# Patient Record
Sex: Female | Born: 1945 | Race: White | Hispanic: No | Smoking: Never smoker
Health system: Southern US, Community
[De-identification: ages and names within clinical notes are randomized; demographics above are authoritative.]

## PROBLEM LIST (undated history)

## (undated) DIAGNOSIS — Z9889 Other specified postprocedural states: Secondary | ICD-10-CM

## (undated) DIAGNOSIS — C801 Malignant (primary) neoplasm, unspecified: Secondary | ICD-10-CM

## (undated) DIAGNOSIS — I10 Essential (primary) hypertension: Secondary | ICD-10-CM

## (undated) HISTORY — PX: CERVICAL SPINE SURGERY: SHX589

## (undated) HISTORY — PX: HAMMER TOE SURGERY: SHX385

## (undated) HISTORY — PX: KNEE SURGERY: SHX244

## (undated) HISTORY — PX: HIP SURGERY: SHX245

---

## 2015-10-14 DIAGNOSIS — M179 Osteoarthritis of knee, unspecified: Secondary | ICD-10-CM | POA: Insufficient documentation

## 2015-10-14 DIAGNOSIS — M1710 Unilateral primary osteoarthritis, unspecified knee: Secondary | ICD-10-CM | POA: Insufficient documentation

## 2017-11-09 DIAGNOSIS — M2011 Hallux valgus (acquired), right foot: Secondary | ICD-10-CM | POA: Insufficient documentation

## 2018-02-05 DIAGNOSIS — M79671 Pain in right foot: Secondary | ICD-10-CM | POA: Insufficient documentation

## 2018-12-10 ENCOUNTER — Ambulatory Visit (INDEPENDENT_AMBULATORY_CARE_PROVIDER_SITE_OTHER): Payer: Medicare Other

## 2018-12-10 ENCOUNTER — Other Ambulatory Visit: Payer: Self-pay | Admitting: Podiatry

## 2018-12-10 ENCOUNTER — Other Ambulatory Visit: Payer: Self-pay

## 2018-12-10 ENCOUNTER — Encounter: Payer: Self-pay | Admitting: Podiatry

## 2018-12-10 ENCOUNTER — Ambulatory Visit (INDEPENDENT_AMBULATORY_CARE_PROVIDER_SITE_OTHER): Payer: Medicare Other | Admitting: Podiatry

## 2018-12-10 VITALS — BP 165/83 | HR 76 | Resp 16

## 2018-12-10 DIAGNOSIS — M205X1 Other deformities of toe(s) (acquired), right foot: Secondary | ICD-10-CM

## 2018-12-10 DIAGNOSIS — M79672 Pain in left foot: Secondary | ICD-10-CM | POA: Diagnosis not present

## 2018-12-10 DIAGNOSIS — M2041 Other hammer toe(s) (acquired), right foot: Secondary | ICD-10-CM

## 2018-12-10 DIAGNOSIS — M779 Enthesopathy, unspecified: Secondary | ICD-10-CM

## 2018-12-10 DIAGNOSIS — M79671 Pain in right foot: Secondary | ICD-10-CM

## 2018-12-10 DIAGNOSIS — M205X2 Other deformities of toe(s) (acquired), left foot: Secondary | ICD-10-CM

## 2018-12-10 DIAGNOSIS — S92309A Fracture of unspecified metatarsal bone(s), unspecified foot, initial encounter for closed fracture: Secondary | ICD-10-CM | POA: Insufficient documentation

## 2018-12-10 NOTE — Patient Instructions (Signed)
Bunion  A bunion is a bump on the base of the big toe that forms when the bones of the big toe joint move out of position. Bunions may be small at first, but they often get larger over time. They can make walking painful. What are the causes? A bunion may be caused by:  Wearing narrow or pointed shoes that force the big toe to press against the other toes.  Abnormal foot development that causes the foot to roll inward (pronate).  Changes in the foot that are caused by certain diseases, such as rheumatoid arthritis or polio.  A foot injury. What increases the risk? The following factors may make you more likely to develop this condition:  Wearing shoes that squeeze the toes together.  Having certain diseases, such as: ? Rheumatoid arthritis. ? Polio. ? Cerebral palsy.  Having family members who have bunions.  Being born with a foot deformity, such as flat feet or low arches.  Doing activities that put a lot of pressure on the feet, such as ballet dancing. What are the signs or symptoms? The main symptom of a bunion is a noticeable bump on the big toe. Other symptoms may include:  Pain.  Swelling around the big toe.  Redness and inflammation.  Thick or hardened skin on the big toe or between the toes.  Stiffness or loss of motion in the big toe.  Trouble with walking. How is this diagnosed? A bunion may be diagnosed based on your symptoms, medical history, and activities. You may have tests, such as:  X-rays. These allow your health care provider to check the position of the bones in your foot and look for damage to your joint. They also help your health care provider determine the severity of your bunion and the best way to treat it.  Joint aspiration. In this test, a sample of fluid is removed from the toe joint. This test may be done if you are in a lot of pain. It helps rule out diseases that cause painful swelling of the joints, such as arthritis. How is this  treated? Treatment depends on the severity of your symptoms. The goal of treatment is to relieve symptoms and prevent the bunion from getting worse. Your health care provider may recommend:  Wearing shoes that have a wide toe box.  Using bunion pads to cushion the affected area.  Taping your toes together to keep them in a normal position.  Placing a device inside your shoe (orthotics) to help reduce pressure on your toe joint.  Taking medicine to ease pain, inflammation, and swelling.  Applying heat or ice to the affected area.  Doing stretching exercises.  Surgery to remove scar tissue and move the toes back into their normal position. This treatment is rare. Follow these instructions at home: Managing pain, stiffness, and swelling   If directed, put ice on the painful area: ? Put ice in a plastic bag. ? Place a towel between your skin and the bag. ? Leave the ice on for 20 minutes, 2-3 times a day. Activity   If directed, apply heat to the affected area before you exercise. Use the heat source that your health care provider recommends, such as a moist heat pack or a heating pad. ? Place a towel between your skin and the heat source. ? Leave the heat on for 20-30 minutes. ? Remove the heat if your skin turns bright red. This is especially important if you are unable to feel pain,   heat, or cold. You may have a greater risk of getting burned.  Do exercises as told by your health care provider. General instructions  Support your toe joint with proper footwear, shoe padding, or taping as told by your health care provider.  Take over-the-counter and prescription medicines only as told by your health care provider.  Keep all follow-up visits as told by your health care provider. This is important. Contact a health care provider if your symptoms:  Get worse.  Do not improve in 2 weeks. Get help right away if you have:  Severe pain and trouble with walking. Summary  A  bunion is a bump on the base of the big toe that forms when the bones of the big toe joint move out of position.  Bunions can make walking painful.  Treatment depends on the severity of your symptoms.  Support your toe joint with proper footwear, shoe padding, or taping as told by your health care provider. This information is not intended to replace advice given to you by your health care provider. Make sure you discuss any questions you have with your health care provider. Document Released: 02/21/2005 Document Revised: 08/28/2017 Document Reviewed: 07/04/2017 Elsevier Patient Education  2020 [employer]  

## 2018-12-10 NOTE — Progress Notes (Signed)
   Subjective:    Patient ID: Tonya Jenkins, female    DOB: 1945-11-16, 73 y.o.   MRN: [id number]  HPI    Review of Systems  All other systems reviewed and are negative.      Objective:   Physical Exam        Assessment & Plan:

## 2018-12-16 NOTE — Progress Notes (Signed)
Subjective:   Patient ID: Tonya Jenkins, female   DOB: 73 y.o.   MRN: [id number]   HPI Patient presents states she had bunion surgery done last year and her foot still is bothering her on the right foot and she has moderate bunion deformity left and she is concerned about what could be done for this right one states it does deviate against her second toe.  Patient does not smoke likes to be active   Review of Systems  All other systems reviewed and are negative.       Objective:  Physical Exam Vitals signs and nursing note reviewed.  Constitutional:      Appearance: She is well-developed.  Pulmonary:     Effort: Pulmonary effort is normal.  Musculoskeletal: Normal range of motion.  Skin:    General: Skin is warm.  Neurological:     Mental Status: She is alert.     Neurovascular status found to be intact muscle strength found to be adequate range of motion within normal limits with patient noted to have reduced range of motion first MPJ right with inflammation fluid around the big toe joint and moderate valgus deformity left with deviation of the hallux against second toe bilateral.  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

## 2018-12-28 ENCOUNTER — Other Ambulatory Visit: Payer: Self-pay

## 2018-12-28 DIAGNOSIS — Z20822 Contact with and (suspected) exposure to covid-19: Secondary | ICD-10-CM

## 2018-12-29 LAB — NOVEL CORONAVIRUS, NAA: SARS-CoV-2, NAA: NOT DETECTED

## 2019-01-07 ENCOUNTER — Other Ambulatory Visit: Payer: Self-pay

## 2019-01-07 ENCOUNTER — Encounter: Payer: Self-pay | Admitting: Podiatry

## 2019-01-07 ENCOUNTER — Ambulatory Visit (INDEPENDENT_AMBULATORY_CARE_PROVIDER_SITE_OTHER): Payer: Medicare Other | Admitting: Podiatry

## 2019-01-07 DIAGNOSIS — M21619 Bunion of unspecified foot: Secondary | ICD-10-CM | POA: Diagnosis not present

## 2019-01-07 DIAGNOSIS — M205X1 Other deformities of toe(s) (acquired), right foot: Secondary | ICD-10-CM

## 2019-01-07 DIAGNOSIS — M7752 Other enthesopathy of left foot: Secondary | ICD-10-CM

## 2019-01-07 DIAGNOSIS — M779 Enthesopathy, unspecified: Secondary | ICD-10-CM

## 2019-01-07 NOTE — Progress Notes (Signed)
Subjective:   Patient ID: Tonya Jenkins, female   DOB: 73 y.o.   MRN: [id number]   HPI Patient presents stating that the right great toe still really bothering her and the injection we did give her temporary relief but it is reoccurred and it feels like she just cannot bend the toe.  She did have surgery done in [place] 1 year ago   ROS      Objective:  Physical Exam  Neurovascular status intact with patient found to have discomfort more on the medial side of the joint of the right first MPJ with reduced range of motion and history of surgery     Assessment:  Inflammatory capsulitis with probable hallux limitus deformity right with undercorrected bunion done in [place] 1 year ago     Plan:  H&P reviewed condition and today on trying the medial injection 3 mg Kenalog 5 mg Xylocaine discussed the possibility that we may end up having to do a fusion of the first MPJ and did place into a graphite insole along with over-the-counter insole to try to provide for better support.  Patient will be seen back for [place] to recheck again in the next several weeks or earlier if any issues were to occur

## 2019-02-05 ENCOUNTER — Telehealth: Payer: Self-pay | Admitting: *Deleted

## 2019-02-05 NOTE — Telephone Encounter (Signed)
I informed pt that if she asked Dr. Paulla Dolly at her office visit, he would have it available for her to pick up at checkout and take to the [facility]. Pt states she has printed up and completed the paperwork and will bring to appt.

## 2019-02-05 NOTE — Telephone Encounter (Signed)
Pt states she has an appt tomorrow with Dr. Paulla Dolly and would like a handicap placard.

## 2019-02-06 ENCOUNTER — Ambulatory Visit (INDEPENDENT_AMBULATORY_CARE_PROVIDER_SITE_OTHER): Payer: Medicare Other | Admitting: Podiatry

## 2019-02-06 ENCOUNTER — Other Ambulatory Visit: Payer: Self-pay

## 2019-02-06 ENCOUNTER — Encounter: Payer: Self-pay | Admitting: Podiatry

## 2019-02-06 DIAGNOSIS — G629 Polyneuropathy, unspecified: Secondary | ICD-10-CM

## 2019-02-06 DIAGNOSIS — M779 Enthesopathy, unspecified: Secondary | ICD-10-CM | POA: Diagnosis not present

## 2019-02-06 DIAGNOSIS — M205X1 Other deformities of toe(s) (acquired), right foot: Secondary | ICD-10-CM

## 2019-02-06 MED ORDER — GABAPENTIN 100 MG PO CAPS: 100.0000 mg | ORAL_CAPSULE | Freq: Every day | ORAL | 3 refills | Status: DC

## 2019-02-06 NOTE — Progress Notes (Signed)
Subjective:   Patient ID: Tonya Jenkins, female   DOB: 73 y.o.   MRN: [id number]   HPI Patient states my foot is some better but I do get at nighttime burning in it and it can wake me up and it seems like that is when I am having my most problems   ROS      Objective:  Physical Exam  Neurovascular status intact with patient's right first MPJ not showing current crepitus with patient noted to have moderate inflammation around the first MPJ and what appears to be more of a vague nerve type pain which may be neuropathy     Assessment:  Difficult to say pain whether it is related to inflammation or possible neuropathic change     Plan:  H&P reviewed condition and at this point I did go ahead and I am going to start her on low-dose gabapentin just at night to see if it helps her with sleep and to stop some of the burning pain she is experiencing do not recommend current surgery but that may be necessary at one point in future and I reviewed that with her

## 2019-03-07 ENCOUNTER — Other Ambulatory Visit: Payer: Self-pay

## 2019-03-07 ENCOUNTER — Emergency Department (HOSPITAL_COMMUNITY)
Admission: EM | Admit: 2019-03-07 | Discharge: 2019-03-07 | Disposition: A | Discharge status: Home / Self Care | Payer: No Typology Code available for payment source | Attending: Emergency Medicine | Admitting: Emergency Medicine

## 2019-03-07 ENCOUNTER — Emergency Department (HOSPITAL_COMMUNITY): Payer: No Typology Code available for payment source

## 2019-03-07 ENCOUNTER — Encounter (HOSPITAL_COMMUNITY): Payer: Self-pay | Admitting: Emergency Medicine

## 2019-03-07 DIAGNOSIS — Z888 Allergy status to other drugs, medicaments and biological substances status: Secondary | ICD-10-CM | POA: Diagnosis not present

## 2019-03-07 DIAGNOSIS — R0789 Other chest pain: Secondary | ICD-10-CM | POA: Diagnosis not present

## 2019-03-07 DIAGNOSIS — M542 Cervicalgia: Secondary | ICD-10-CM | POA: Insufficient documentation

## 2019-03-07 DIAGNOSIS — Z880 Allergy status to penicillin: Secondary | ICD-10-CM | POA: Insufficient documentation

## 2019-03-07 DIAGNOSIS — I10 Essential (primary) hypertension: Secondary | ICD-10-CM | POA: Diagnosis not present

## 2019-03-07 DIAGNOSIS — R519 Headache, unspecified: Secondary | ICD-10-CM | POA: Diagnosis not present

## 2019-03-07 DIAGNOSIS — M25552 Pain in left hip: Secondary | ICD-10-CM | POA: Insufficient documentation

## 2019-03-07 HISTORY — DX: Other specified postprocedural states: Z98.890

## 2019-03-07 HISTORY — DX: Essential (primary) hypertension: I10
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
## 2019-03-07 MED ORDER — BACLOFEN 10 MG PO TABS: 10.0000 mg | ORAL_TABLET | Freq: Three times a day (TID) | ORAL | 0 refills | Status: DC

## 2019-03-07 MED ORDER — ACETAMINOPHEN 325 MG PO TABS
650.0000 mg | ORAL_TABLET | Freq: Once | ORAL | Status: AC
  Administered 2019-03-07: 650 mg via ORAL
  Filled 2019-03-07: qty 2

## 2019-03-07 MED ORDER — DIAZEPAM 2 MG PO TABS
2.0000 mg | ORAL_TABLET | Freq: Once | ORAL | Status: AC
  Administered 2019-03-07: 2 mg via ORAL
  Filled 2019-03-07: qty 1
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
## 2019-03-07 NOTE — Discharge Instructions (Signed)
Please refer to the attached instructions 

## 2019-06-14 ENCOUNTER — Ambulatory Visit: Payer: Self-pay | Admitting: *Deleted

## 2019-06-14 NOTE — Telephone Encounter (Signed)
Pt called to inquire information regarding getting 2nd COVID vaccine while she is having throat discomfort, chest tightness, and fatigue; the pt says it could be related to the pollen; she does not have a PCP; the pt was advised to #1 seek medical evaluation at [facility] or ED; and #2 in order to have vaccine she must be able to do the following: ALL conditions listed below are met: - at least 10 days since symptoms onset - AND 3 consecutive days fever free without antipyretics (acetaminophen [Tylenol] or ibuprofen [Advil]) - AND improvement in respiratory symptoms   she verbalized understanding. -  Reason for Disposition . Question about upcoming scheduled test, no triage required and triager able to answer question  Answer Assessment - Initial Assessment Questions 1. REASON FOR CALL or QUESTION: "What is your reason for calling today?" or "How can I best help you?" or "What question do you have that I can help answer?"     2nd COVID vaccine and testing  Protocols used: INFORMATION ONLY CALL-A-AH

## 2019-12-28 ENCOUNTER — Emergency Department (HOSPITAL_COMMUNITY)
Admission: EM | Admit: 2019-12-28 | Discharge: 2019-12-28 | Disposition: A | Discharge status: Home / Self Care | Payer: Medicare Other | Attending: Emergency Medicine | Admitting: Emergency Medicine

## 2019-12-28 ENCOUNTER — Encounter (HOSPITAL_COMMUNITY): Payer: Self-pay

## 2019-12-28 ENCOUNTER — Emergency Department (HOSPITAL_COMMUNITY): Payer: Medicare Other

## 2019-12-28 ENCOUNTER — Other Ambulatory Visit: Payer: Self-pay

## 2019-12-28 DIAGNOSIS — M4726 Other spondylosis with radiculopathy, lumbar region: Secondary | ICD-10-CM

## 2019-12-28 DIAGNOSIS — R200 Anesthesia of skin: Secondary | ICD-10-CM | POA: Insufficient documentation

## 2019-12-28 DIAGNOSIS — R531 Weakness: Secondary | ICD-10-CM | POA: Diagnosis not present

## 2019-12-28 DIAGNOSIS — M5441 Lumbago with sciatica, right side: Secondary | ICD-10-CM | POA: Diagnosis not present

## 2019-12-28 DIAGNOSIS — I10 Essential (primary) hypertension: Secondary | ICD-10-CM | POA: Insufficient documentation

## 2019-12-28 DIAGNOSIS — M545 Low back pain, unspecified: Secondary | ICD-10-CM | POA: Diagnosis present

## 2019-12-28 IMAGING — CR DG HIP (WITH OR WITHOUT PELVIS) 2-3V*R*
3 series · 3 of 3 positions shown · non-contrast
Comparison: None.

CLINICAL DATA: Fall, hip pain

EXAM:
DG HIP (WITH OR WITHOUT PELVIS) 2-3V RIGHT

[t pelvis ap]
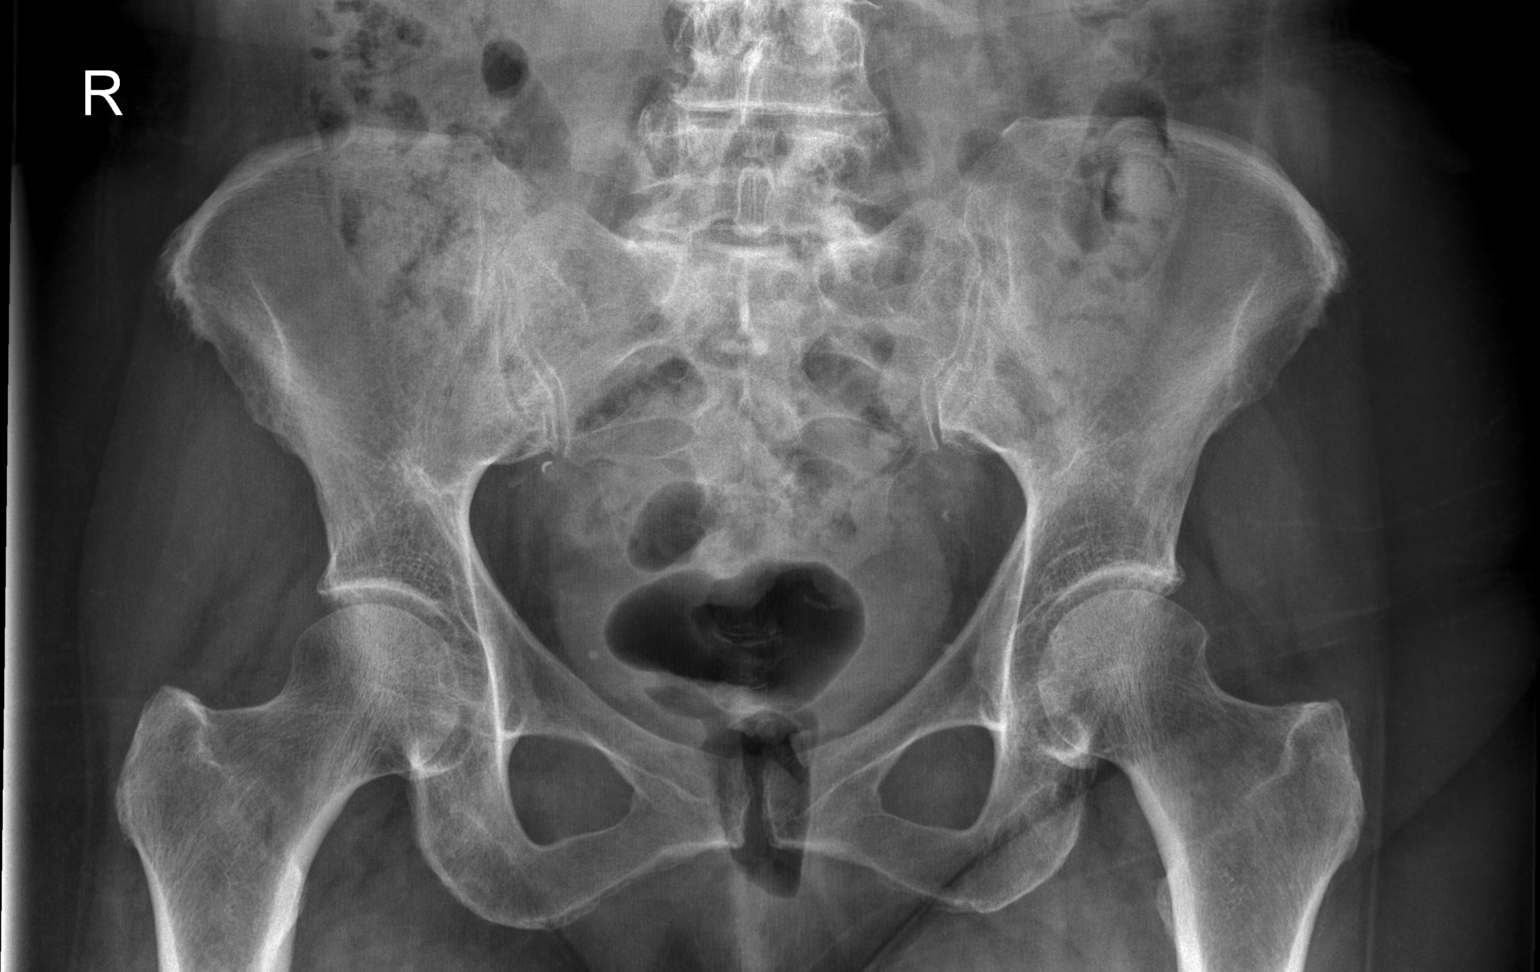

[t hip ap right]
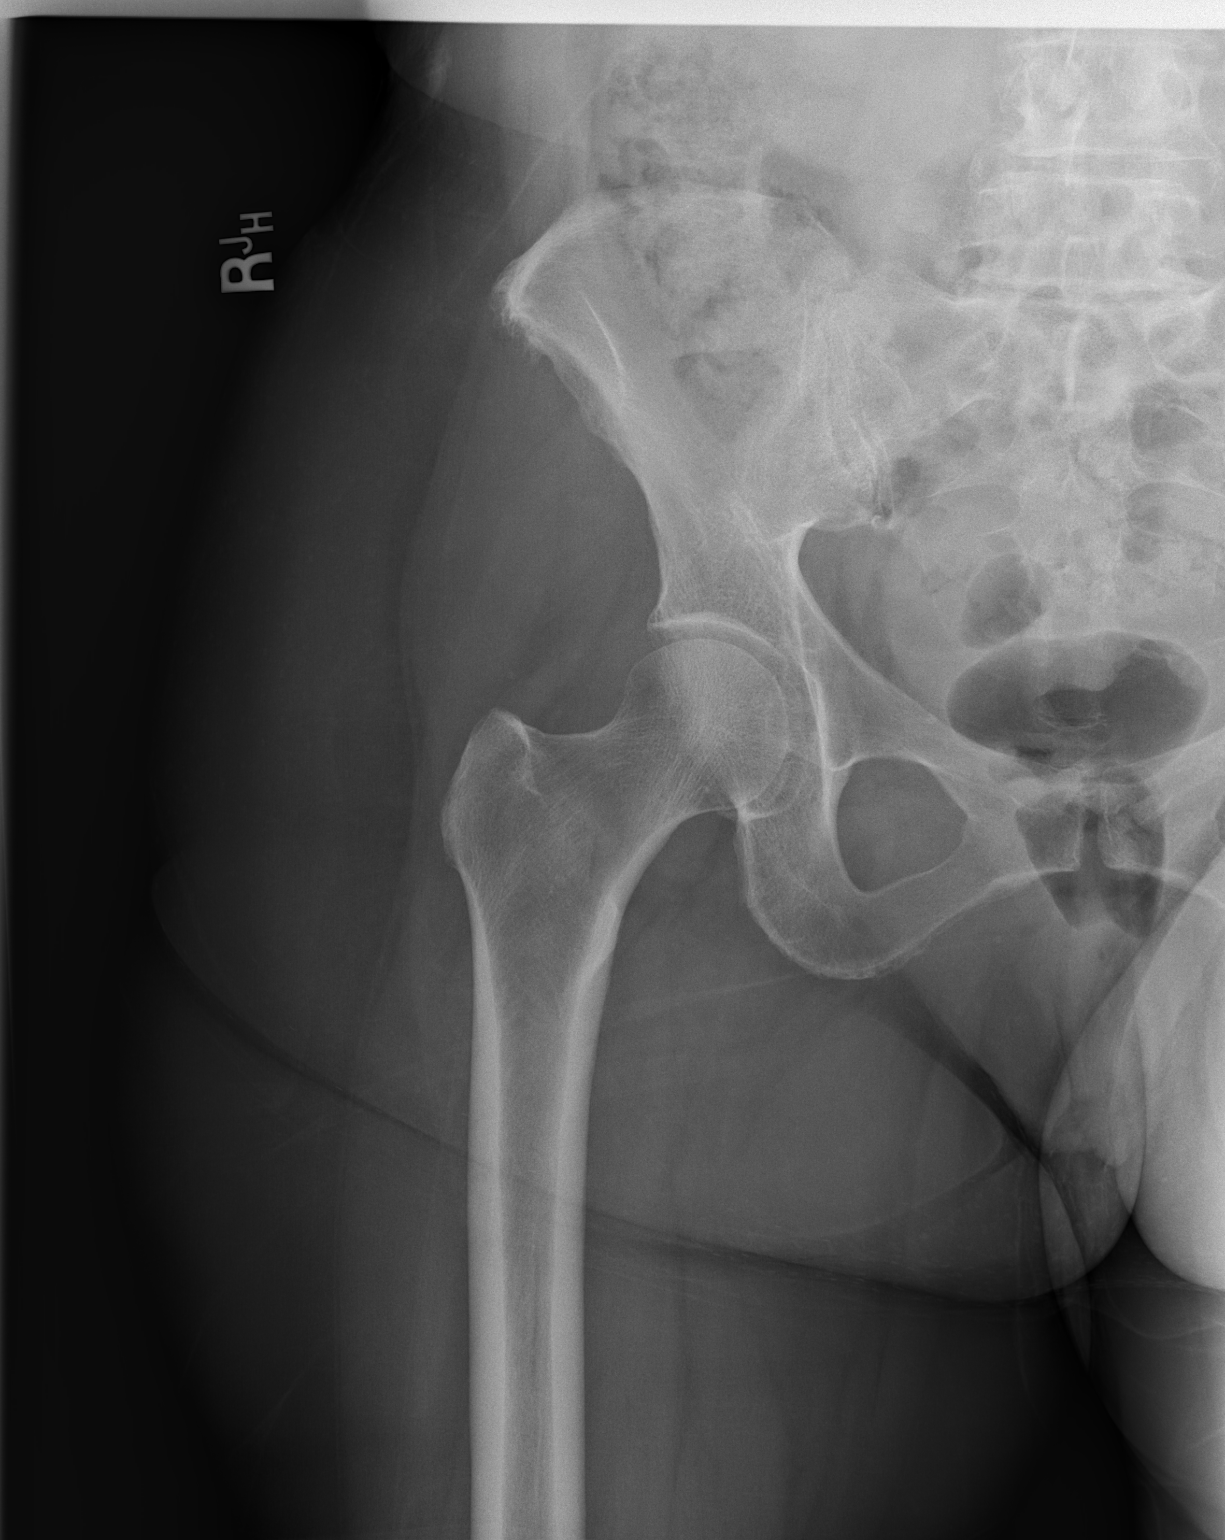

[t hip frog leg right]
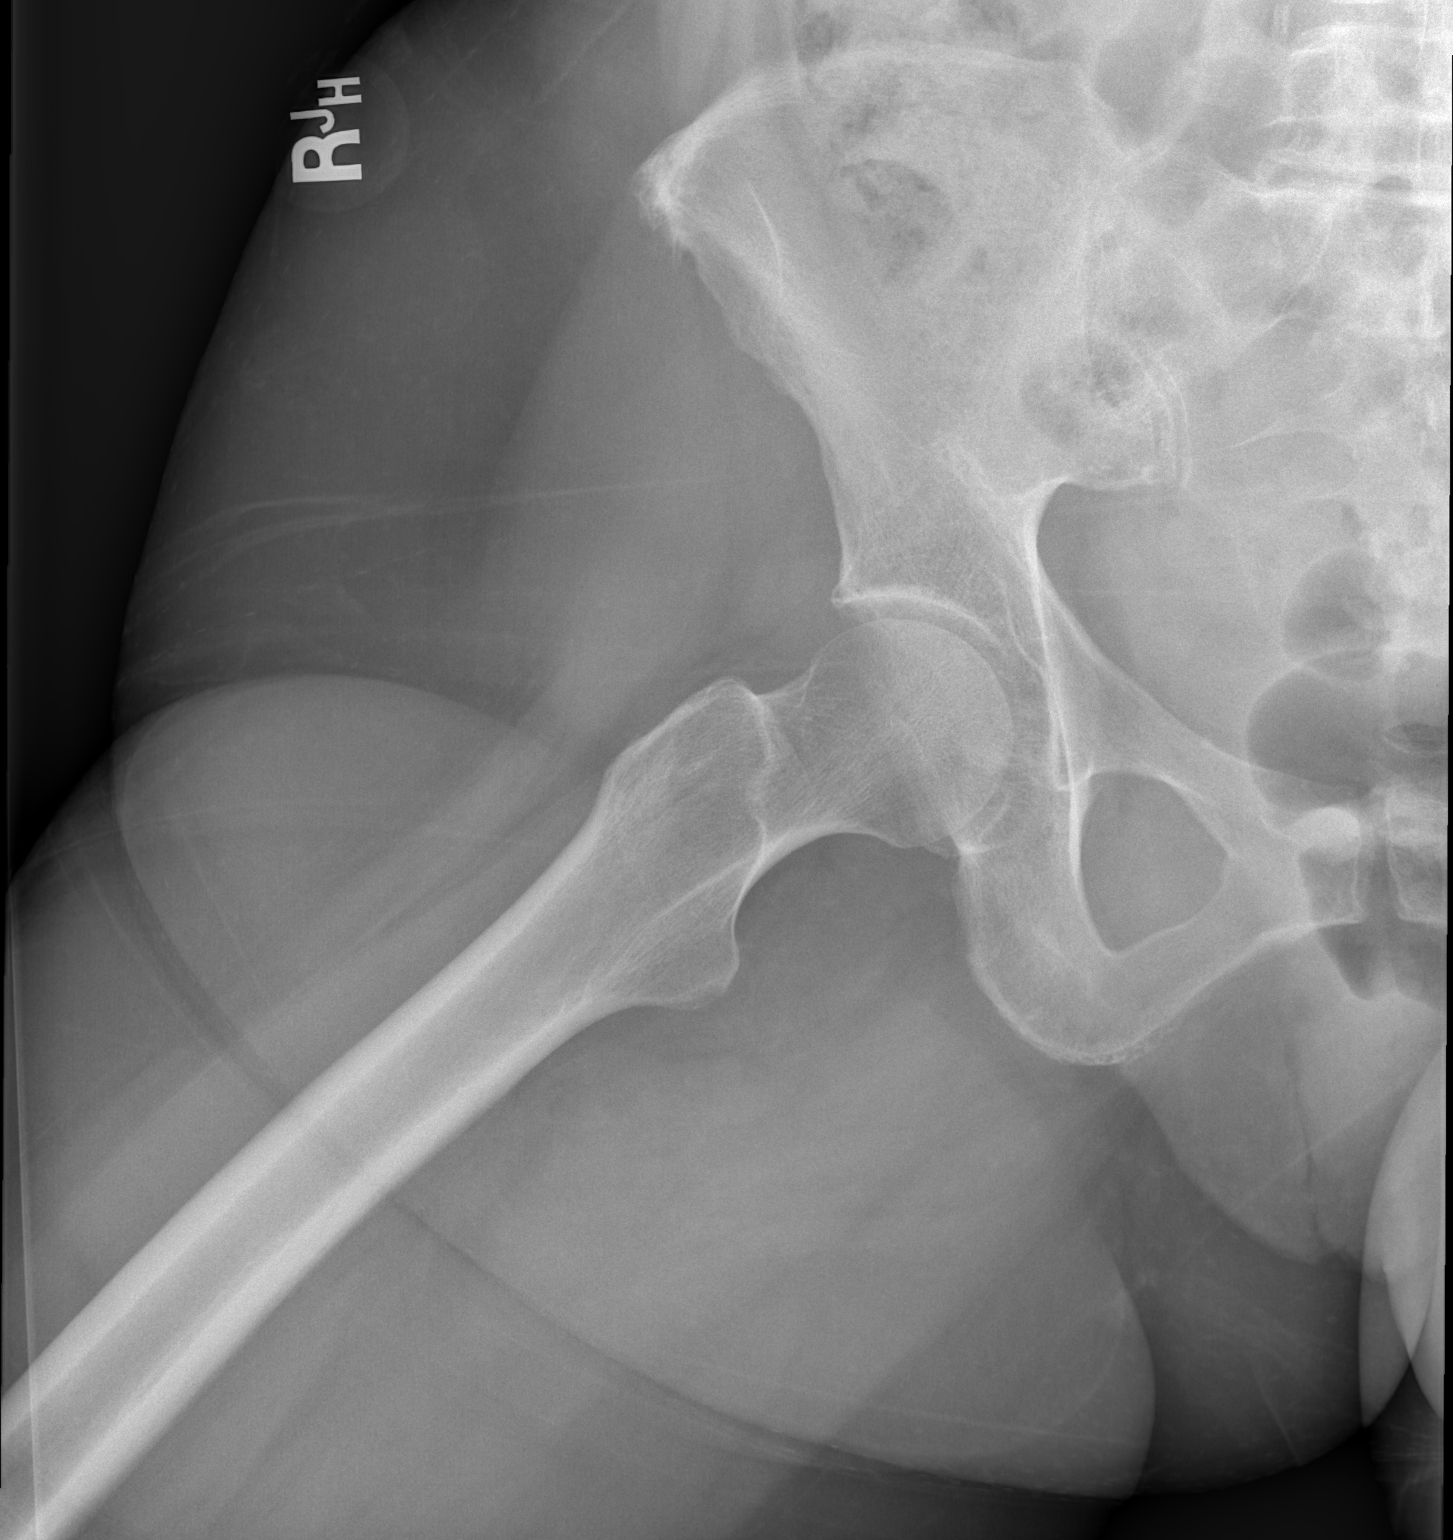

[3 of 3 positions shown; findings below may reference images not displayed]

FINDINGS: There is no evidence of hip fracture or dislocation. Moderate
bilateral hip osteoarthritis is seen with superior joint space loss
and marginal osteophyte formation. The sacroiliac joints appear to
be intact. Degenerative changes in the lower lumbar spine.
IMPRESSION: No acute fracture seen.

## 2019-12-28 IMAGING — CR DG LUMBAR SPINE COMPLETE 4+V
5 series · 5 of 5 positions shown · non-contrast
Comparison: None.

CLINICAL DATA: Back pain after fall

EXAM:
LUMBAR SPINE - COMPLETE 4+ VIEW

[t lumbar spine lat (1 of 2)]
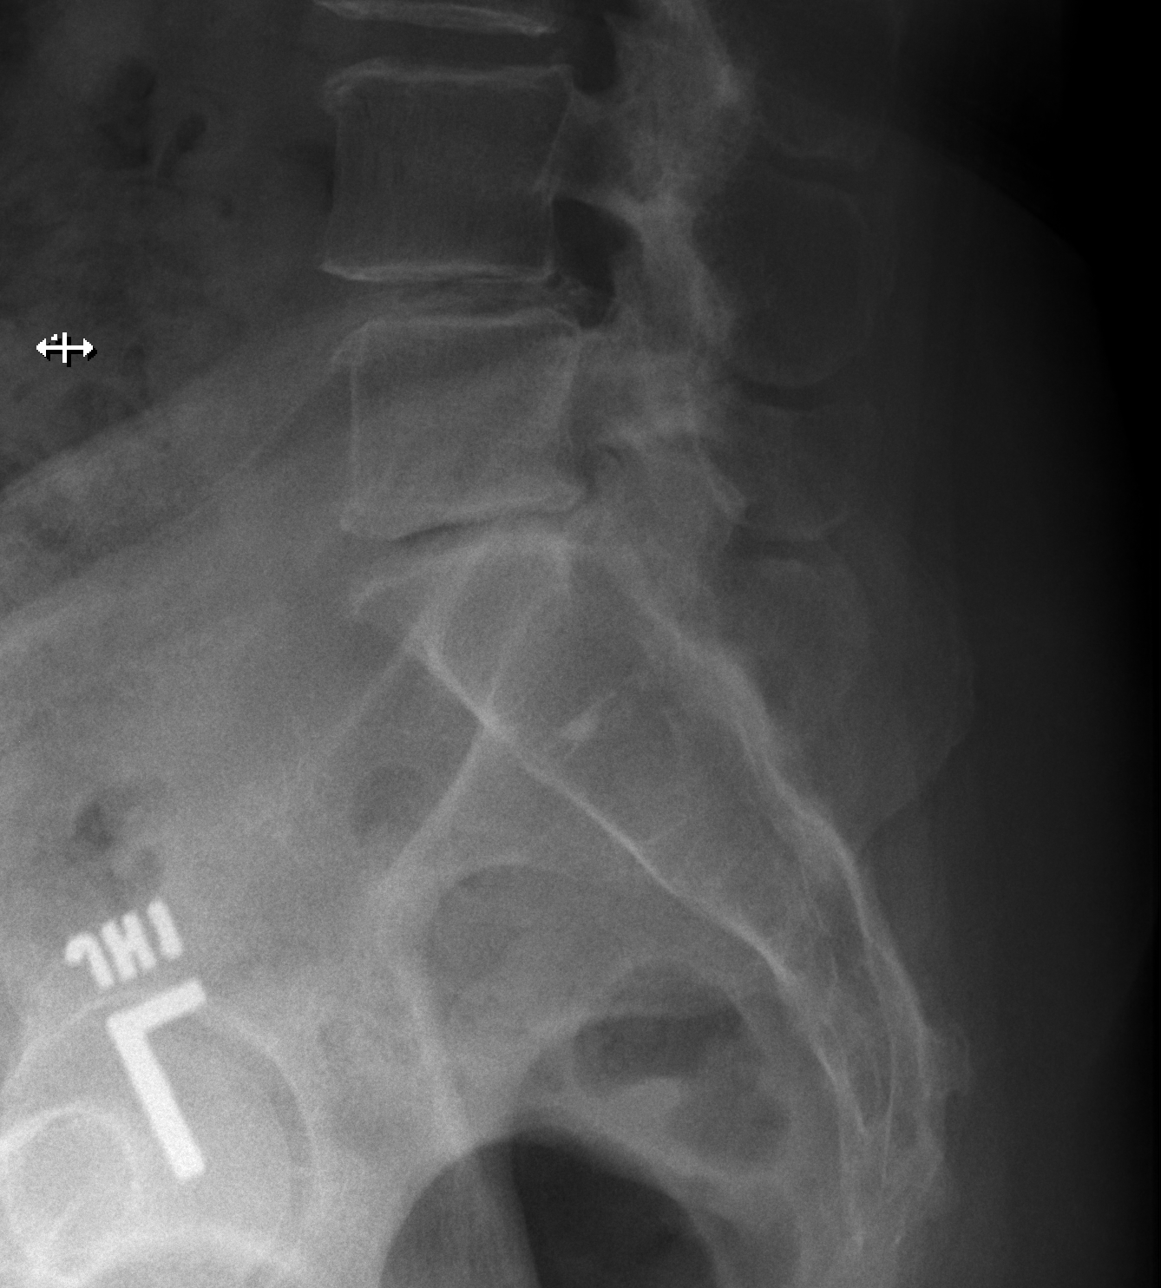

[t lumbar spine lat (2 of 2)]
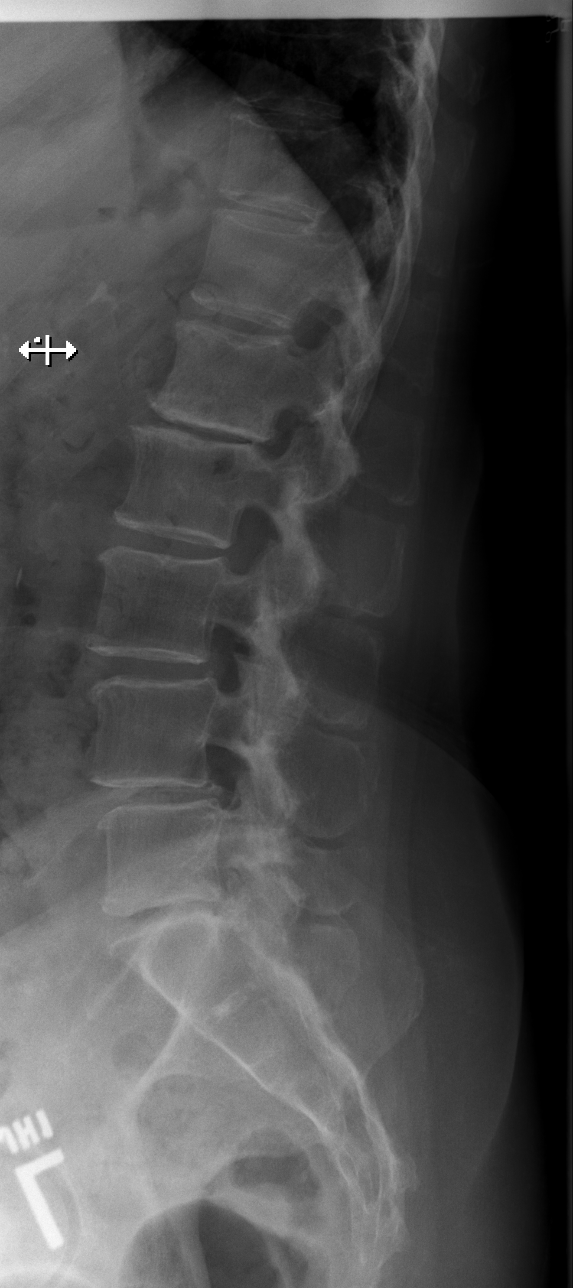

[t lumbar spine ap]
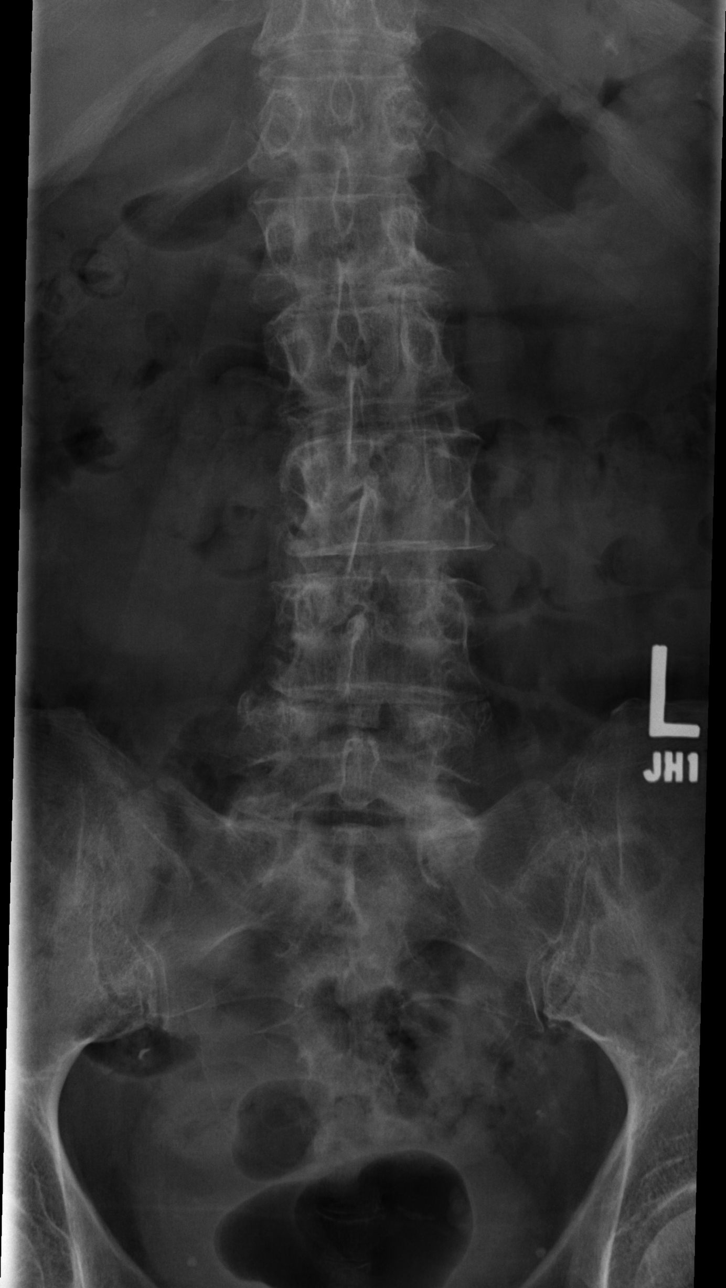

[t lumbar spine obl (1 of 2)]
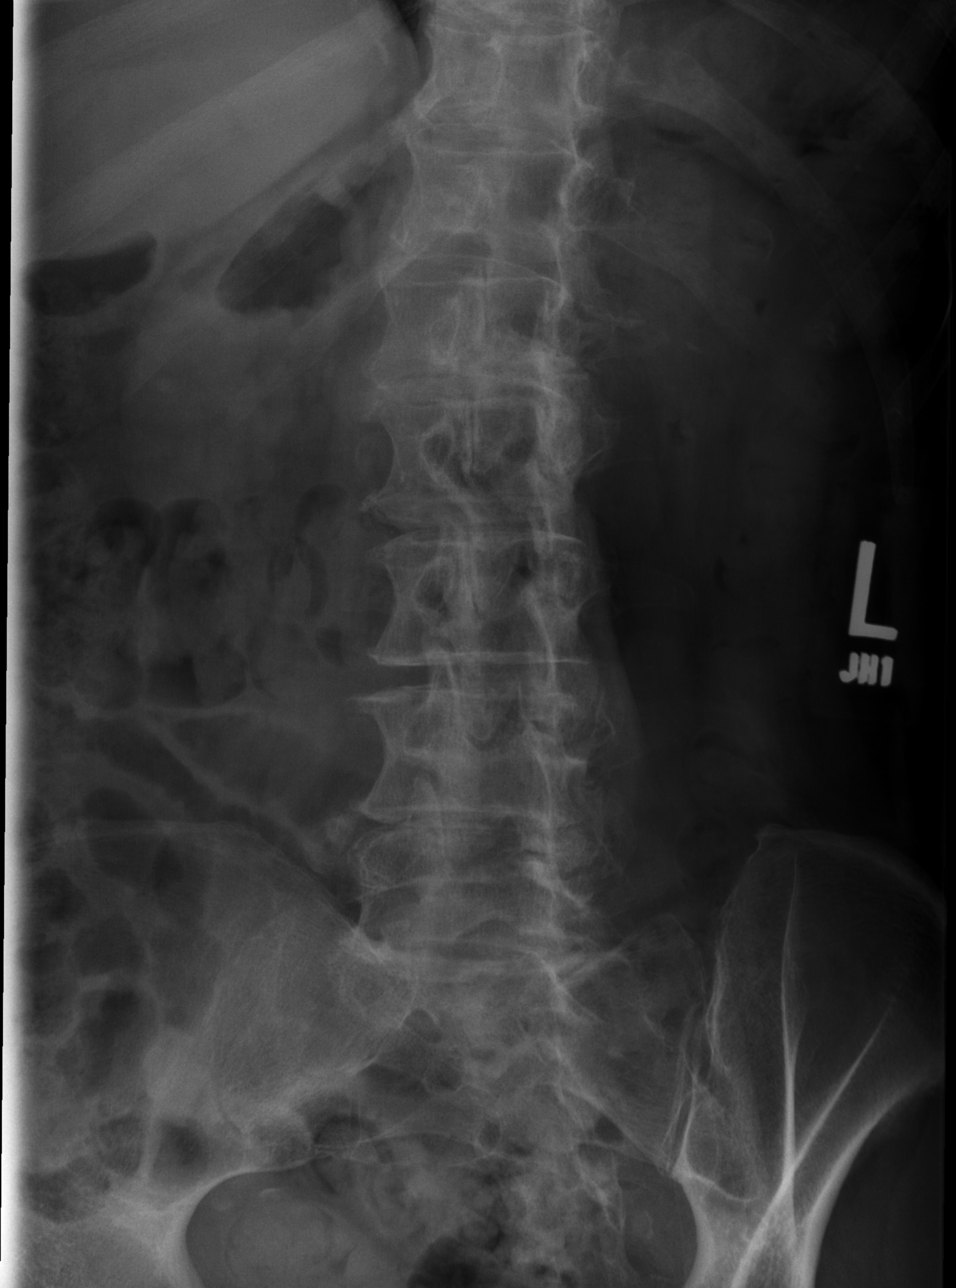

[t lumbar spine obl (2 of 2)]
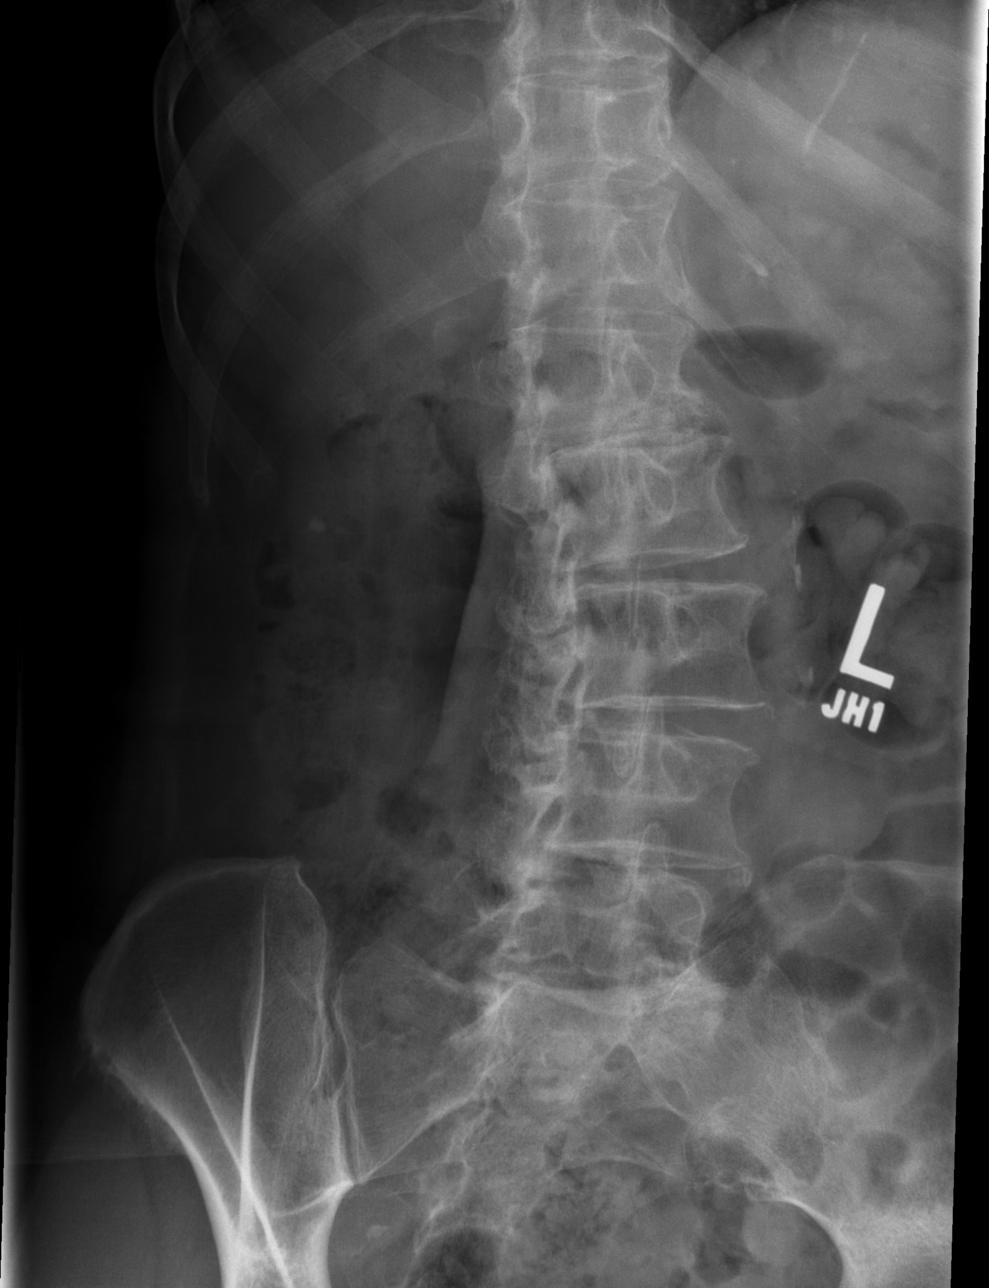

[5 of 5 positions shown; findings below may reference images not displayed]

FINDINGS: There is no evidence of lumbar spine fracture. Alignment is normal.
Disc height loss with facet arthropathy and vacuum phenomenon are
most notable at L5-S1.
IMPRESSION: No acute fracture or malalignment.

## 2019-12-28 IMAGING — MR MR LUMBAR SPINE W/O CM
4 of 5 series · 32 of 48 positions shown · non-contrast
Comparison: Lumbar radiography from earlier today

CLINICAL DATA: Low back pain for over 6 weeks

EXAM:
MRI LUMBAR SPINE WITHOUT CONTRAST
TECHNIQUE: Multiplanar, multisequence MR imaging of the lumbar spine was
performed. No intravenous contrast was administered.

[Series 5: T2 · sagittal · 4.0mm · 0.69mm/px · 8 of 17 slices shown (1 of 2)]
[im 1/17]
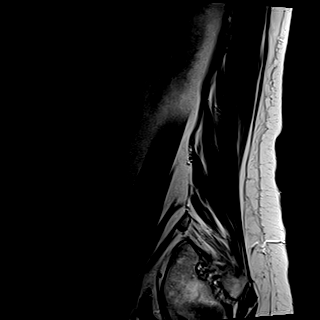
[im 3/17]
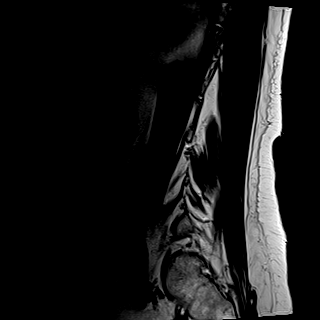
[im 5/17]
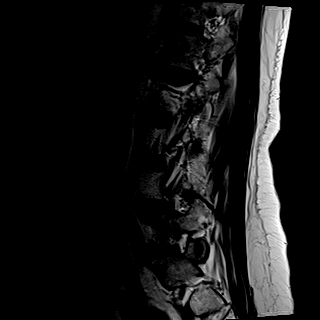
[im 7/17]
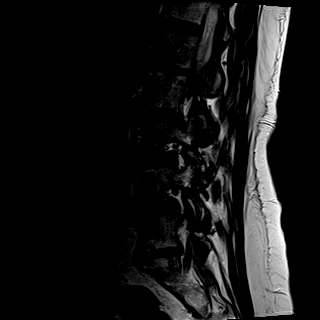
[im 10/17]
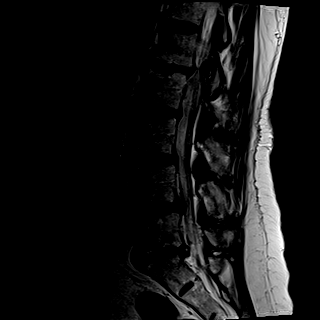
[im 12/17]
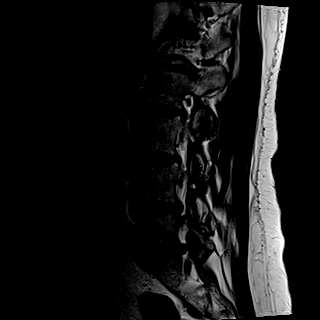
[im 14/17]
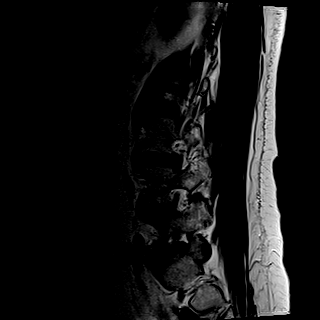
[im 17/17]
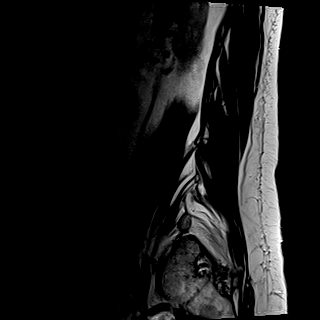

[Series 7: T2 · axial · 4.0mm · 0.62mm/px · z∈[-113,+41]mm · 9 of 31 slices shown (2 of 2)]
[im 1/31]
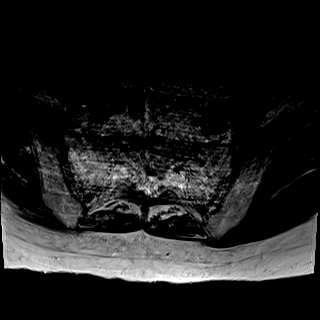
[im 6/31]
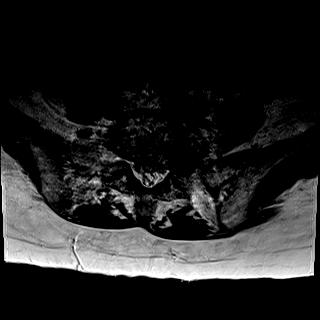
[im 11/31]
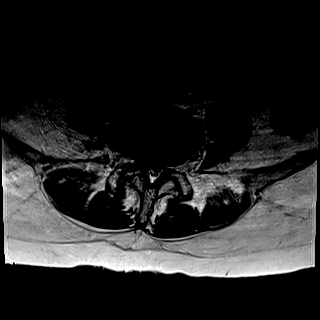
[im 13/31]
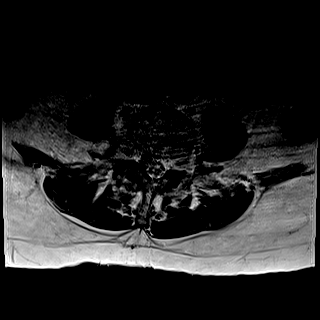
[im 16/31]
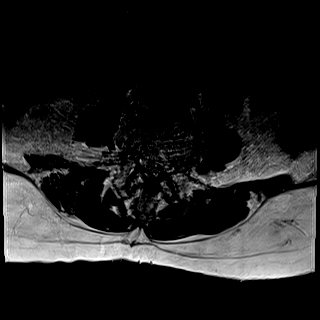
[im 18/31]
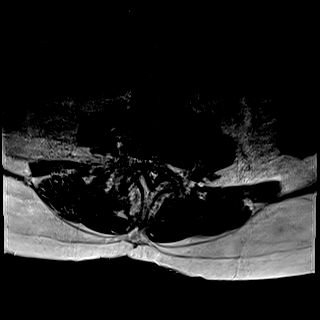
[im 21/31]
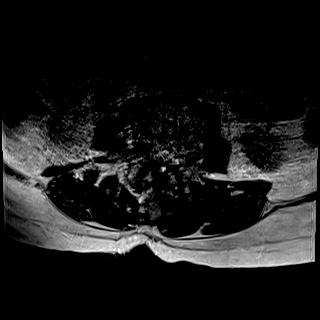
[im 26/31]
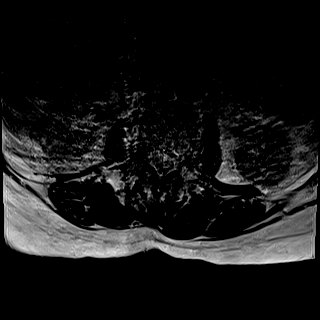
[im 31/31]
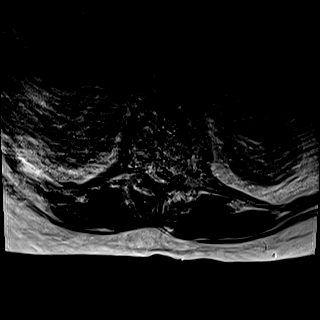

[Series 8: T1 · sagittal · 4.0mm · 0.69mm/px · 7 of 17 slices shown (1 of 2)]
[im 1/17]
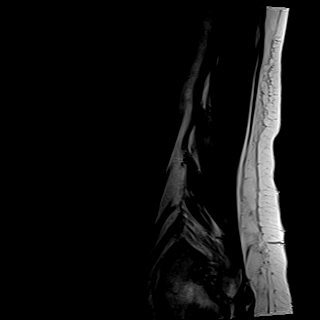
[im 3/17]
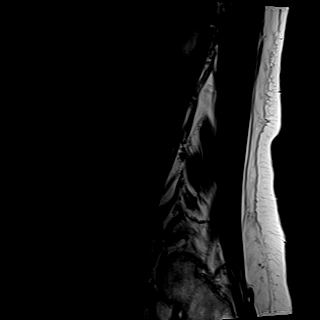
[im 6/17]
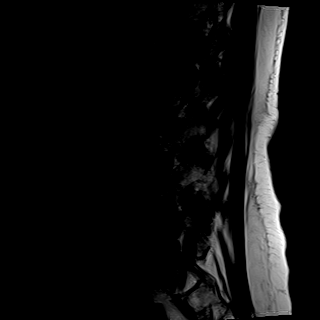
[im 9/17]
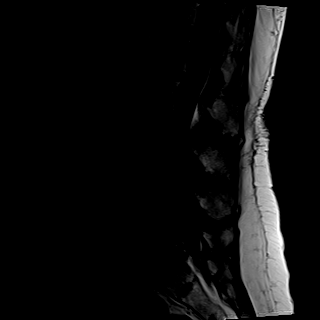
[im 11/17]
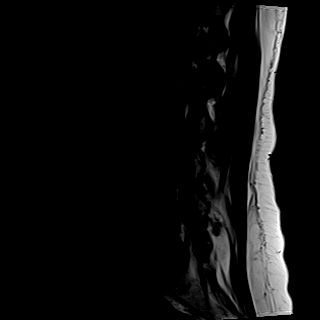
[im 14/17]
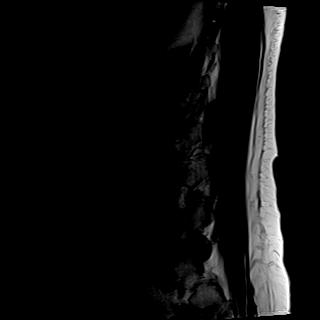
[im 17/17]
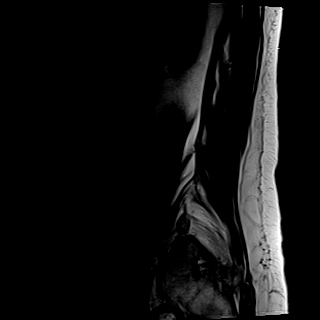

[Series 9: T1 · axial · 4.0mm · 0.39mm/px · z∈[-113,+16]mm · 8 of 31 slices shown (2 of 2)]
[im 1/31]
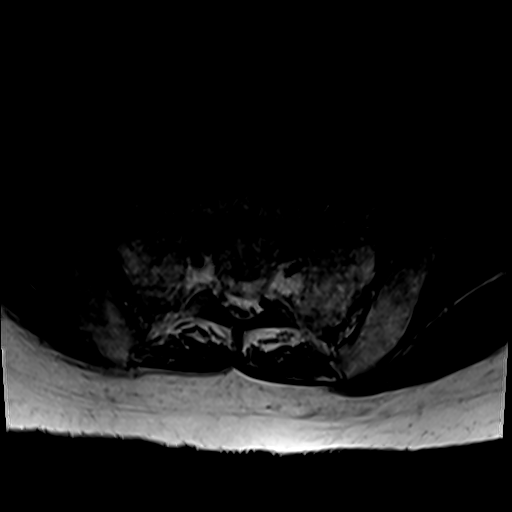
[im 6/31]
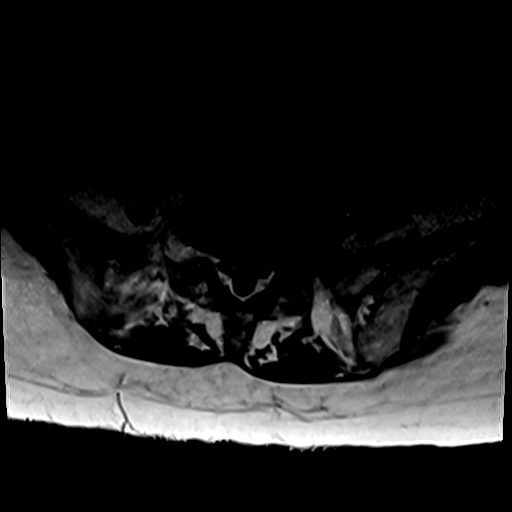
[im 11/31]
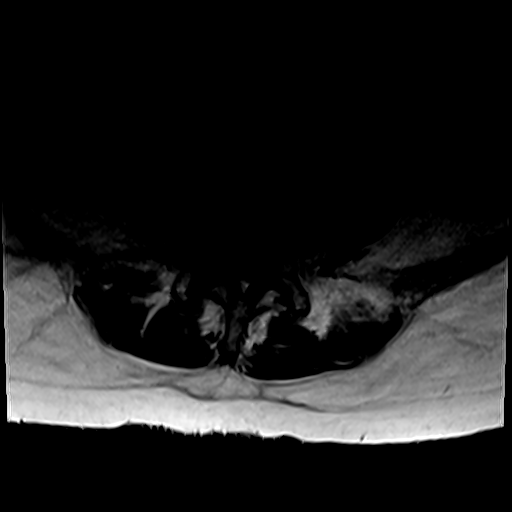
[im 13/31]
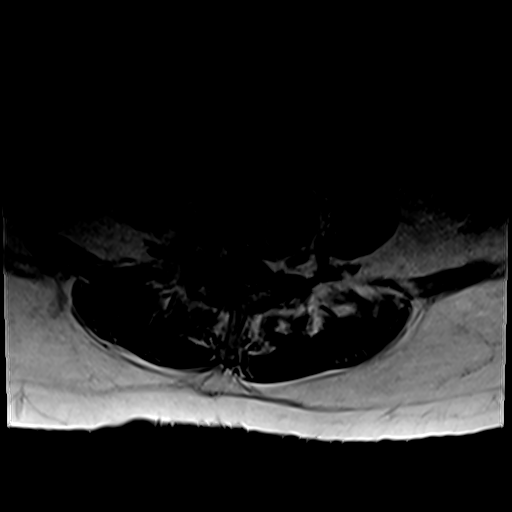
[im 16/31]
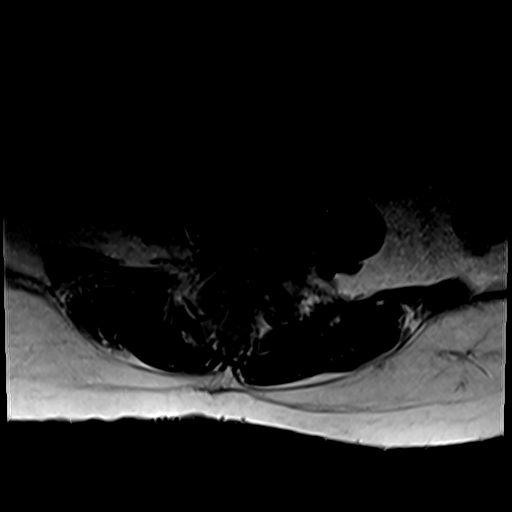
[im 18/31]
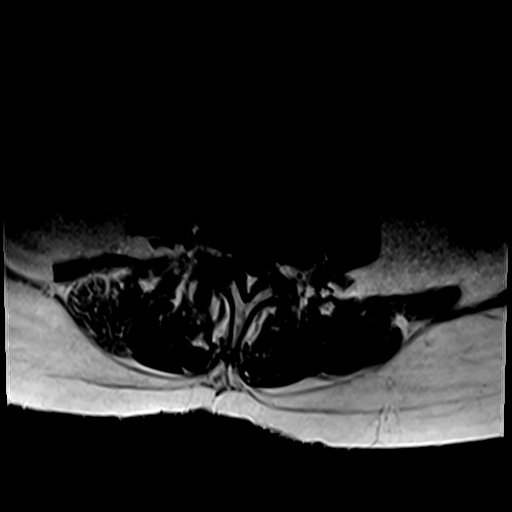
[im 21/31]
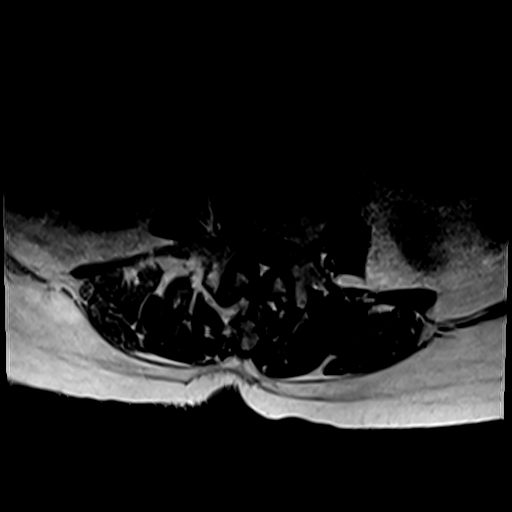
[im 26/31]
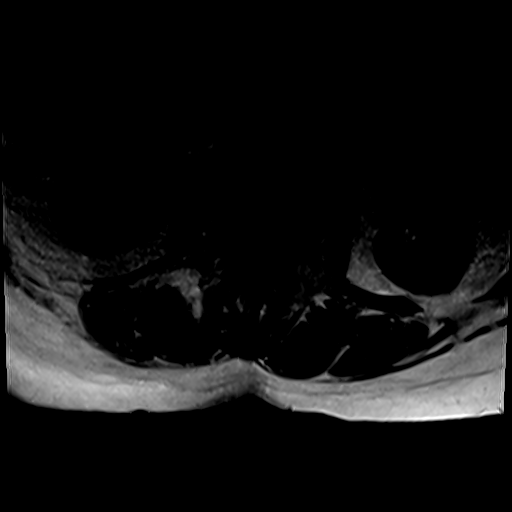

[32 of 48 positions shown; findings below may reference images not displayed]

FINDINGS: Segmentation:  5 lumbar type vertebrae

Alignment: Degenerative grade 1 anterolisthesis at L3-4 and L4-5.
Mild levocurvature.

Vertebrae:  No fracture, evidence of discitis, or bone lesion.

Conus medullaris and cauda equina: Conus extends to the L2 level.
Conus and cauda equina appear normal.

Paraspinal and other soft tissues: Negative

Disc levels:

T12- L1: Unremarkable.

L1-L2: Disc narrowing and right eccentric bulging/endplate ridging.
Degenerative facet spurring. Noncompressive bilateral foraminal
narrowing.

L2-L3: Disc narrowing and bulging with degenerative facet spurring
asymmetric to the right. No evidence of impingement

L3-L4: Facet osteoarthritis with moderate spurring and mild
anterolisthesis. The disc is bulging and there is moderate spinal
stenosis. Right more than left foraminal narrowing with right
foraminal protrusion up lifting the L3 nerve root.

L4-L5: Disc narrowing and bulging with right paracentral protrusion.
Degenerative facet spurring with mild anterolisthesis. Moderate to
advanced spinal stenosis. Patent foramina

L5-S1:Disc narrowing and bulging with posterior annular
ossification. Minor facet spurring. The canal and foramina are
patent with mild to moderate left foraminal narrowing mainly due to
disc height loss and bulging disc.
IMPRESSION: 1. Significant motion artifact on axial acquisition.
2. History of fall.  No acute fracture.
3. Multilevel disc and facet degeneration with mild scoliosis and
L3-4, L4-5 anterolisthesis.
4. Spinal stenosis that is moderate at L3-4 and moderate to advanced
at L4-5. At L4-5 there is a superimposed right paracentral
protrusion with asymmetric right-sided nerve root impingement.
5. Patent foramina with mild to moderate narrowings noted above.

## 2019-12-28 MED ORDER — TRAMADOL-ACETAMINOPHEN 37.5-325 MG PO TABS: 1.0000 | ORAL_TABLET | Freq: Four times a day (QID) | ORAL | 0 refills | Status: DC | PRN

## 2019-12-28 MED ORDER — TRAMADOL-ACETAMINOPHEN 37.5-325 MG PO TABS
1.0000 | ORAL_TABLET | Freq: Once | ORAL | Status: AC
  Administered 2019-12-28: 1 via ORAL
  Filled 2019-12-28: qty 1

## 2019-12-28 MED ORDER — DEXAMETHASONE SODIUM PHOSPHATE 10 MG/ML IJ SOLN
6.0000 mg | Freq: Once | INTRAMUSCULAR | Status: AC
  Administered 2019-12-28: 6 mg via INTRAMUSCULAR
  Filled 2019-12-28: qty 1

## 2019-12-28 MED ORDER — FENTANYL CITRATE (PF) 100 MCG/2ML IJ SOLN
50.0000 ug | Freq: Once | INTRAMUSCULAR | Status: AC
  Administered 2019-12-28: 50 ug via INTRAVENOUS
  Filled 2019-12-28: qty 2

## 2019-12-28 MED ORDER — IBUPROFEN 200 MG PO TABS
400.0000 mg | ORAL_TABLET | Freq: Once | ORAL | Status: AC
  Administered 2019-12-28: 400 mg via ORAL
  Filled 2019-12-28: qty 2

## 2019-12-28 NOTE — ED Provider Notes (Signed)
[place] DEPT Provider Note   CSN: [id number] Arrival date & time: 12/28/19  0100     History Chief Complaint  Patient presents with  . Fall    pt c/o falling 1 hr pta. pt c/o right leg pain and weakness x 6 wks. pt states she has a MRI today. pt c/o right lower back pain     Tonya Jenkins is a 74 y.o. female.  Patient is a 74 year old female who presents with back pain.  She has had a history of right-sided back pain radiating to her right buttocks and down her right leg for about 6 weeks.  It started after she lifted a tree that she was planting.  She said it is gotten worse over the last few days and today she was walking to the bathroom and felt like her right leg gave out.  She has a bruise on her right buttocks from where she fell.  She has tenderness to this area and worsening low back pain.  She says that today she started having some numbness in the right leg.  This was prior to the fall.  She does describe some weakness in the right leg which she says has been going on for the last 6 weeks.  She denies any loss of bowel or bladder control.  She has been seen by a spine specialist at [facility].  She is scheduled to have an MRI this morning at 7 AM but she does not feel like she can try to get the MRI because of the worsening pain.  She has no fevers.  No other injuries from the fall.  She has been getting chiropractic treatments and also using CBD oil and a homeopathic analgesic called T remedy.  She is reluctant to take medications but she says that her pain is worsened to the point where she needs something for pain.        Past Medical History:  Diagnosis Date  . History of hip surgery   . Hx of neck surgery   . Hypertension     Patient Active Problem List   Diagnosis Date Noted  . Closed fracture of metatarsal bone 12/10/2018  . Right foot pain 02/05/2018  . Hallux valgus of right foot 11/09/2017  . Osteoarthritis of knee  10/14/2015    Past Surgical History:  Procedure Laterality Date  . CERVICAL SPINE SURGERY    . HAMMER TOE SURGERY    . HIP SURGERY    . KNEE SURGERY       OB History   No obstetric history on file.     History reviewed. No pertinent family history.  Social History   Tobacco Use  . Smoking status: Never Smoker  . Smokeless tobacco: Never Used  Substance Use Topics  . Alcohol use: Not Currently  . Drug use: Not Currently    Home Medications Prior to Admission medications   Medication Sig Start Date End Date Taking? Authorizing Provider  baclofen (LIORESAL) 10 MG tablet Take 1 tablet (10 mg total) by mouth 3 (three) times daily. 03/07/19   Etta Quill, NP  gabapentin (NEURONTIN) 100 MG capsule Take 1 capsule (100 mg total) by mouth at bedtime. Patient not taking: Reported on 12/28/2019 02/06/19   Wallene Huh, DPM    Allergies    Lexapro [escitalopram oxalate] and Penicillins  Review of Systems   Review of Systems  Constitutional: Negative for chills, diaphoresis, fatigue and fever.  HENT: Negative for congestion,  rhinorrhea and sneezing.   Eyes: Negative.   Respiratory: Negative for cough, chest tightness and shortness of breath.   Cardiovascular: Negative for chest pain and leg swelling.  Gastrointestinal: Negative for abdominal pain, blood in stool, diarrhea, nausea and vomiting.  Genitourinary: Negative for difficulty urinating, flank pain, frequency and hematuria.  Musculoskeletal: Positive for back pain. Negative for arthralgias.  Skin: Negative for rash.  Neurological: Positive for numbness. Negative for dizziness, speech difficulty, weakness and headaches.    Physical Exam Updated Vital Signs BP (!) 195/70   Pulse 65   Temp 98.1 F (36.7 C)   Resp 20   Ht 5' (1.524 m)   Wt 65.8 kg   SpO2 91%   BMI 28.32 kg/m   Physical Exam Constitutional:      Appearance: She is well-developed.  HENT:     Head: Normocephalic and atraumatic.  Eyes:      Pupils: Pupils are equal, round, and reactive to light.  Cardiovascular:     Rate and Rhythm: Normal rate and regular rhythm.     Heart sounds: Normal heart sounds.  Pulmonary:     Effort: Pulmonary effort is normal. No respiratory distress.     Breath sounds: Normal breath sounds. No wheezing or rales.  Chest:     Chest wall: No tenderness.  Abdominal:     General: Bowel sounds are normal.     Palpations: Abdomen is soft.     Tenderness: There is no abdominal tenderness. There is no guarding or rebound.  Musculoskeletal:        General: Normal range of motion.     Cervical back: Normal range of motion and neck supple.     Comments: There is some tenderness to the lower lumbar spine and to the right paraspinal area.  There are some pain over the right sciatic nerve.  She has negative straight leg raise.  She has some decreased sensation to the lateral aspect of the right foot.  Pedal pulses are intact.  She has normal motor function in the lower extremities.  Patellar reflexes intact.  Lymphadenopathy:     Cervical: No cervical adenopathy.  Skin:    General: Skin is warm and dry.     Findings: No rash.  Neurological:     Mental Status: She is alert and oriented to person, place, and time.     Sensory: Sensory deficit present.     Motor: Motor function is intact.     ED Results / Procedures / Treatments   Labs (all labs ordered are listed, but only abnormal results are displayed) Labs Reviewed - No data to display  EKG None  Radiology DG Lumbar Spine Complete  Result Date: 12/28/2019 CLINICAL DATA:  Back pain after fall EXAM: LUMBAR SPINE - COMPLETE 4+ VIEW COMPARISON:  None. FINDINGS: There is no evidence of lumbar spine fracture. Alignment is normal. Disc height loss with facet arthropathy and vacuum phenomenon are most notable at L5-S1. IMPRESSION: No acute fracture or malalignment. Electronically Signed   By: Prudencio Pair M.D.   On: 12/28/2019 03:12   DG Hip Unilat W or  Wo Pelvis 2-3 Views Right  Result Date: 12/28/2019 CLINICAL DATA:  Fall, hip pain EXAM: DG HIP (WITH OR WITHOUT PELVIS) 2-3V RIGHT COMPARISON:  None. FINDINGS: There is no evidence of hip fracture or dislocation. 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 The sacroiliac joints appear to be intact. Degenerative changes in the lower  lumbar spine. IMPRESSION: No acute fracture seen. Electronically Signed   By: Prudencio Pair M.D.   On: 12/28/2019 03:11    Procedures Procedures (including critical care time)  Medications Ordered in ED Medications  dexamethasone (DECADRON) injection 6 mg (6 mg Intramuscular Given 12/28/19 0218)  fentaNYL (SUBLIMAZE) injection 50 mcg (50 mcg Intravenous Given 12/28/19 0226)    ED Course  I have reviewed the triage vital signs and the nursing notes.  Pertinent labs & imaging results that were available during my care of the patient were reviewed by me and considered in my medical decision making (see chart for details).    MDM Rules/Calculators/A&P                          Patient presents with radicular right-sided low back pain.  She has had this for about 6 weeks but recently has gotten worse.  She now has some numbness in her leg and has had some weakness which is also been going on for about 6 weeks.  The numbness is new.  She does not have other suggestions of cauda equina.  No fevers.  Given her fall tonight, she did have x-rays of her lumbar spine and hip which show no acute abnormality.  These were reviewed by me.  She was given a dose of Decadron and fentanyl in the ED.  She is feeling better however she was scheduled to have an MRI this morning.  She does not feel that she is can be able to ambulate or drive to the appointment.  She request that we go ahead and do the MRI while she is here in the ED.  It is only a couple more hours until the MRI technician arrives and I feel that this  request is reasonable.  MRI was ordered and is still pending.  Pt turned over to oncoming EDP pending MRI. Final Clinical Impression(s) / ED Diagnoses Final diagnoses:  Acute right-sided low back pain with right-sided sciatica    Rx / DC Orders ED Discharge Orders    None       Malvin Johns, MD 12/28/19 [phone]

## 2019-12-28 NOTE — ED Notes (Addendum)
Pt states she has had issues with tingling in her right leg and has a MRI scheduled for this mooring. Pt states she fell and is c/o left lower back pain. Pt denies LOC

## 2019-12-28 NOTE — ED Notes (Signed)
Pt in bed resting  

## 2019-12-28 NOTE — ED Provider Notes (Signed)
7:15 AM-checkout from Dr. Tamera Punt to evaluate patient after MRI imaging, oriented to seek source of back and right-sided radicular pain.  DG Lumbar Spine Complete  Result Date: 12/28/2019 CLINICAL DATA:  Back pain after fall EXAM: LUMBAR SPINE - COMPLETE 4+ VIEW COMPARISON:  None. FINDINGS: There is no evidence of lumbar spine fracture. Alignment is normal. Disc height loss with facet arthropathy and vacuum phenomenon are most notable at L5-S1. IMPRESSION: No acute fracture or malalignment. Electronically Signed   By: Prudencio Pair M.D.   On: 12/28/2019 03:12   MR LUMBAR SPINE WO CONTRAST  Result Date: 12/28/2019 CLINICAL DATA:  Low back pain for over 6 weeks EXAM: MRI LUMBAR SPINE WITHOUT CONTRAST TECHNIQUE: Multiplanar, multisequence MR imaging of the lumbar spine was performed. No intravenous contrast was administered. COMPARISON:  Lumbar radiography from earlier today FINDINGS: Segmentation:  5 lumbar type vertebrae Alignment: Degenerative grade 1 anterolisthesis at L3-4 and L4-5. Mild levocurvature. Vertebrae:  No fracture, evidence of discitis, or bone lesion. Conus medullaris and cauda equina: Conus extends to the L2 level. Conus and cauda equina appear normal. Paraspinal and other soft tissues: Negative Disc levels: T12- L1: Unremarkable. L1-L2: Disc narrowing and right eccentric bulging/endplate ridging. Degenerative facet spurring. Noncompressive bilateral foraminal narrowing. L2-L3: Disc narrowing and bulging with degenerative facet spurring asymmetric to the right. No evidence of impingement L3-L4: Facet osteoarthritis with moderate spurring and mild anterolisthesis. The disc is bulging and there is moderate spinal stenosis. Right more than left foraminal narrowing with right foraminal protrusion up lifting the L3 nerve root. L4-L5: Disc narrowing and bulging with right paracentral protrusion. Degenerative facet spurring with mild anterolisthesis. Moderate to advanced spinal stenosis. Patent  foramina L5-S1:Disc narrowing and bulging with posterior annular ossification. Minor facet spurring. The canal and foramina are patent with mild to moderate left foraminal narrowing mainly due to disc height loss and bulging disc. IMPRESSION: 1. Significant motion artifact on axial acquisition. 2. History of fall.  No acute fracture. 3. Multilevel disc and facet degeneration with mild scoliosis and L3-4, L4-5 anterolisthesis. 4. Spinal stenosis that is moderate at L3-4 and moderate to advanced at L4-5. At L4-5 there is a superimposed right paracentral protrusion with asymmetric right-sided nerve root impingement. 5. Patent foramina with mild to moderate narrowings noted above. Electronically Signed   By: Monte Fantasia M.D.   On: 12/28/2019 07:55   DG Hip Unilat W or Wo Pelvis 2-3 Views Right  Result Date: 12/28/2019 CLINICAL DATA:  Fall, hip pain EXAM: DG HIP (WITH OR WITHOUT PELVIS) 2-3V RIGHT COMPARISON:  None. FINDINGS: There is no evidence of hip fracture or dislocation. 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 The sacroiliac joints appear to be intact. Degenerative changes in the lower lumbar spine. IMPRESSION: No acute fracture seen. Electronically Signed   By: Prudencio Pair M.D.   On: 12/28/2019 03:11    8:10 AM-MRI imaging indicates source of back pain, as DJD, with culprit lesion for right-sided leg symptoms, L4-5 nerve root impingement.  I observed patient walking about 45 feet from bathroom to her room.  At times she appeared to have foot drop but was able to walk without stumbling.  She appeared to be in pain, which was making it difficult for her to dorsiflex while walking.  She does not appear to have a persistent foot drop.  Patient indicates that she has ongoing pain and numbness of the right leg, with the pain being localized to the right  buttock region.  She has a follow-up appointment with her orthopedic pain  specialist, in 2 days.  She agrees to take Ultracet for pain, but does not want to take prednisone.  I have advised her to use ibuprofen for anti-inflammatory purposes.  She will also be advised to use heat.  She is going to take a cab home.  I gave her dose of Ultracet in the ED.  Degenerative joint lumbar   Daleen Bo, MD 12/29/19 [phone]

## 2019-12-28 NOTE — ED Notes (Signed)
Report given to [employer]

## 2019-12-28 NOTE — Discharge Instructions (Signed)
See your orthopedic pain specialist on Monday as scheduled.  Use heat on the sore area of your hip and lower back, to help the discomfort.  Use ibuprofen 400 mg 3 times a day with meals for pain and anti-inflammatory effect.  We are prescribing a pain reliever to use, but it can cause sedation so be careful.  Do not drive or take other sedating medicine when taking the Ultracet.  Return here, if needed, for problems.

## 2019-12-28 NOTE — ED Notes (Signed)
Pt walked to restroom and back

## 2019-12-28 NOTE — ED Notes (Signed)
Pt in bed resting. Pt states pain went from a 9 to a 5

## 2019-12-28 NOTE — ED Notes (Signed)
Pt in bed resting, no distress noted

## 2020-01-15 ENCOUNTER — Telehealth: Payer: Self-pay | Admitting: Family Medicine

## 2020-01-15 NOTE — Telephone Encounter (Signed)
Pt called stating she has an appt coming up and she was supposed to bring X-Rays that Dr.Wagner took however mychart won't allow her to send them and she states they weren't taken at an [facility]. I asked the pt if she would like our fax number so she can ask Dr.Wagner to fax the X-rays over and she stated she was not willing to ask him to do that and she also stated that she wouldn't want to retake the x-rays but she does not want to cancel her appt either. Pt would like a CB to discuss her options  [phone]

## 2020-01-15 NOTE — Telephone Encounter (Signed)
I called the patient. She has been treated at [facility] recently, and is seeing Dr. Junius Roads tomorrow for a 2nd opinion. I advised her to call that office and request those records and all xrays on cd to bring to her appointment tomorrow. She said she will do this right away.

## 2020-01-16 ENCOUNTER — Ambulatory Visit (INDEPENDENT_AMBULATORY_CARE_PROVIDER_SITE_OTHER): Payer: Medicare Other | Admitting: Family Medicine

## 2020-01-16 ENCOUNTER — Ambulatory Visit: Payer: Self-pay

## 2020-01-16 ENCOUNTER — Encounter: Payer: Self-pay | Admitting: Family Medicine

## 2020-01-16 ENCOUNTER — Other Ambulatory Visit: Payer: Self-pay

## 2020-01-16 DIAGNOSIS — M25551 Pain in right hip: Secondary | ICD-10-CM

## 2020-01-16 DIAGNOSIS — M25561 Pain in right knee: Secondary | ICD-10-CM | POA: Diagnosis not present

## 2020-01-16 DIAGNOSIS — M48061 Spinal stenosis, lumbar region without neurogenic claudication: Secondary | ICD-10-CM

## 2020-01-16 DIAGNOSIS — S93401A Sprain of unspecified ligament of right ankle, initial encounter: Secondary | ICD-10-CM

## 2020-01-16 DIAGNOSIS — M21071 Valgus deformity, not elsewhere classified, right ankle: Secondary | ICD-10-CM | POA: Diagnosis not present

## 2020-01-16 DIAGNOSIS — S7001XA Contusion of right hip, initial encounter: Secondary | ICD-10-CM

## 2020-01-16 DIAGNOSIS — M25571 Pain in right ankle and joints of right foot: Secondary | ICD-10-CM

## 2020-01-16 NOTE — Progress Notes (Signed)
Office Visit Note   Patient: Tonya Jenkins           Date of Birth: [birth year]           MRN: [id number] Visit Date: 01/16/2020 Requested by: No referring provider defined for this encounter. PCP: Patient, No Pcp Per  Subjective: Chief Complaint  Patient presents with  . Lower Back - Pain    Pain in the right posterior hip, right-sided lower back. S/p fall x 3, 3 weeks ago. Was being treated at [facility] for low back pain and had ESIs. Fell when her right leg went numb. Ambulates with walker. Has burning pain in right lower leg w/walking.  . Right Ankle - Pain    Twisted ankle with coming in the door 2 weeks ago. Was placed in a short cam boot for an ankle sprain. Has reinjured the ankle since - pain lateral ankle. Sharp pains in the posterior lower leg.     HPI: She is here with right hip and ankle pain.  She has been struggling with chronic low back pain due to spinal stenosis.  She went to [facility] for epidural injections recently.  Prior to that she had fallen and went to the ER on October 23.  She had x-rays of the hip, and lumbar spine, and MRI scan of the lumbar spine.  There is no acute fracture seen.  She underwent epidural injection later on but has not noticed much improvement.  She gets a burning pain from the posterior right hip down the leg.  2 weeks ago she twisted her ankle coming in the door after coming home from the ER.  She developed severe bruising in her ankle.  She went to [facility] again and had x-rays obtained which were read as negative for fracture.  She was placed in a fracture boot.    She came here for another opinion.  She would like x-rays done again.               ROS:   All other systems were reviewed and are negative.  Objective: Vital Signs: There were no vitals taken for this visit.  Physical Exam:  General:  Alert and oriented, in no acute distress. Pulm:  Breathing unlabored. Psy:  Normal mood, congruent affect. Skin: There is  significant bruising over the lateral ankle.  Almost resolved ecchymosis in the right buttocks area. Right hip: She has good range of motion and no significant pain with passive flexion and internal/external rotation.  In the posterior hip she is tender along the sacrum near the sciatic notch.  She has 5/5 hip flexion, knee extension, foot dorsiflexion, plantar flexion strength but she has weakness with ankle eversion on the right, 4/5 compared to normal strength on the left. Right ankle: She has good range of motion today with no effusion.  Ligaments feel stable.  She is tender over the ATFL.   Imaging: XR HIP UNILAT W OR W/O PELVIS 2-3 VIEWS RIGHT  Result Date: 01/16/2020 X-rays of the right hip reveal no degenerative change.  No sign of sacral fracture.  XR Ankle Complete Right  Result Date: 01/16/2020 X-rays of the right ankle reveal normal anatomic alignment, no sign of fracture or OCD.  XR Tibia/Fibula Right  Result Date: 01/16/2020 X-rays of the right tibia/fibula are negative for obvious fracture.  No sign of neoplasm, no soft tissue abnormality seen.   Assessment & Plan: 1.  Right hip contusion, cannot rule out sacral insufficiency fracture. -MRI  to further evaluate. -Referral to [place] [facility] for water therapy.  2.  Right ankle eversion weakness, possibly from lumbar nerve impingement but cannot rule out peroneal stretch injury.  This is apparently fairly chronic.  -We will proceed with nerve conduction studies.  3.  Right ankle sprain -Physical therapy.  MRI of the ankle to rule out fracture.  4.  Spinal stenosis -Patient would like to see Dr. Ernestina Patches for epidural injection.     Procedures: No procedures performed  No notes on file     PMFS History: Patient Active Problem List   Diagnosis Date Noted  . Closed fracture of metatarsal bone 12/10/2018  . Right foot pain 02/05/2018  . Hallux valgus of right foot 11/09/2017  . Osteoarthritis of knee  10/14/2015   Past Medical History:  Diagnosis Date  . History of hip surgery   . Hx of neck surgery   . Hypertension     History reviewed. No pertinent family history.  Past Surgical History:  Procedure Laterality Date  . CERVICAL SPINE SURGERY    . HAMMER TOE SURGERY    . HIP SURGERY    . KNEE SURGERY     Social History   Occupational History  . Not on file  Tobacco Use  . Smoking status: Never Smoker  . Smokeless tobacco: Never Used  Substance and Sexual Activity  . Alcohol use: Not Currently  . Drug use: Not Currently  . Sexual activity: Not on file

## 2020-01-17 ENCOUNTER — Other Ambulatory Visit: Payer: Self-pay

## 2020-01-17 DIAGNOSIS — M25561 Pain in right knee: Secondary | ICD-10-CM

## 2020-01-17 DIAGNOSIS — M25551 Pain in right hip: Secondary | ICD-10-CM

## 2020-01-17 DIAGNOSIS — M25571 Pain in right ankle and joints of right foot: Secondary | ICD-10-CM

## 2020-01-22 ENCOUNTER — Telehealth: Payer: Self-pay

## 2020-01-22 ENCOUNTER — Telehealth: Payer: Self-pay | Admitting: Physical Medicine and Rehabilitation

## 2020-01-22 NOTE — Telephone Encounter (Signed)
Patient called asked if the medication she is taking determines how her appointment was scheduled? Patient said she had finished a round prednisone. Patient said the pain in her back have decreased. The pain is her leg and foot still hurt pretty bad.  Patient want Dr Ernestina Patches to be her provider. The number to contact patient is [phone]

## 2020-01-22 NOTE — Telephone Encounter (Signed)
Patient called back returning missed call

## 2020-01-22 NOTE — Telephone Encounter (Signed)
Patient called back. I returned her call and left message #2.

## 2020-01-22 NOTE — Telephone Encounter (Signed)
Left message #1

## 2020-01-22 NOTE — Telephone Encounter (Signed)
See previous message

## 2020-01-23 NOTE — Telephone Encounter (Signed)
Called patient and rescheduled for 12/7.

## 2020-01-24 ENCOUNTER — Other Ambulatory Visit: Payer: Self-pay | Admitting: Family Medicine

## 2020-01-24 DIAGNOSIS — Z77018 Contact with and (suspected) exposure to other hazardous metals: Secondary | ICD-10-CM

## 2020-01-31 ENCOUNTER — Encounter: Payer: Self-pay | Admitting: Family Medicine

## 2020-01-31 DIAGNOSIS — M25571 Pain in right ankle and joints of right foot: Secondary | ICD-10-CM

## 2020-01-31 DIAGNOSIS — M48061 Spinal stenosis, lumbar region without neurogenic claudication: Secondary | ICD-10-CM

## 2020-02-03 ENCOUNTER — Telehealth: Payer: Self-pay | Admitting: Physical Medicine and Rehabilitation

## 2020-02-03 ENCOUNTER — Telehealth: Payer: Self-pay

## 2020-02-03 NOTE — Telephone Encounter (Signed)
Patient called in wanting to see about what she should do regarding her injection says she has another dr appt same day , she doesn't know whether to go to both appts or just one.

## 2020-02-03 NOTE — Addendum Note (Signed)
Addended by: Hortencia Pilar on: 02/03/2020 10:32 AM   Modules accepted: Orders

## 2020-02-03 NOTE — Telephone Encounter (Signed)
Patient called requesting an update on a call back from [place] or [place]. Please give this patient a call asap at [phone].

## 2020-02-04 ENCOUNTER — Encounter: Payer: Self-pay | Admitting: Physical Medicine and Rehabilitation

## 2020-02-04 NOTE — Telephone Encounter (Signed)
Called patient to advise  °

## 2020-02-04 NOTE — Telephone Encounter (Signed)
See previous message

## 2020-02-05 ENCOUNTER — Telehealth: Payer: Self-pay | Admitting: Neurology

## 2020-02-05 ENCOUNTER — Ambulatory Visit: Payer: Medicare Other | Admitting: Neurology

## 2020-02-05 NOTE — Telephone Encounter (Addendum)
Patient canceled appointment in neurology within 24 hours.

## 2020-02-11 ENCOUNTER — Ambulatory Visit: Payer: Self-pay

## 2020-02-11 ENCOUNTER — Ambulatory Visit (INDEPENDENT_AMBULATORY_CARE_PROVIDER_SITE_OTHER): Payer: Medicare Other | Admitting: Physical Medicine and Rehabilitation

## 2020-02-11 ENCOUNTER — Other Ambulatory Visit: Payer: Self-pay

## 2020-02-11 ENCOUNTER — Encounter: Payer: Self-pay | Admitting: Physical Medicine and Rehabilitation

## 2020-02-11 VITALS — BP 153/86 | HR 85

## 2020-02-11 DIAGNOSIS — M48062 Spinal stenosis, lumbar region with neurogenic claudication: Secondary | ICD-10-CM

## 2020-02-11 DIAGNOSIS — M5116 Intervertebral disc disorders with radiculopathy, lumbar region: Secondary | ICD-10-CM

## 2020-02-11 DIAGNOSIS — M5416 Radiculopathy, lumbar region: Secondary | ICD-10-CM | POA: Diagnosis not present

## 2020-02-11 MED ORDER — METHYLPREDNISOLONE ACETATE 80 MG/ML IJ SUSP
80.0000 mg | Freq: Once | INTRAMUSCULAR | Status: AC
  Administered 2020-02-11: 80 mg

## 2020-02-11 NOTE — Procedures (Signed)
Lumbosacral Transforaminal Epidural Steroid Injection - Sub-Pedicular Approach with Fluoroscopic Guidance  Patient: Tonya Jenkins      Date of Birth: [birth year] MRN: [id number] PCP: Patient, No Pcp Per      Visit Date: 02/11/2020   Universal Protocol:    Date/Time: 02/11/2020  Consent Given By: the patient  Position: PRONE  Additional Comments: Vital signs were monitored before and after the procedure. Patient was prepped and draped in the usual sterile fashion. The correct patient, procedure, and site was verified.   Injection Procedure Details:   Procedure diagnoses: Lumbar radiculopathy [M54.16]    Meds Administered:  Meds ordered this encounter  Medications  . methylPREDNISolone acetate (DEPO-MEDROL) injection 80 mg    Laterality: Right  Location/Site:  L4-L5 L5-S1  Needle:5.0 in., 22 ga.  Short bevel or Quincke spinal needle  Needle Placement: Transforaminal  Findings:    -Comments: Excellent flow of contrast along the nerve, nerve root and into the epidural space.  Procedure Details: After squaring off the end-plates to get a true AP view, the C-arm was positioned so that an oblique view of the foramen as noted above was visualized. The target area is just inferior to the "nose of the scotty dog" or sub pedicular. The soft tissues overlying this structure were infiltrated with 2-3 ml. of 1% Lidocaine without Epinephrine.  The spinal needle was inserted toward the target using a "trajectory" view along the fluoroscope beam.  Under AP and lateral visualization, the needle was advanced so it did not puncture dura and was located close the 6 O'Clock position of the pedical in AP tracterory. Biplanar projections were used to confirm position. Aspiration was confirmed to be negative for CSF and/or blood. A 1-2 ml. volume of Isovue-250 was injected and flow of contrast was noted at each level. Radiographs were obtained for documentation purposes.   After attaining the  desired flow of contrast documented above, a 0.5 to 1.0 ml test dose of 0.25% Marcaine was injected into each respective transforaminal space.  The patient was observed for 90 seconds post injection.  After no sensory deficits were reported, and normal lower extremity motor function was noted,   the above injectate was administered so that equal amounts of the injectate were placed at each foramen (level) into the transforaminal epidural space.   Additional Comments:  The patient tolerated the procedure well Dressing: 2 x 2 sterile gauze and Band-Aid    Post-procedure details: Patient was observed during the procedure. Post-procedure instructions were reviewed.  Patient left the clinic in stable condition.

## 2020-02-11 NOTE — Progress Notes (Signed)
Dexter Sauser - 74 y.o. female MRN [id number]  Date of birth: 1945/10/23  Office Visit Note: Visit Date: 02/11/2020 PCP: [name], MD Referred by: Eunice Blase, MD  Subjective: Chief Complaint  Patient presents with  . Lower Back - Pain  . Right Leg - Pain   HPI:  Joanie Duprey is a 74 y.o. female who comes in today at the request of Dr. Eunice Blase for planned Right L4 and L5 Lumbar epidural steroid injection with fluoroscopic guidance.  The patient has failed conservative care including home exercise, medications, time and activity modification.  This injection will be diagnostic and hopefully therapeutic.  Please see requesting physician notes for further details and justification.  MRI reviewed with images and spine model.  MRI reviewed in the note below.    ROS Otherwise per HPI.  Assessment & Plan: Visit Diagnoses:    ICD-10-CM   1. Lumbar radiculopathy  M54.16 XR C-ARM NO REPORT    Epidural Steroid injection    methylPREDNISolone acetate (DEPO-MEDROL) injection 80 mg  2. Spinal stenosis of lumbar region with neurogenic claudication  M48.062 XR C-ARM NO REPORT    Epidural Steroid injection    methylPREDNISolone acetate (DEPO-MEDROL) injection 80 mg  3. Radiculopathy due to lumbar intervertebral disc disorder  M51.16 XR C-ARM NO REPORT    Epidural Steroid injection    methylPREDNISolone acetate (DEPO-MEDROL) injection 80 mg    Plan: No additional findings.   Meds & Orders:  Meds ordered this encounter  Medications  . methylPREDNISolone acetate (DEPO-MEDROL) injection 80 mg    Orders Placed This Encounter  Procedures  . XR C-ARM NO REPORT  . Epidural Steroid injection    Follow-up: Return if symptoms worsen or fail to improve.   Procedures: No procedures performed  Lumbosacral Transforaminal Epidural Steroid Injection - Sub-Pedicular Approach with Fluoroscopic Guidance  Patient: Dakotah Heiman      Date of Birth: 06/27/1945 MRN: [id number] PCP:  Patient, No Pcp Per      Visit Date: 02/11/2020   Universal Protocol:    Date/Time: 02/11/2020  Consent Given By: the patient  Position: PRONE  Additional Comments: Vital signs were monitored before and after the procedure. Patient was prepped and draped in the usual sterile fashion. The correct patient, procedure, and site was verified.   Injection Procedure Details:   Procedure diagnoses: Lumbar radiculopathy [M54.16]    Meds Administered:  Meds ordered this encounter  Medications  . methylPREDNISolone acetate (DEPO-MEDROL) injection 80 mg    Laterality: Right  Location/Site:  L4-L5 L5-S1  Needle:5.0 in., 22 ga.  Short bevel or Quincke spinal needle  Needle Placement: Transforaminal  Findings:    -Comments: Excellent flow of contrast along the nerve, nerve root and into the epidural space.  Procedure Details: After squaring off the end-plates to get a true AP view, the C-arm was positioned so that an oblique view of the foramen as noted above was visualized. The target area is just inferior to the "nose of the scotty dog" or sub pedicular. The soft tissues overlying this structure were infiltrated with 2-3 ml. of 1% Lidocaine without Epinephrine.  The spinal needle was inserted toward the target using a "trajectory" view along the fluoroscope beam.  Under AP and lateral visualization, the needle was advanced so it did not puncture dura and was located close the 6 O'Clock position of the pedical in AP tracterory. Biplanar projections were used to confirm position. Aspiration was confirmed to be negative for CSF and/or blood. A 1-2  ml. volume of Isovue-250 was injected and flow of contrast was noted at each level. Radiographs were obtained for documentation purposes.   After attaining the desired flow of contrast documented above, a 0.5 to 1.0 ml test dose of 0.25% Marcaine was injected into each respective transforaminal space.  The patient was observed for 90 seconds  post injection.  After no sensory deficits were reported, and normal lower extremity motor function was noted,   the above injectate was administered so that equal amounts of the injectate were placed at each foramen (level) into the transforaminal epidural space.   Additional Comments:  The patient tolerated the procedure well Dressing: 2 x 2 sterile gauze and Band-Aid    Post-procedure details: Patient was observed during the procedure. Post-procedure instructions were reviewed.  Patient left the clinic in stable condition.      Clinical History: MRI LUMBAR SPINE WITHOUT CONTRAST  TECHNIQUE: Multiplanar, multisequence MR imaging of the lumbar spine was performed. No intravenous contrast was administered.  COMPARISON:  Lumbar radiography from earlier today  FINDINGS: Segmentation:  5 lumbar type vertebrae  Alignment: Degenerative grade 1 anterolisthesis at L3-4 and L4-5. Mild levocurvature.  Vertebrae:  No fracture, evidence of discitis, or bone lesion.  Conus medullaris and cauda equina: Conus extends to the L2 level. Conus and cauda equina appear normal.  Paraspinal and other soft tissues: Negative  Disc levels:  T12- L1: Unremarkable.  L1-L2: Disc narrowing and right eccentric bulging/endplate ridging. Degenerative facet spurring. Noncompressive bilateral foraminal narrowing.  L2-L3: Disc narrowing and bulging with degenerative facet spurring asymmetric to the right. No evidence of impingement  L3-L4: Facet osteoarthritis with moderate spurring and mild anterolisthesis. The disc is bulging and there is moderate spinal stenosis. Right more than left foraminal narrowing with right foraminal protrusion up lifting the L3 nerve root.  L4-L5: Disc narrowing and bulging with right paracentral protrusion. Degenerative facet spurring with mild anterolisthesis. Moderate to advanced spinal stenosis. Patent foramina  L5-S1:Disc narrowing and bulging  with posterior annular ossification. Minor facet spurring. The canal and foramina are patent with mild to moderate left foraminal narrowing mainly due to disc height loss and bulging disc.  IMPRESSION: 1. Significant motion artifact on axial acquisition. 2. History of fall.  No acute fracture. 3. Multilevel disc and facet degeneration with mild scoliosis and L3-4, L4-5 anterolisthesis. 4. Spinal stenosis that is moderate at L3-4 and moderate to advanced at L4-5. At L4-5 there is a superimposed right paracentral protrusion with asymmetric right-sided nerve root impingement. 5. Patent foramina with mild to moderate narrowings noted above.   Electronically Signed   By: Monte Fantasia M.D.   On: 12/28/2019 07:55     Objective:  VS:  HT:    WT:   BMI:     BP:(!) 153/86  HR:85bpm  TEMP: ( )  RESP:  Physical Exam Constitutional:      General: She is not in acute distress.    Appearance: Normal appearance. She is not ill-appearing.  HENT:     Head: Normocephalic and atraumatic.     Right Ear: External ear normal.     Left Ear: External ear normal.  Eyes:     Extraocular Movements: Extraocular movements intact.  Cardiovascular:     Rate and Rhythm: Normal rate.     Pulses: Normal pulses.  Musculoskeletal:     Right lower leg: No edema.     Left lower leg: No edema.     Comments: Patient has good distal strength with no  pain over the greater trochanters.  No clonus or focal weakness. Walking boot on right ankle.  Skin:    Findings: No erythema, lesion or rash.  Neurological:     General: No focal deficit present.     Mental Status: She is alert and oriented to person, place, and time.     Sensory: No sensory deficit.     Motor: No weakness or abnormal muscle tone.     Coordination: Coordination normal.  Psychiatric:        Mood and Affect: Mood normal.        Behavior: Behavior normal.      Imaging: No results found.

## 2020-02-11 NOTE — Progress Notes (Signed)
Pt state lower back pain that travels down to her right left. Pt state walking and turning or twisting makes the pain worse. Pt state heating pads help ease the pain.  Numeric Pain Rating Scale and Functional Assessment Average Pain 3   In the last MONTH (on 0-10 scale) has pain interfered with the following?  1. General activity like being  able to carry out your everyday physical activities such as walking, climbing stairs, carrying groceries, or moving a chair?  Rating(8)   +Driver, -BT, -Dye Allergies.

## 2020-02-17 ENCOUNTER — Ambulatory Visit: Payer: Federal, State, Local not specified - PPO | Admitting: Physical Medicine and Rehabilitation

## 2020-02-17 ENCOUNTER — Encounter: Payer: Self-pay | Admitting: Physical Medicine and Rehabilitation

## 2020-02-18 ENCOUNTER — Encounter: Payer: Self-pay | Admitting: Family Medicine

## 2020-02-19 ENCOUNTER — Ambulatory Visit
Admission: RE | Admit: 2020-02-19 | Discharge: 2020-02-19 | Disposition: A | Discharge status: Home / Self Care | Payer: Medicare Other | Source: Ambulatory Visit | Attending: Family Medicine | Admitting: Family Medicine

## 2020-02-19 ENCOUNTER — Other Ambulatory Visit: Payer: Self-pay

## 2020-02-19 ENCOUNTER — Ambulatory Visit (INDEPENDENT_AMBULATORY_CARE_PROVIDER_SITE_OTHER): Payer: Medicare Other

## 2020-02-19 DIAGNOSIS — M25551 Pain in right hip: Secondary | ICD-10-CM

## 2020-02-19 DIAGNOSIS — M25571 Pain in right ankle and joints of right foot: Secondary | ICD-10-CM

## 2020-02-19 IMAGING — MR MR SACRUM / SI JOINTS WO CM
6 of 8 series · 25 of 48 positions shown · non-contrast
Comparison: Pelvis and right hip x-rays dated [DATE]. MR
lumbar spine dated [DATE].

CLINICAL DATA: Back pain.

EXAM:
MRI SACRUM WITHOUT CONTRAST
TECHNIQUE: Multiplanar, multisequence MR imaging of the sacrum was performed.
No intravenous contrast was administered.

[Series 4: T2 fat-sat · sagittal · 4.0mm · 0.75mm/px · 5 of 35 slices shown (1 of 2)]
[im 1/35]
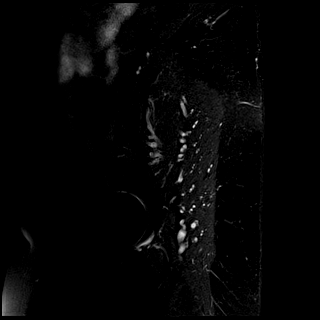
[im 9/35]
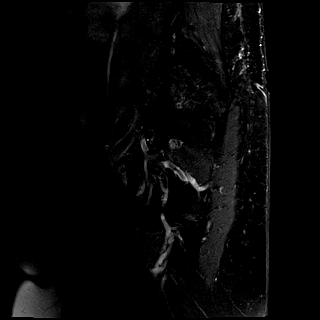
[im 18/35]
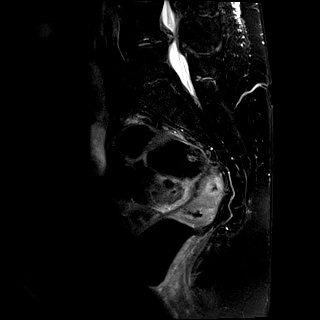
[im 26/35]
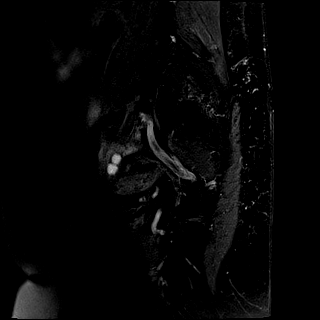
[im 35/35]
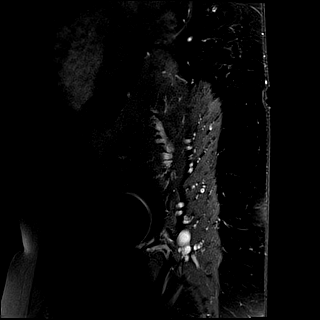

[Series 5: cor loc · coronal · 8.0mm · 0.78mm/px · 2 of 12 slices shown]
[im 1/12]
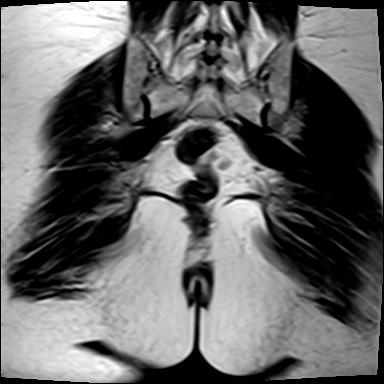
[im 12/12]
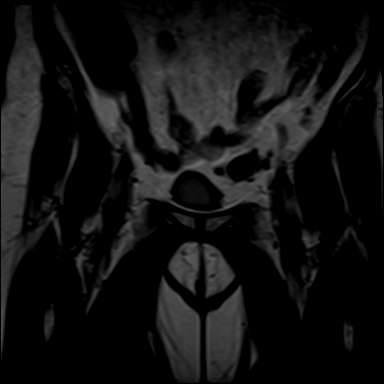

[Series 6: T1 · oblique · 3.0mm · 0.55mm/px · 4 of 24 slices shown (1 of 2)]
[im 1/24]
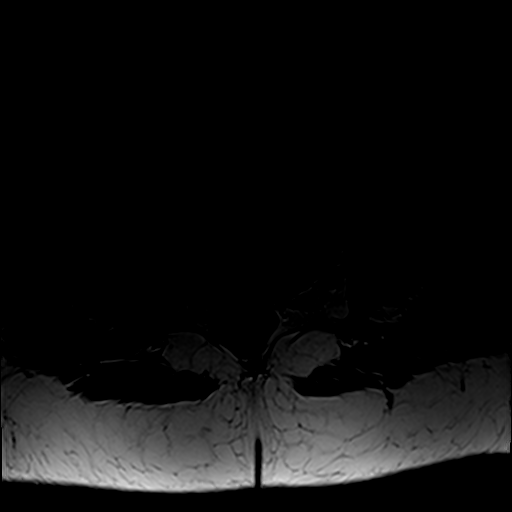
[im 8/24]
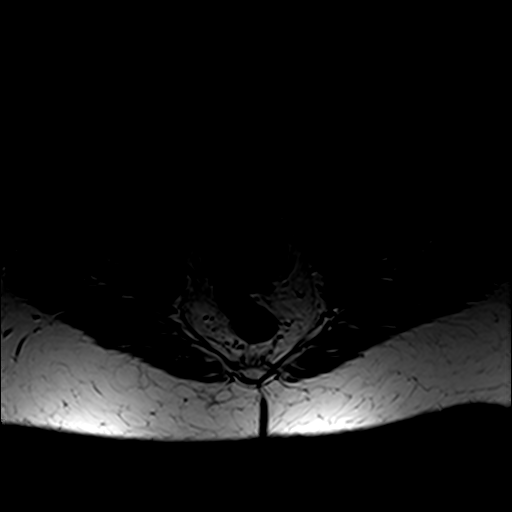
[im 16/24]
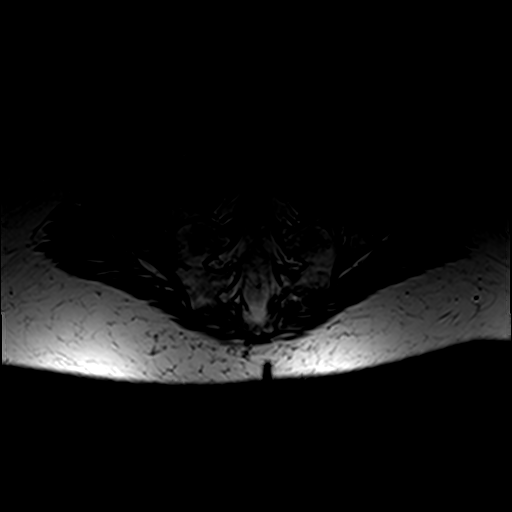
[im 24/24]
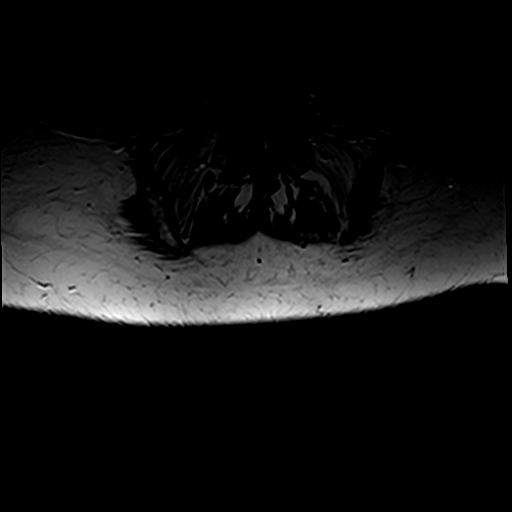

[Series 7: STIR · oblique · 3.0mm · 0.55mm/px · 5 of 26 slices shown]
[im 1/26]
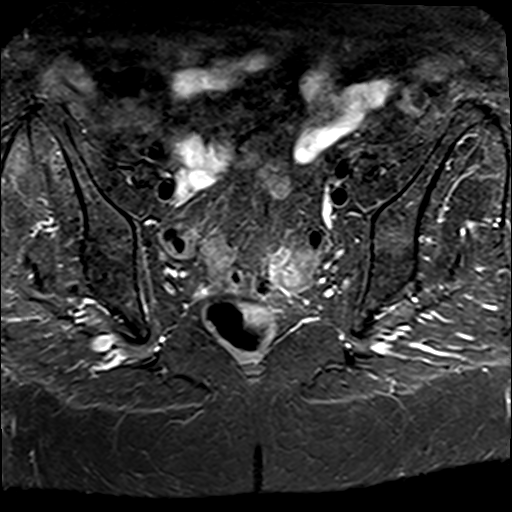
[im 7/26]
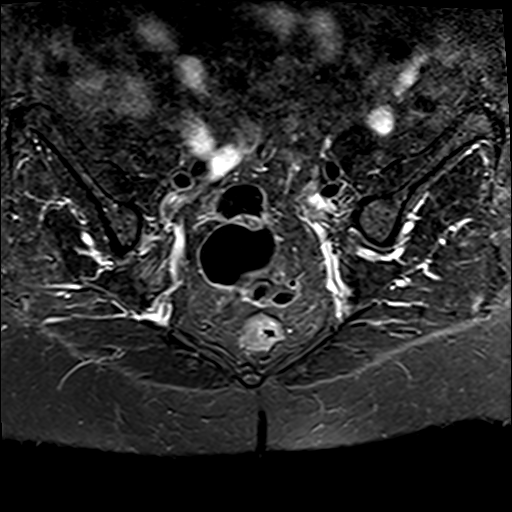
[im 13/26]
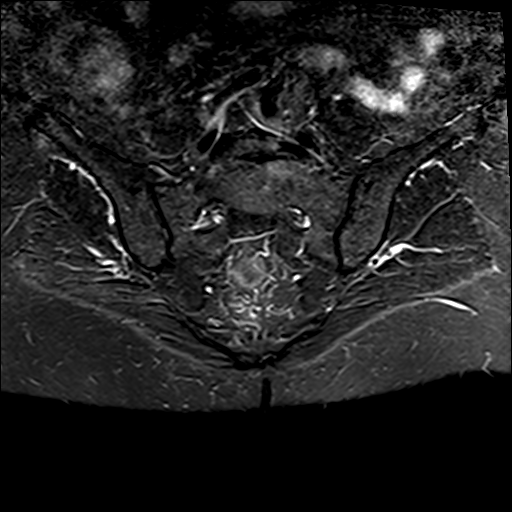
[im 19/26]
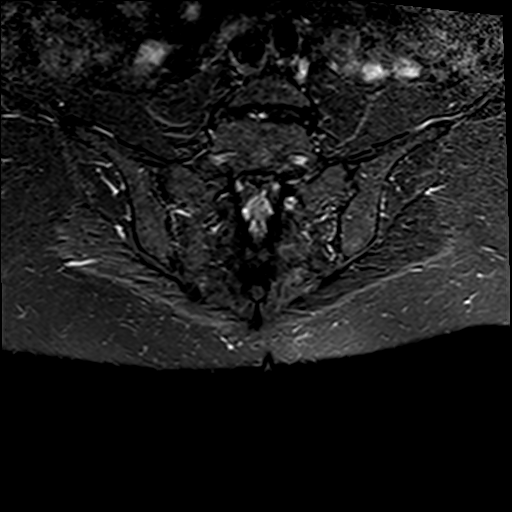
[im 26/26]
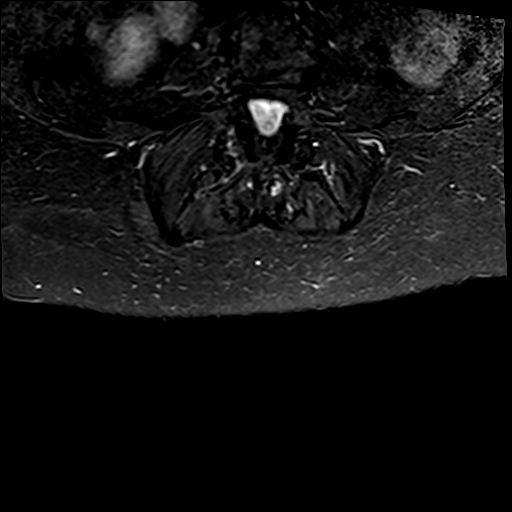

[Series 8: T2 fat-sat · axial · 4.0mm · 0.59mm/px · z∈[-98,+90]mm · 8 of 44 slices shown (2 of 2)]
[im 1/44]
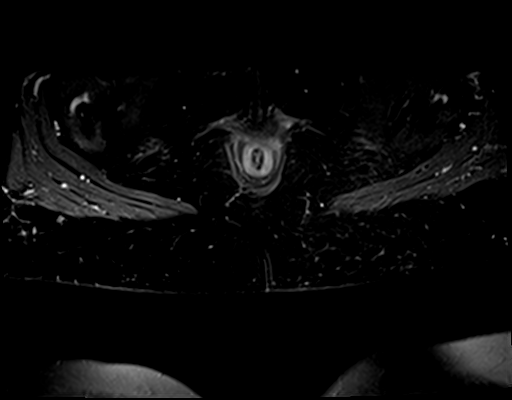
[im 7/44]
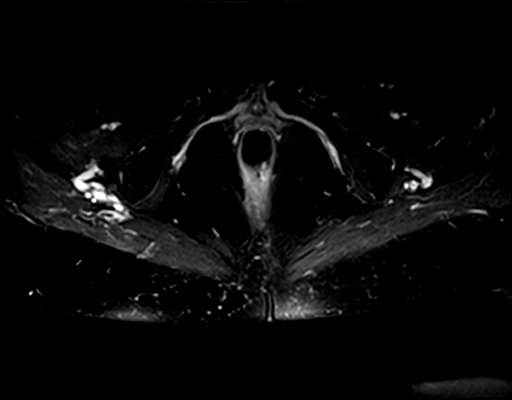
[im 13/44]
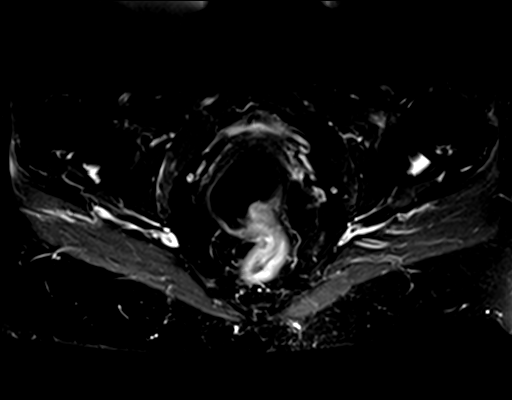
[im 19/44]
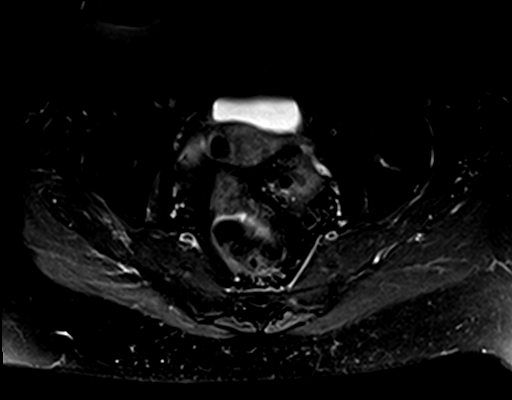
[im 25/44]
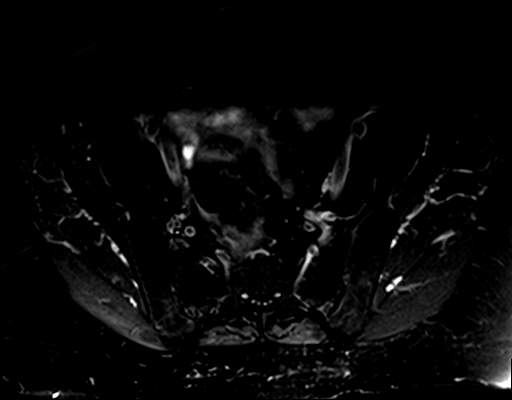
[im 31/44]
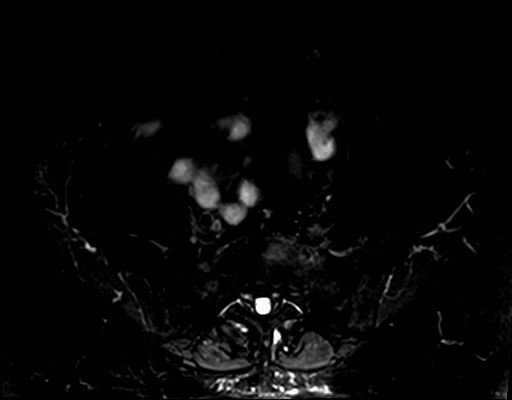
[im 37/44]
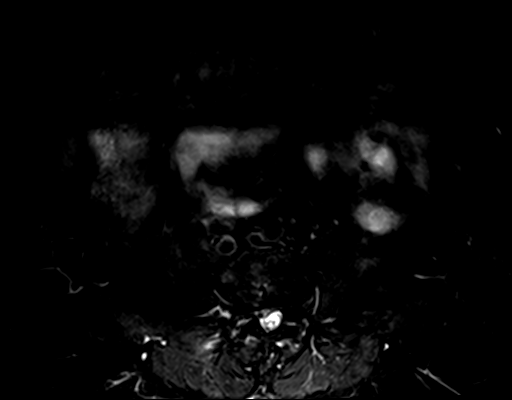
[im 44/44]
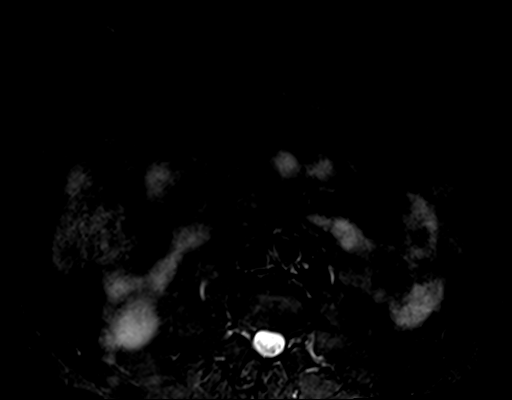

[Series 9: T1 · axial · 4.0mm · 0.59mm/px · 1 of 44 slices shown (2 of 2)]
[im 1/44]
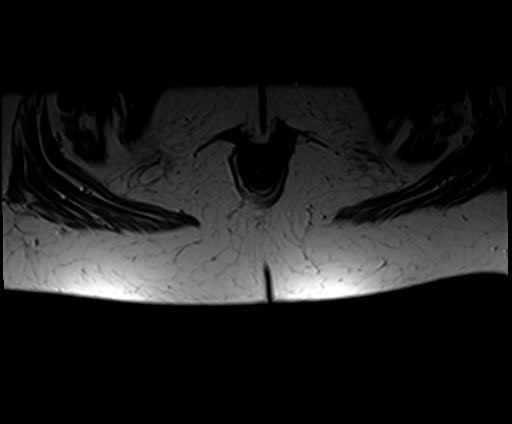

[25 of 48 positions shown; findings below may reference images not displayed]

FINDINGS: Bones/Joint/Cartilage

No marrow signal abnormality. No fracture or dislocation. No joint
effusion. Lower lumbar degenerative disc disease with unchanged
central and right paracentral disc protrusion at L4-L5.

Muscles and Tendons
Intact.  No muscle edema or atrophy.

Soft tissue
No fluid collection or hematoma. No soft tissue mass. Sigmoid
colonic diverticulosis.
IMPRESSION: 1. No acute abnormality.

## 2020-02-19 IMAGING — MR MR ANKLE*R* W/O CM
5 series · 38 of 40 positions shown · non-contrast
Comparison: X-ray [DATE]

CLINICAL DATA: Right ankle pain for 6 weeks

EXAM:
MRI OF THE RIGHT ANKLE WITHOUT CONTRAST
TECHNIQUE: Multiplanar, multisequence MR imaging of the ankle was performed. No
intravenous contrast was administered.

[Series 4: T2 fat-sat · axial · 3.0mm · 0.50mm/px · z∈[-77,+44]mm · 10 of 32 slices shown (1 of 2)]
[im 1/32]
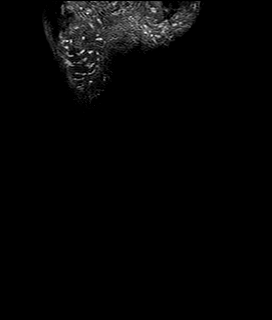
[im 4/32]
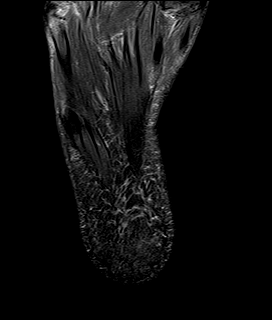
[im 7/32]
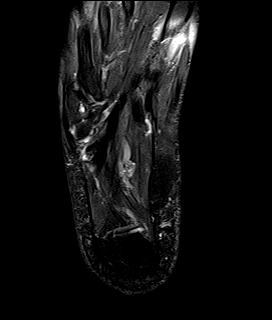
[im 11/32]
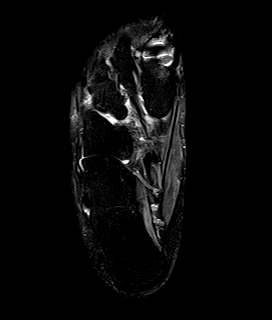
[im 14/32]
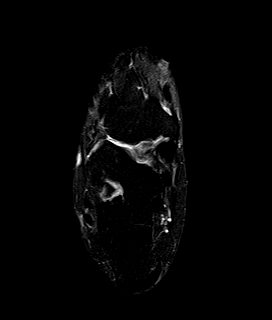
[im 18/32]
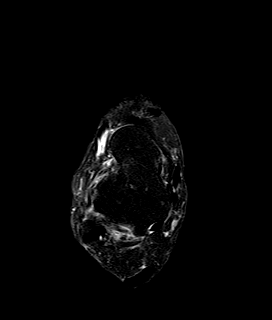
[im 21/32]
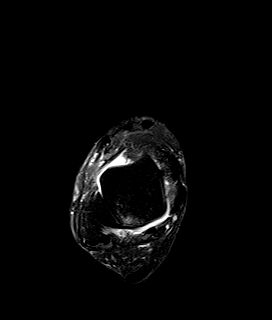
[im 25/32]
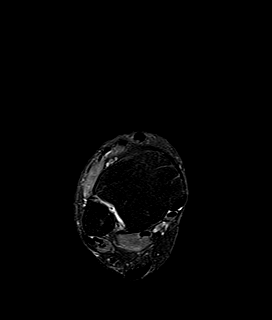
[im 28/32]
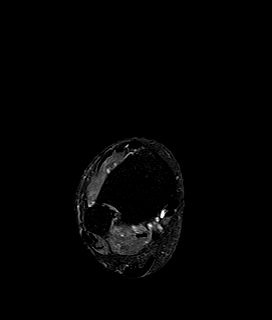
[im 32/32]
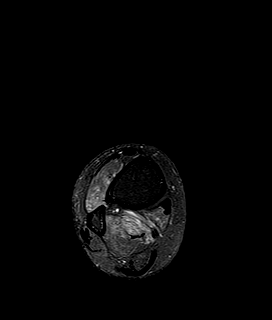

[Series 5: PD fat-sat · axial · 3.0mm · 0.42mm/px · z∈[-77,+44]mm · 10 of 32 slices shown]
[im 1/32]
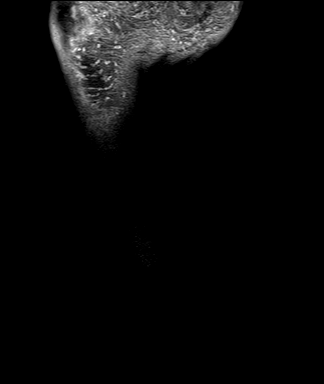
[im 4/32]
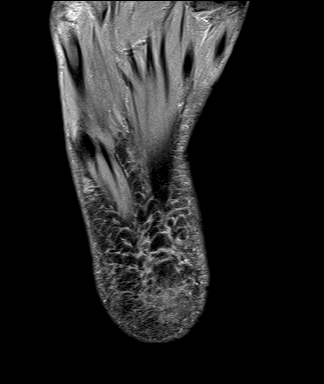
[im 7/32]
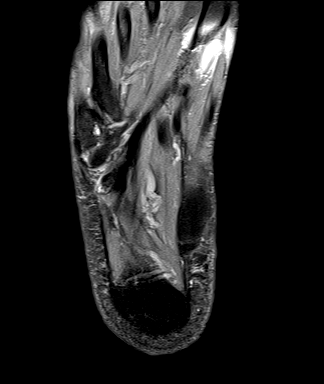
[im 11/32]
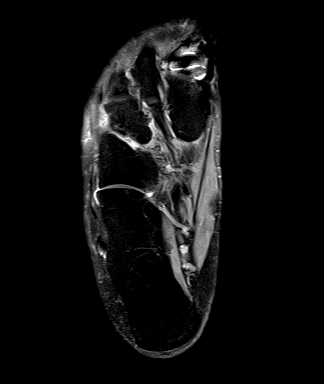
[im 14/32]
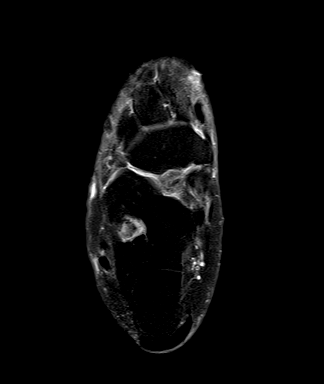
[im 18/32]
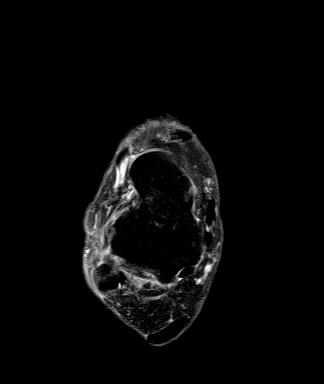
[im 21/32]
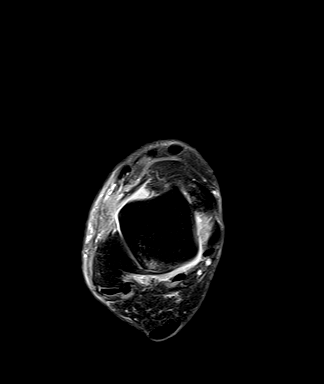
[im 25/32]
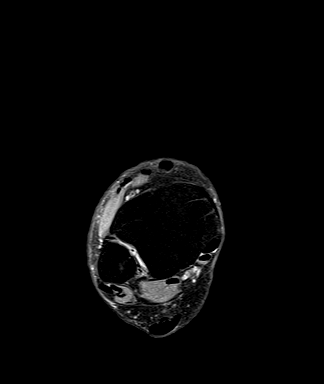
[im 28/32]
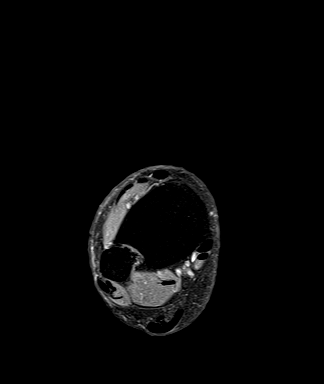
[im 32/32]
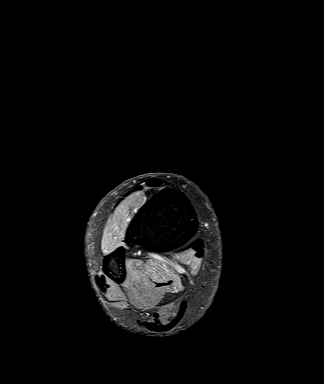

[Series 6: T1 · sagittal · 4.0mm · 0.56mm/px · 5 of 16 slices shown]
[im 1/16]
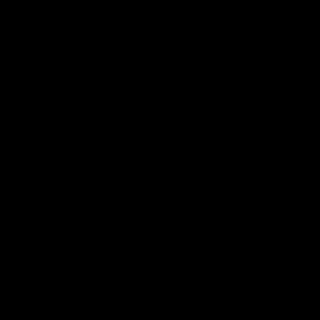
[im 4/16]
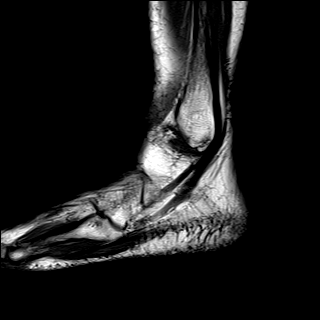
[im 8/16]
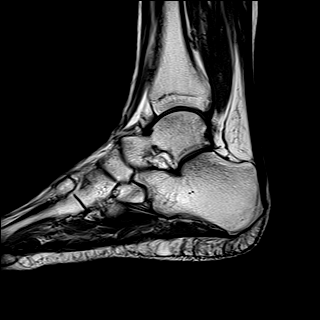
[im 12/16]
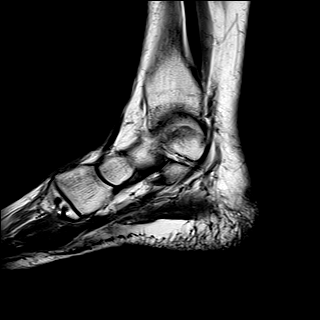
[im 16/16]
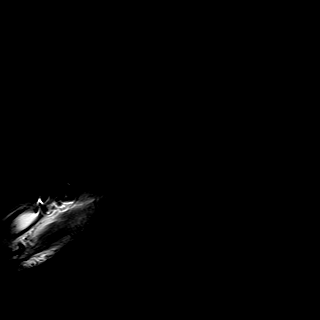

[Series 7: STIR · sagittal · 4.0mm · 0.35mm/px · 5 of 16 slices shown]
[im 1/16]
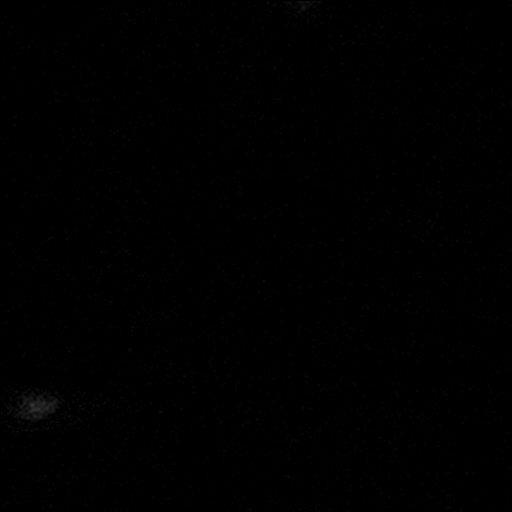
[im 4/16]
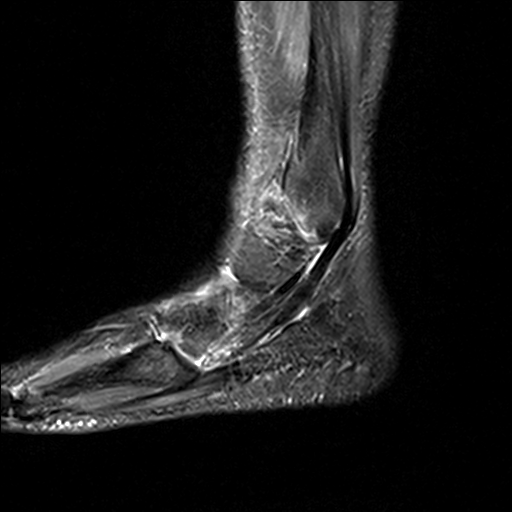
[im 8/16]
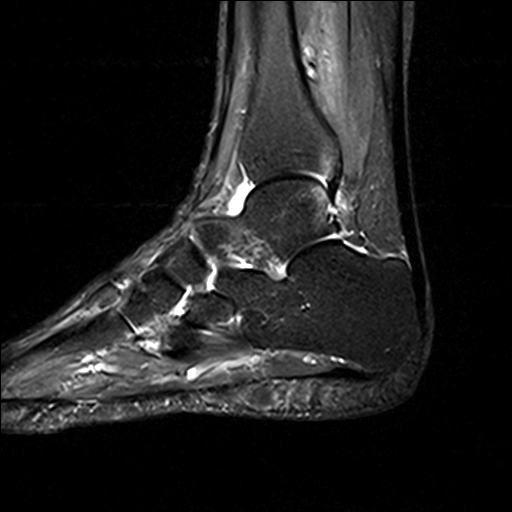
[im 12/16]
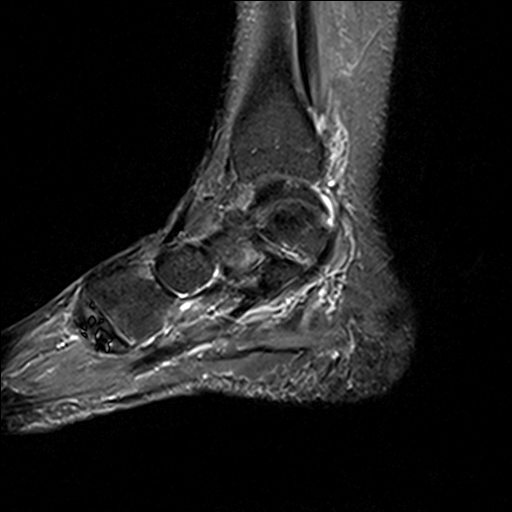
[im 16/16]
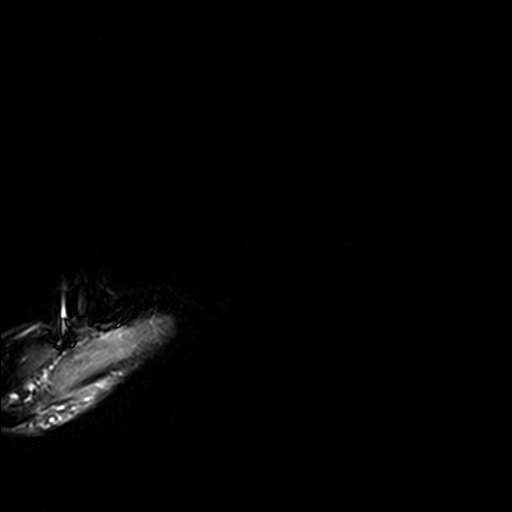

[Series 8: T2 fat-sat · coronal · 3.0mm · 0.47mm/px · 8 of 30 slices shown (2 of 2)]
[im 1/30]
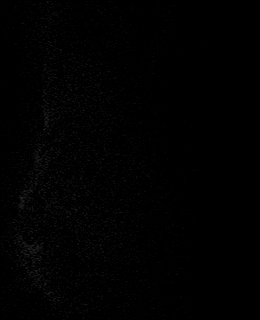
[im 4/30]
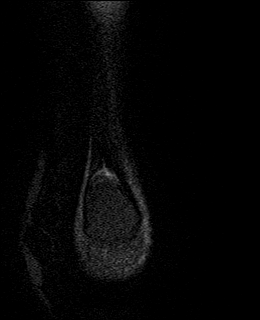
[im 10/30]
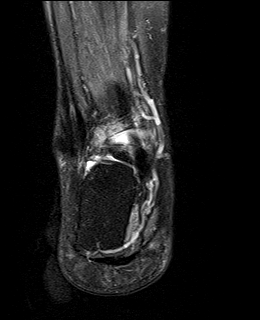
[im 13/30]
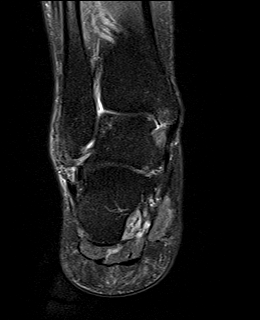
[im 17/30]
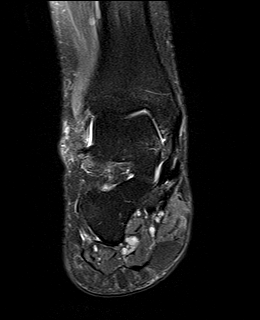
[im 20/30]
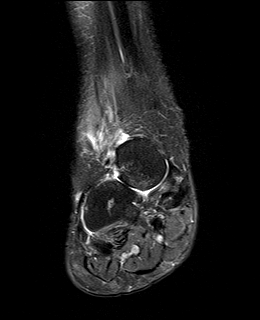
[im 26/30]
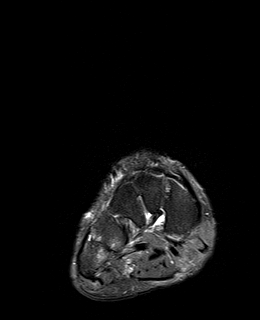
[im 30/30]
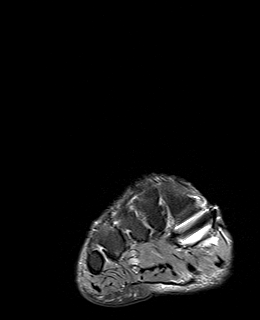

[38 of 40 positions shown; findings below may reference images not displayed]

FINDINGS: TENDONS

Peroneal: Short segment longitudinal split tear of the
infra-malleolar aspect of the peroneus brevis tendon with trace
associated tenosynovial fluid. Mild peroneus longus tendinosis
without discrete tear.

Posteromedial: Intact tibialis posterior, flexor hallucis longus and
flexor digitorum longus tendons.

Anterior: Intact tibialis anterior, extensor hallucis longus and
extensor digitorum longus tendons. Small volume tenosynovial fluid
associated with the extensor digitorum longus tendon.

Achilles: Intact.

Plantar Fascia: Intact.

LIGAMENTS

Lateral: Ill-defined heterogeneous soft tissue in the expected
location of the anterior talofibular ligament likely reflecting
sequela of prior injury. Grossly intact posterior talofibular
ligament. The anterior and posterior tibiofibular ligaments are
intact. Grossly intact calcaneofibular ligament.

Medial: Deltoid ligament and spring ligament complex intact.

CARTILAGE

Ankle Joint: 3 mm subchondral cyst at the medial talar shoulder with
overlying cartilage irregularity. Mild focal marrow edema at the
posterolateral aspect of the talar dome. Trace tibiotalar joint
effusion.

Subtalar Joints/Sinus Tarsi: No joint effusion or chondral defect.
Preservation of the anatomic fat within the sinus tarsi.

Bones: Partially visualized hardware within the first metatarsal.
Moderate arthropathy of the fourth and fifth TMT joints. Mild
arthropathy within the midfoot including at the calcaneocuboid
articulation. No acute fracture. No dislocation. No suspicious bone
lesion.

Other: Mild subcutaneous edema at the lateral ankle. No fluid
collections.
IMPRESSION: 1. No acute osseous abnormality of the right ankle.
2. Short segment longitudinal split tear of the infra-malleolar
aspect of the peroneus brevis tendon.
3. Mild peroneus longus tendinosis without discrete tear.
4. Findings suggest sequela of prior ATFL injury.
5. Mild tibiotalar arthropathy with tiny osteochondral lesion at the
medial talar shoulder.
6. Moderate arthropathy of the fourth and fifth TMT joints.

## 2020-02-19 NOTE — Progress Notes (Signed)
Patient came in to be fitted for an ASO brace for the right ankle - size small - instructions given on how to apply the brace (visual and paper copy). She wore it out under her cam boot, as she had no shoe with her. She will let us know if she has any issues/problems with the brace.

## 2020-02-20 ENCOUNTER — Telehealth: Payer: Self-pay | Admitting: Family Medicine

## 2020-02-20 NOTE — Telephone Encounter (Signed)
Ankle MRI shows tendinopathy and a possible split tear of the peroneal tendons.  Also some arthritis in the joint, but no fracture seen.  Sacrum MRI looks normal.  If ankle doesn't improve with therapy, could consider an injection.

## 2020-03-20 ENCOUNTER — Encounter: Payer: Self-pay | Admitting: Physical Medicine and Rehabilitation

## 2020-04-07 ENCOUNTER — Encounter: Payer: Self-pay | Admitting: Family Medicine

## 2020-04-07 ENCOUNTER — Other Ambulatory Visit: Payer: Self-pay

## 2020-04-07 ENCOUNTER — Ambulatory Visit (INDEPENDENT_AMBULATORY_CARE_PROVIDER_SITE_OTHER): Payer: Medicare Other | Admitting: Family Medicine

## 2020-04-07 DIAGNOSIS — M21611 Bunion of right foot: Secondary | ICD-10-CM

## 2020-04-07 DIAGNOSIS — M48061 Spinal stenosis, lumbar region without neurogenic claudication: Secondary | ICD-10-CM | POA: Diagnosis not present

## 2020-04-07 DIAGNOSIS — M25571 Pain in right ankle and joints of right foot: Secondary | ICD-10-CM

## 2020-04-07 NOTE — Progress Notes (Signed)
   Office Visit Note   Patient: Tonya Jenkins           Date of Birth: 13-Jun-1945           MRN: [id number] Visit Date: 04/07/2020 Requested by: [name], MD [place],  [place] [address] PCP: [name], MD  Subjective: Chief Complaint  Patient presents with  . Other    Recheck leg strength. Still walking with rolling walker. Tried using just a cane 3 days ago - right ankle turned. Anxious to be able to drive again.     HPI: Here for follow-up lumbar stenosis with right ankle weakness and peroneal split tear.  Lumbar injections helped.  The numbness in her great toe and second and third toes has improved.  She continues to have numbness in the fourth and fifth toes and on the side of her foot, with weakness in her ankle when trying to ambulate.  She also has a symptomatic bunion of the great toe that she would like to have addressed.  She has had surgery in the past for it but the surgery did not work.  She feels that it makes her more unstable.  She is still not able to drive.  Currently it is because she is unable to get from her house to her car safely on her own.  Partly it is because she has difficulty feeling the gas pedal and brake pedal with her foot, and her ankle still feels weak.                ROS:   All other systems were reviewed and are negative.  Objective: Vital Signs: There were no vitals taken for this visit.  Physical Exam:  General:  Alert and oriented, in no acute distress. Pulm:  Breathing unlabored. Psy:  Normal mood, congruent affect.  Right leg: Negative straight leg raise.  Good strength with knee extension, ankle dorsiflexion and ankle plantarflexion.  She still has weakness with ankle eversion, 4/5.   Imaging: No results found.  Assessment & Plan: 1.  Low back pain with stenosis, right ankle eversion weakness.  Weakness could be from stenosis versus peroneal nerve stretch injury. -We will try home health physical therapy for  strengthening and modalities.  If her strength does not improve, then nerve conduction studies.  Depending on nerve study results, could consider another epidural injection.     Procedures: No procedures performed        PMFS History: Patient Active Problem List   Diagnosis Date Noted  . Closed fracture of metatarsal bone 12/10/2018  . Right foot pain 02/05/2018  . Hallux valgus of right foot 11/09/2017  . Osteoarthritis of knee 10/14/2015   Past Medical History:  Diagnosis Date  . History of hip surgery   . Hx of neck surgery   . Hypertension     History reviewed. No pertinent family history.  Past Surgical History:  Procedure Laterality Date  . CERVICAL SPINE SURGERY    . HAMMER TOE SURGERY    . HIP SURGERY    . KNEE SURGERY     Social History   Occupational History  . Not on file  Tobacco Use  . Smoking status: Never Smoker  . Smokeless tobacco: Never Used  Substance and Sexual Activity  . Alcohol use: Not Currently  . Drug use: Not Currently  . Sexual activity: Not on file

## 2020-04-07 NOTE — Addendum Note (Signed)
Addended by: Hortencia Pilar on: 04/07/2020 02:25 PM   Modules accepted: Orders

## 2020-04-10 ENCOUNTER — Telehealth: Payer: Self-pay

## 2020-04-10 NOTE — Telephone Encounter (Signed)
Patient called she stated she would like a call back regarding info that was sent to [facility] [phone]

## 2020-04-13 NOTE — Telephone Encounter (Signed)
Yes and does take a few days for neurology to review and get back with pt. [place]

## 2020-04-13 NOTE — Telephone Encounter (Signed)
It looks like you were the one that faxed information to them last week.

## 2020-04-16 ENCOUNTER — Ambulatory Visit (INDEPENDENT_AMBULATORY_CARE_PROVIDER_SITE_OTHER): Payer: Medicare Other

## 2020-04-16 ENCOUNTER — Ambulatory Visit (INDEPENDENT_AMBULATORY_CARE_PROVIDER_SITE_OTHER): Payer: Medicare Other | Admitting: Orthopedic Surgery

## 2020-04-16 DIAGNOSIS — M79671 Pain in right foot: Secondary | ICD-10-CM | POA: Diagnosis not present

## 2020-04-16 DIAGNOSIS — M2021 Hallux rigidus, right foot: Secondary | ICD-10-CM

## 2020-04-20 ENCOUNTER — Encounter: Payer: Self-pay | Admitting: Orthopedic Surgery

## 2020-04-20 NOTE — Progress Notes (Signed)
Office Visit Note   Patient: Tonya Jenkins           Date of Birth: [birth year]           MRN: [id number] Visit Date: 04/16/2020              Requested by: [name], [place],  [place] [address] PCP: [name], MD  Chief Complaint  Patient presents with  . Right Foot - Pain      HPI: Patient is a 75 year old woman presents complaining of bunion pain right great toe.  She is status post bunion surgery with an opening proximal wedge osteotomy distal ostectomy she has valgus deformity of the great toe with the great toe under the second toe.  Assessment & Plan: Visit Diagnoses:  1. Pain in right foot   2. Hallux rigidus, right foot     Plan: Discussed that with the hallux rigidus and arthritis of the MTP joint her best option would be to proceed with a fusion of the MTP joint this would allow for straightening and resolve the postoperative degenerative pain at the MTP joint.  Risk and benefits were discussed including infection need for additional surgery.  Discussed the patient would be nonweightbearing for about 3 weeks and advance to full weightbearing in about 6 weeks.  Patient states she understands wished to proceed with surgery at outpatient surgery.  Follow-Up Instructions: No follow-ups on file.   Ortho Exam  Patient is alert, oriented, no adenopathy, well-dressed, normal affect, normal respiratory effort. Examination patient has a good dorsalis pedis and posterior tibial pulse she has subjective numbness on the lateral aspect of her right foot she states that she is undergoing epidural steroid injections for disc pathology.  Her great toe was under the second toe with a plantarflexed great toe.  She has degenerative arthritis of the great toe MTP joint with hallux rigidus with dorsiflexion only about 10 degrees.  Patient has pain with attempted range of motion of the great toe.  She has dorsiflexion ankle past neutral.  Imaging: No results  found. No images are attached to the encounter.  Labs: No results found for: HGBA1C, ESRSEDRATE, CRP, LABURIC, REPTSTATUS, GRAMSTAIN, CULT, LABORGA   No results found for: ALBUMIN, PREALBUMIN, LABURIC  No results found for: MG No results found for: VD25OH  No results found for: PREALBUMIN No flowsheet data found.   There is no height or weight on file to calculate BMI.  Orders:  Orders Placed This Encounter  Procedures  . XR Foot 2 Views Right   No orders of the defined types were placed in this encounter.    Procedures: No procedures performed  Clinical Data: No additional findings.  ROS:  All other systems negative, except as noted in the HPI. Review of Systems  Objective: Vital Signs: There were no vitals taken for this visit.  Specialty Comments:  No specialty comments available.  PMFS History: Patient Active Problem List   Diagnosis Date Noted  . Closed fracture of metatarsal bone 12/10/2018  . Right foot pain 02/05/2018  . Hallux valgus of right foot 11/09/2017  . Osteoarthritis of knee 10/14/2015   Past Medical History:  Diagnosis Date  . History of hip surgery   . Hx of neck surgery   . Hypertension     History reviewed. No pertinent family history.  Past Surgical History:  Procedure Laterality Date  . CERVICAL SPINE SURGERY    . HAMMER TOE SURGERY    .  HIP SURGERY    . KNEE SURGERY     Social History   Occupational History  . Not on file  Tobacco Use  . Smoking status: Never Smoker  . Smokeless tobacco: Never Used  Substance and Sexual Activity  . Alcohol use: Not Currently  . Drug use: Not Currently  . Sexual activity: Not on file

## 2020-04-27 ENCOUNTER — Encounter: Payer: Self-pay | Admitting: Physical Medicine and Rehabilitation

## 2020-04-27 ENCOUNTER — Ambulatory Visit: Payer: Self-pay

## 2020-04-27 ENCOUNTER — Ambulatory Visit (INDEPENDENT_AMBULATORY_CARE_PROVIDER_SITE_OTHER): Payer: Medicare Other | Admitting: Physical Medicine and Rehabilitation

## 2020-04-27 ENCOUNTER — Other Ambulatory Visit: Payer: Self-pay

## 2020-04-27 VITALS — BP 148/78 | HR 69

## 2020-04-27 DIAGNOSIS — M5116 Intervertebral disc disorders with radiculopathy, lumbar region: Secondary | ICD-10-CM

## 2020-04-27 DIAGNOSIS — M48062 Spinal stenosis, lumbar region with neurogenic claudication: Secondary | ICD-10-CM | POA: Diagnosis not present

## 2020-04-27 DIAGNOSIS — M5416 Radiculopathy, lumbar region: Secondary | ICD-10-CM

## 2020-04-27 MED ORDER — METHYLPREDNISOLONE ACETATE 80 MG/ML IJ SUSP
80.0000 mg | Freq: Once | INTRAMUSCULAR | Status: AC
  Administered 2020-04-27: 80 mg

## 2020-04-27 NOTE — Progress Notes (Signed)
Tonya Jenkins - 75 y.o. female MRN [id number]  Date of birth: [birth year]  Office Visit Note: Visit Date: 04/27/2020 PCP: [name], MD Referred by: [name], MD  Subjective: Chief Complaint  Patient presents with  . Lower Back - Pain  . Right Hip - Pain  . Right Leg - Pain  . Right Foot - Pain   HPI:  Tonya Jenkins is a 75 y.o. female who comes in today for planned repeat Right L4-L5 and L5-S1 Lumbar epidural steroid injection with fluoroscopic guidance.  The patient has failed conservative care including home exercise, medications, time and activity modification.  This injection will be diagnostic and hopefully therapeutic.  Please see requesting physician notes for further details and justification. Patient received more than 50% pain relief from prior injection.   Referring: Dr. Eunice Blase  Prior injection did offer more than 50% relief as noted above and did give the patient more functional ability for activities of daily living and was also beneficial in that it did reduce their medication requirement.  They have had physical therapy and continue with home exercises.  Current medication management is not beneficial in increasing their functional status.  Procedures are done as part of a comprehensive orthopedic and pain management program with access to in-house orthopedics, spine surgery and physical therapy as well as access to [facility] if needed.   She reports being in a severe amount of pain.  She reports that she is hopeful that the injections "wake up "the nerves and she has felt like she has gotten some feeling back in the leg.  I did try to explain to her the pharmacology of the epidural steroid injections and in terms of what we typically see with pain relief and may be paresthesia relief if it is an irritated nerve.  I did explain that if there is any amount of nerve injury then time course will allow some healing to that.   She may however want to consider surgical consultation.  ROS Otherwise per HPI.  Assessment & Plan: Visit Diagnoses:    ICD-10-CM   1. Lumbar radiculopathy  M54.16 XR C-ARM NO REPORT    Epidural Steroid injection    methylPREDNISolone acetate (DEPO-MEDROL) injection 80 mg  2. Spinal stenosis of lumbar region with neurogenic claudication  M48.062 XR C-ARM NO REPORT    Epidural Steroid injection    methylPREDNISolone acetate (DEPO-MEDROL) injection 80 mg  3. Radiculopathy due to lumbar intervertebral disc disorder  M51.16 XR C-ARM NO REPORT    Epidural Steroid injection    methylPREDNISolone acetate (DEPO-MEDROL) injection 80 mg    Plan: No additional findings.   Meds & Orders:  Meds ordered this encounter  Medications  . methylPREDNISolone acetate (DEPO-MEDROL) injection 80 mg    Orders Placed This Encounter  Procedures  . XR C-ARM NO REPORT  . Epidural Steroid injection    Follow-up: Return for visit to requesting physician as needed.   Procedures: No procedures performed  Lumbosacral Transforaminal Epidural Steroid Injection - Sub-Pedicular Approach with Fluoroscopic Guidance  Patient: Kayin Kettering      Date of Birth: 01-22-1946 MRN: [id number] PCP: [name], MD      Visit Date: 04/27/2020   Universal Protocol:    Date/Time: 04/27/2020  Consent Given By: the patient  Position: PRONE  Additional Comments: Vital signs were monitored before and after the procedure. Patient was prepped and draped in the usual sterile fashion. The correct patient, procedure, and site was  verified.   Injection Procedure Details:   Procedure diagnoses: Lumbar radiculopathy [M54.16]    Meds Administered:  Meds ordered this encounter  Medications  . methylPREDNISolone acetate (DEPO-MEDROL) injection 80 mg    Laterality: Right  Location/Site:  L4-L5 L5-S1  Needle:5.0 in., 22 ga.  Short bevel or Quincke spinal needle  Needle Placement: Transforaminal  Findings:     -Comments: Excellent flow of contrast along the nerve, nerve root and into the epidural space.  The L4 injection was fairly normal in response she did get some referred pressure.  The L5 injection however with any instillation of medication caused a pretty exaggerated response of initial screaming pain.  We were able to deliver some injectate into that area and it looks fairly well.  After the procedure she did quite well.  Procedure Details: After squaring off the end-plates to get a true AP view, the C-arm was positioned so that an oblique view of the foramen as noted above was visualized. The target area is just inferior to the "nose of the scotty dog" or sub pedicular. The soft tissues overlying this structure were infiltrated with 2-3 ml. of 1% Lidocaine without Epinephrine.  The spinal needle was inserted toward the target using a "trajectory" view along the fluoroscope beam.  Under AP and lateral visualization, the needle was advanced so it did not puncture dura and was located close the 6 O'Clock position of the pedical in AP tracterory. Biplanar projections were used to confirm position. Aspiration was confirmed to be negative for CSF and/or blood. A 1-2 ml. volume of Isovue-250 was injected and flow of contrast was noted at each level. Radiographs were obtained for documentation purposes.   After attaining the desired flow of contrast documented above, a 0.5 to 1.0 ml test dose of 0.25% Marcaine was injected into each respective transforaminal space.  The patient was observed for 90 seconds post injection.  After no sensory deficits were reported, and normal lower extremity motor function was noted,   the above injectate was administered so that equal amounts of the injectate were placed at each foramen (level) into the transforaminal epidural space.   Additional Comments:  The patient tolerated the procedure well Dressing: 2 x 2 sterile gauze and Band-Aid    Post-procedure  details: Patient was observed during the procedure. Post-procedure instructions were reviewed.  Patient left the clinic in stable condition.      Clinical History: MRI LUMBAR SPINE WITHOUT CONTRAST  TECHNIQUE: Multiplanar, multisequence MR imaging of the lumbar spine was performed. No intravenous contrast was administered.  COMPARISON:  Lumbar radiography from earlier today  FINDINGS: Segmentation:  5 lumbar type vertebrae  Alignment: Degenerative grade 1 anterolisthesis at L3-4 and L4-5. Mild levocurvature.  Vertebrae:  No fracture, evidence of discitis, or bone lesion.  Conus medullaris and cauda equina: Conus extends to the L2 level. Conus and cauda equina appear normal.  Paraspinal and other soft tissues: Negative  Disc levels:  T12- L1: Unremarkable.  L1-L2: Disc narrowing and right eccentric bulging/endplate ridging. Degenerative facet spurring. Noncompressive bilateral foraminal narrowing.  L2-L3: Disc narrowing and bulging with degenerative facet spurring asymmetric to the right. No evidence of impingement  L3-L4: Facet osteoarthritis with moderate spurring and mild anterolisthesis. The disc is bulging and there is moderate spinal stenosis. Right more than left foraminal narrowing with right foraminal protrusion up lifting the L3 nerve root.  L4-L5: Disc narrowing and bulging with right paracentral protrusion. Degenerative facet spurring with mild anterolisthesis. Moderate to advanced spinal stenosis.  Patent foramina  L5-S1:Disc narrowing and bulging with posterior annular ossification. Minor facet spurring. The canal and foramina are patent with mild to moderate left foraminal narrowing mainly due to disc height loss and bulging disc.  IMPRESSION: 1. Significant motion artifact on axial acquisition. 2. History of fall.  No acute fracture. 3. Multilevel disc and facet degeneration with mild scoliosis and L3-4, L4-5  anterolisthesis. 4. Spinal stenosis that is moderate at L3-4 and moderate to advanced at L4-5. At L4-5 there is a superimposed right paracentral protrusion with asymmetric right-sided nerve root impingement. 5. Patent foramina with mild to moderate narrowings noted above.   Electronically Signed   By: Monte Fantasia M.D.   On: 12/28/2019 07:55     Objective:  VS:  HT:    WT:   BMI:     BP:(!) 148/78  HR:69bpm  TEMP: ( )  RESP:  Physical Exam Vitals and nursing note reviewed.  Constitutional:      General: She is not in acute distress.    Appearance: Normal appearance. She is not ill-appearing.  HENT:     Head: Normocephalic and atraumatic.     Right Ear: External ear normal.     Left Ear: External ear normal.  Eyes:     Extraocular Movements: Extraocular movements intact.  Cardiovascular:     Rate and Rhythm: Normal rate.     Pulses: Normal pulses.  Pulmonary:     Effort: Pulmonary effort is normal. No respiratory distress.  Abdominal:     General: There is no distension.     Palpations: Abdomen is soft.  Musculoskeletal:        General: Tenderness present.     Cervical back: Neck supple.     Right lower leg: No edema.     Left lower leg: No edema.     Comments: She is wearing a right ankle brace she does have good strength in the lower extremity.  She does have decreased sensation in L5 dermatome.  She is sitting in a wheelchair today.  Skin:    Findings: No erythema, lesion or rash.  Neurological:     General: No focal deficit present.     Mental Status: She is alert and oriented to person, place, and time.     Sensory: Sensory deficit present.     Motor: No weakness or abnormal muscle tone.     Coordination: Coordination normal.  Psychiatric:        Mood and Affect: Mood normal.        Behavior: Behavior normal.      Imaging: XR C-ARM NO REPORT  Result Date: 04/27/2020 Please see Notes tab for imaging impression.

## 2020-04-27 NOTE — Patient Instructions (Signed)

## 2020-04-27 NOTE — Procedures (Signed)
Lumbosacral Transforaminal Epidural Steroid Injection - Sub-Pedicular Approach with Fluoroscopic Guidance  Patient: Tonya Jenkins      Date of Birth: [birth year] MRN: [id number] PCP: [name], MD      Visit Date: 04/27/2020   Universal Protocol:    Date/Time: 04/27/2020  Consent Given By: the patient  Position: PRONE  Additional Comments: Vital signs were monitored before and after the procedure. Patient was prepped and draped in the usual sterile fashion. The correct patient, procedure, and site was verified.   Injection Procedure Details:   Procedure diagnoses: Lumbar radiculopathy [M54.16]    Meds Administered:  Meds ordered this encounter  Medications  . methylPREDNISolone acetate (DEPO-MEDROL) injection 80 mg    Laterality: Right  Location/Site:  L4-L5 L5-S1  Needle:5.0 in., 22 ga.  Short bevel or Quincke spinal needle  Needle Placement: Transforaminal  Findings:    -Comments: Excellent flow of contrast along the nerve, nerve root and into the epidural space.  The L4 injection was fairly normal in response she did get some referred pressure.  The L5 injection however with any instillation of medication caused a pretty exaggerated response of initial screaming pain.  We were able to deliver some injectate into that area and it looks fairly well.  After the procedure she did quite well.  Procedure Details: After squaring off the end-plates to get a true AP view, the C-arm was positioned so that an oblique view of the foramen as noted above was visualized. The target area is just inferior to the "nose of the scotty dog" or sub pedicular. The soft tissues overlying this structure were infiltrated with 2-3 ml. of 1% Lidocaine without Epinephrine.  The spinal needle was inserted toward the target using a "trajectory" view along the fluoroscope beam.  Under AP and lateral visualization, the needle was advanced so it did not puncture dura and was located close the 6  O'Clock position of the pedical in AP tracterory. Biplanar projections were used to confirm position. Aspiration was confirmed to be negative for CSF and/or blood. A 1-2 ml. volume of Isovue-250 was injected and flow of contrast was noted at each level. Radiographs were obtained for documentation purposes.   After attaining the desired flow of contrast documented above, a 0.5 to 1.0 ml test dose of 0.25% Marcaine was injected into each respective transforaminal space.  The patient was observed for 90 seconds post injection.  After no sensory deficits were reported, and normal lower extremity motor function was noted,   the above injectate was administered so that equal amounts of the injectate were placed at each foramen (level) into the transforaminal epidural space.   Additional Comments:  The patient tolerated the procedure well Dressing: 2 x 2 sterile gauze and Band-Aid    Post-procedure details: Patient was observed during the procedure. Post-procedure instructions were reviewed.  Patient left the clinic in stable condition.

## 2020-04-27 NOTE — Progress Notes (Signed)
Pt state lower back that travels down her right hip, leg and foot. Pt state walking and standing makes the pain worse. Pt state she sits or laying down makes the pain worse. Pt had a recent fall. Pt has hx of inj on 02/11/20 pt state it worked until now.   Numeric Pain Rating Scale and Functional Assessment Average Pain 4   In the last MONTH (on 0-10 scale) has pain interfered with the following?  1. General activity like being  able to carry out your everyday physical activities such as walking, climbing stairs, carrying groceries, or moving a chair?  Rating(10)   +Driver, -BT, -Dye Allergies.

## 2020-04-30 ENCOUNTER — Telehealth: Payer: Self-pay | Admitting: Physical Medicine and Rehabilitation

## 2020-04-30 NOTE — Telephone Encounter (Signed)
She should likely not have another epidural. I can document with note

## 2020-04-30 NOTE — Telephone Encounter (Signed)
The patient was transferred to me from triage. She states that she had a reaction to her injection on 2/21.  She states that she had suicidal thoughts and very "depressing, dark" thoughts." She states that she is better now. She states that she is better now. She states that she has a masters in counseling and was able to "talk herself down," but she almost called 911. She states that she couldn't sleep the night of the injection and that is when the thoughts started. I did advise that I would send the message to you as she wants to make sure it is documented that she "had a reaction," and I advised that if she has those thoughts again she should call 911.

## 2020-05-08 ENCOUNTER — Telehealth: Payer: Self-pay

## 2020-05-08 ENCOUNTER — Telehealth: Payer: Self-pay | Admitting: Family Medicine

## 2020-05-08 NOTE — Telephone Encounter (Signed)
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    There's still a chance that some of the weakness could be coming from the spine, but I'd like to have you see Dr. Marlou Sa in our office to evaluate the nerve as it passes across the knee.

## 2020-05-08 NOTE — Telephone Encounter (Signed)
Can you please schedule?

## 2020-05-08 NOTE — Telephone Encounter (Signed)
called patient lvm for patient to call back & schedule a appointment with Dr.Dean

## 2020-05-11 ENCOUNTER — Telehealth: Payer: Self-pay | Admitting: Family Medicine

## 2020-05-11 NOTE — Telephone Encounter (Signed)
I called and advised the patient of this and cancelled Wednesday's appointment with Dr. Junius Roads. She did ask if we could get her in sooner with Dr. Marlou Sa. I told her I would keep an eye out on his schedule and let her know if a sooner appointment opens up.

## 2020-05-11 NOTE — Telephone Encounter (Signed)
Patient called scheduled her appointment with Dr Marlou Sa.  Patient asked if she need to keep her appointment on 05/13/2020 with Dr Junius Roads? The number to contact patient is [phone]

## 2020-05-11 NOTE — Telephone Encounter (Signed)
She can cancel with me and wait for Dr. Randel Pigg input.

## 2020-05-13 ENCOUNTER — Ambulatory Visit: Payer: Medicare Other | Admitting: Family Medicine

## 2020-05-15 ENCOUNTER — Ambulatory Visit (INDEPENDENT_AMBULATORY_CARE_PROVIDER_SITE_OTHER): Payer: Medicare Other | Admitting: Orthopedic Surgery

## 2020-05-15 ENCOUNTER — Ambulatory Visit (INDEPENDENT_AMBULATORY_CARE_PROVIDER_SITE_OTHER): Payer: Medicare Other

## 2020-05-15 ENCOUNTER — Other Ambulatory Visit: Payer: Self-pay

## 2020-05-15 DIAGNOSIS — R29898 Other symptoms and signs involving the musculoskeletal system: Secondary | ICD-10-CM | POA: Diagnosis not present

## 2020-05-16 ENCOUNTER — Encounter: Payer: Self-pay | Admitting: Orthopedic Surgery

## 2020-05-16 NOTE — Progress Notes (Signed)
Office Visit Note   Patient: Tonya Jenkins           Date of Birth: 1945-10-21           MRN: [id number] Visit Date: 05/15/2020 Requested by: [name], MD [place],  [place] [id number] PCP: [name], MD  Subjective: Chief Complaint  Patient presents with  . Right Knee - Pain  . Right Leg - Pain    HPI: Suzetta presents for follow-up of right lower extremity weakness and falling.  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  She has had 2 injections with Dr. Ernestina Patches which helped and the last injection did not help much at all.  Since have seen her she is also had MRI scan of the ankle which shows ATFL chronic injury along with split tearing of the peroneus brevis below the malleolus.  She also has had nerve conduction study which shows evidence of either common peroneal nerve compression or L5 root compression.  She continues to have falling episodes.              ROS: All systems reviewed are negative as they relate to the chief complaint within the history of present illness.  Patient denies  fevers or chills.   Assessment & Plan: Visit Diagnoses:  1. Right leg weakness     Plan: Impression is right lower extremity weakness with falling episodes.  She has been wearing an ankle brace.  MRI of the ankle does show some ATFL chronic laxity and peroneus brevis split tearing but that really does not explain the eversion weakness that she has.  Anterior tib function and EHL function symmetric bilaterally.  No tenderness around the peroneal nerve at the right knee.  Based on MRI scanning and physical exam I think it is most likely that her symptoms are coming from L5 radiculopathy on the right-hand side giving her eversion weakness.  Structurally the ankle looks okay.  Favor neurosurgical evaluation for further treatment.  I do not think she has a predictably helpful orthopedic intervention that would make a  significant difference for her.  Follow-up with [place] as needed  Follow-Up Instructions: Return if symptoms worsen or fail to improve.   Orders:  Orders Placed This Encounter  Procedures  . XR Knee 1-2 Views Right  . Ambulatory referral to Neurosurgery   No orders of the defined types were placed in this encounter.     Procedures: No procedures performed   Clinical Data: No additional findings.  Objective: Vital Signs: There were no vitals taken for this visit.  Physical Exam:   Constitutional: Patient appears well-developed HEENT:  Head: Normocephalic Eyes:EOM are normal Neck: Normal range of motion Cardiovascular: Normal rate Pulmonary/chest: Effort normal Neurologic: Patient is alert Skin: Skin is warm Psychiatric: Patient has normal mood and affect    Ortho Exam: Ortho exam demonstrates eversion weakness on the right compared to the left at 4 out of 5.  Ankle dorsiflexion is 5+ out of 5 bilaterally.  Inversion strength 5+ out of 5 bilaterally along with plantar flexion strength.  She does have paresthesias in the lateral leg region in the L5 distribution in the L4 distribution on the right compared to the left.  Primarily L5 distribution.  No nerve root tension signs today.  Knee exam is normal.  Slightly more laxity to varus stress on the right compared to the left.  Specialty Comments:  No specialty comments  available.  Imaging: XR Knee 1-2 Views Right  Result Date: 05/16/2020 AP lateral right knee radiographs reviewed.  Alignment intact.  No significant arthritis is present.  No fracture.  Patella height normal relative to distal femur.    PMFS History: Patient Active Problem List   Diagnosis Date Noted  . Closed fracture of metatarsal bone 12/10/2018  . Right foot pain 02/05/2018  . Hallux valgus of right foot 11/09/2017  . Osteoarthritis of knee 10/14/2015   Past Medical History:  Diagnosis Date  . History of hip surgery   . Hx of neck surgery   .  Hypertension     No family history on file.  Past Surgical History:  Procedure Laterality Date  . CERVICAL SPINE SURGERY    . HAMMER TOE SURGERY    . HIP SURGERY    . KNEE SURGERY     Social History   Occupational History  . Not on file  Tobacco Use  . Smoking status: Never Smoker  . Smokeless tobacco: Never Used  Substance and Sexual Activity  . Alcohol use: Not Currently  . Drug use: Not Currently  . Sexual activity: Not on file

## 2020-06-10 ENCOUNTER — Telehealth: Payer: Self-pay | Admitting: Family Medicine

## 2020-06-10 ENCOUNTER — Encounter: Payer: Self-pay | Admitting: Family Medicine

## 2020-06-10 DIAGNOSIS — M1712 Unilateral primary osteoarthritis, left knee: Secondary | ICD-10-CM

## 2020-06-10 DIAGNOSIS — M1711 Unilateral primary osteoarthritis, right knee: Secondary | ICD-10-CM

## 2020-06-10 NOTE — Telephone Encounter (Signed)
Requesting approval for gel injections for bilateral knee DJD.

## 2020-06-11 NOTE — Telephone Encounter (Signed)
Noted  

## 2020-06-30 ENCOUNTER — Telehealth: Payer: Self-pay

## 2020-06-30 NOTE — Telephone Encounter (Signed)
VOB has been submitted for SynviscOne, bilateral knee. Pending BV.

## 2020-07-16 ENCOUNTER — Telehealth: Payer: Self-pay | Admitting: Family Medicine

## 2020-07-16 NOTE — Telephone Encounter (Signed)
Patient called. She would like to know if the gel injections has been approved or not. Her call back number is [phone]

## 2020-07-17 ENCOUNTER — Telehealth: Payer: Self-pay

## 2020-07-17 NOTE — Telephone Encounter (Signed)
Called and left a VM for patient to call back to schedule an appointment with Dr. Junius Roads for gel injection.

## 2020-07-17 NOTE — Telephone Encounter (Signed)
Approved for SynviscOne, bilateral knee. [place] deductible has been met Covered at 100% through secondary insurance [employer] after Medicare pays No Co-pay No PA required

## 2020-07-21 ENCOUNTER — Other Ambulatory Visit: Payer: Self-pay

## 2020-07-21 ENCOUNTER — Ambulatory Visit
Admission: RE | Admit: 2020-07-21 | Discharge: 2020-07-21 | Disposition: A | Discharge status: Home / Self Care | Payer: Medicare Other | Source: Ambulatory Visit | Attending: Family Medicine | Admitting: Family Medicine

## 2020-07-21 ENCOUNTER — Other Ambulatory Visit: Payer: Self-pay | Admitting: Family Medicine

## 2020-07-21 DIAGNOSIS — R1030 Lower abdominal pain, unspecified: Secondary | ICD-10-CM

## 2020-07-21 IMAGING — CT CT ABD-PELV W/O CM
1 of 2 series · 13 of 32 positions shown, 17 images · non-contrast
Comparison: None.

CLINICAL DATA: Generalized abdominal pain for 3 days with anorexia,
nausea and left lower quadrant pain. Concern for diverticulitis.

EXAM:
CT ABDOMEN AND PELVIS WITHOUT CONTRAST
TECHNIQUE: Multidetector CT imaging of the abdomen and pelvis was performed
following the standard protocol without IV contrast.

[Series 2: abd/pelvis w/(date) · axial · 0.74mm/px · z∈[-388,-38]mm · 13 of 80 slices shown, 17 images]
[im 5/80  soft-tissue]
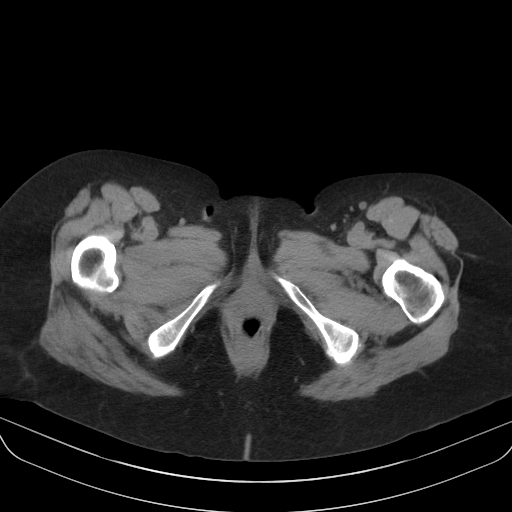
[im 5/80  bone]
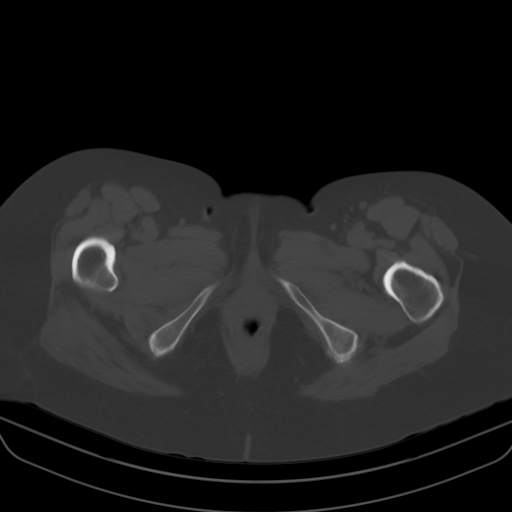
[im 13/80  soft-tissue]
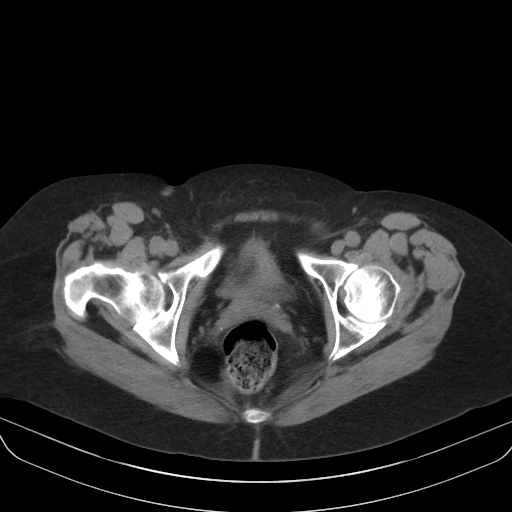
[im 21/80  soft-tissue]
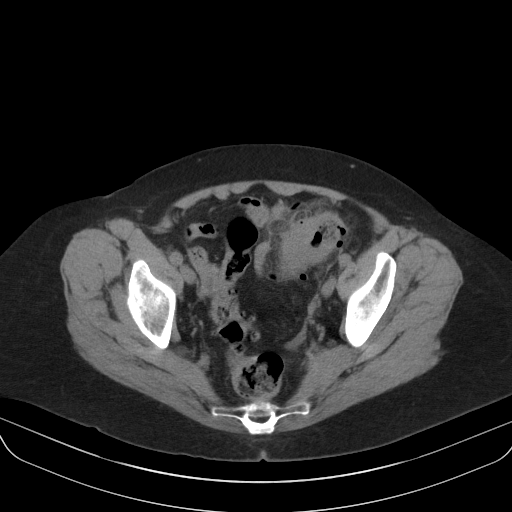
[im 25/80  soft-tissue]
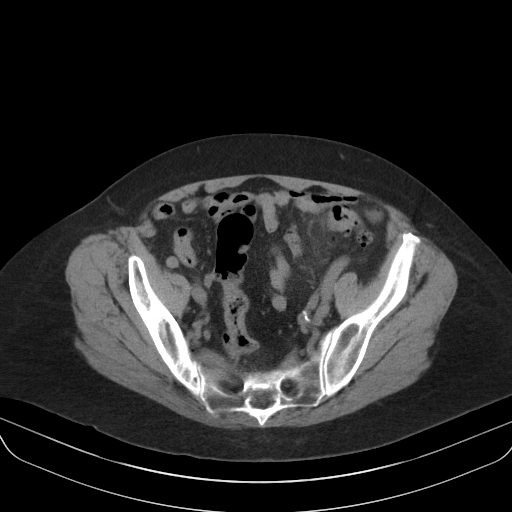
[im 34/80  soft-tissue]
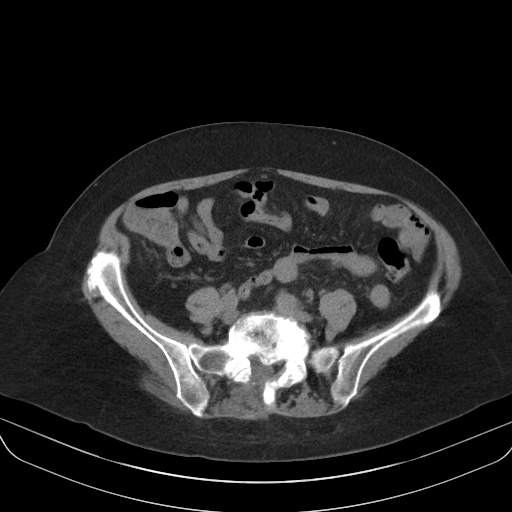
[im 42/80  soft-tissue]
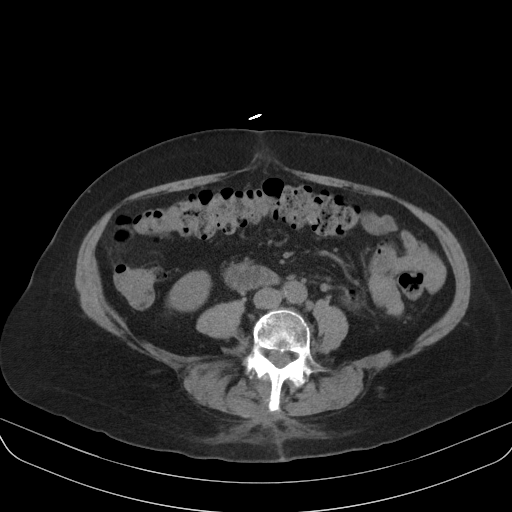
[im 46/80  soft-tissue]
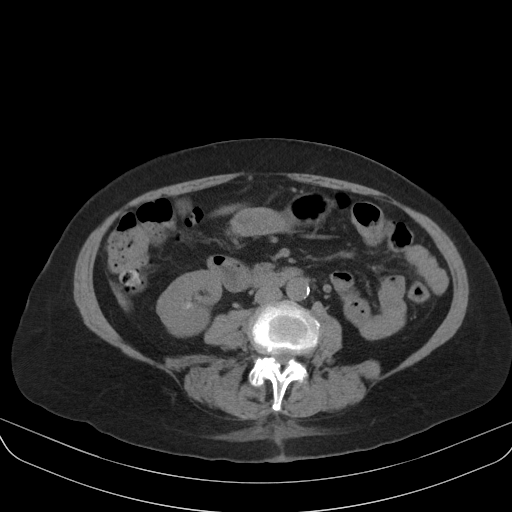
[im 55/80  soft-tissue]
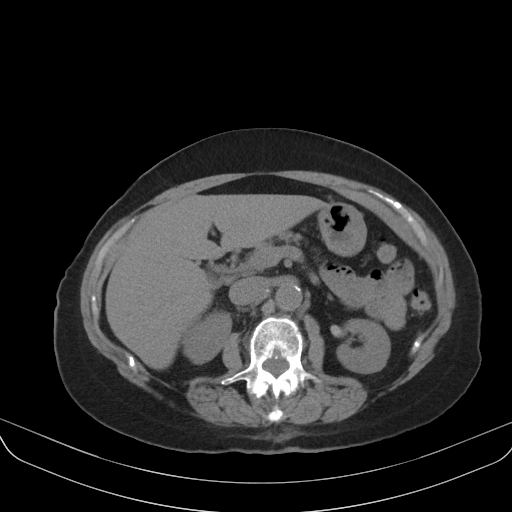
[im 59/80  soft-tissue]
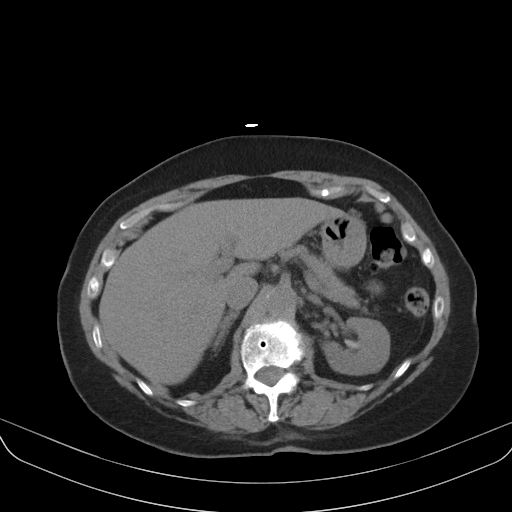
[im 59/80  bone]
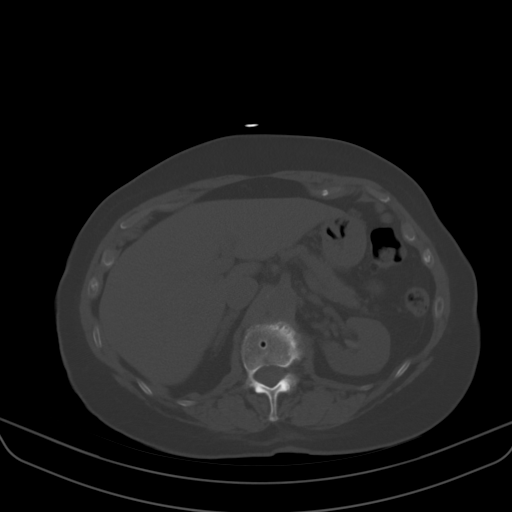
[im 63/80  lung]
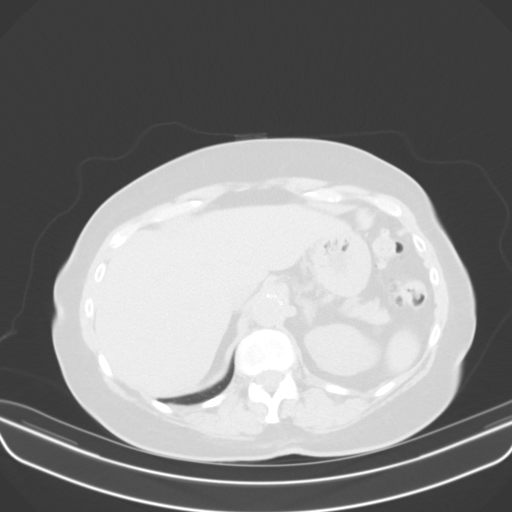
[im 67/80  soft-tissue]
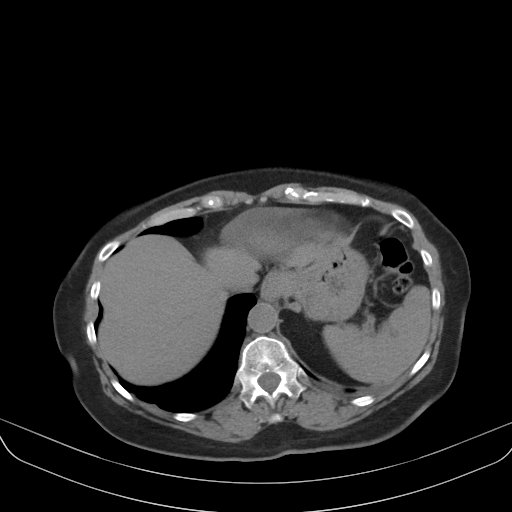
[im 67/80  lung]
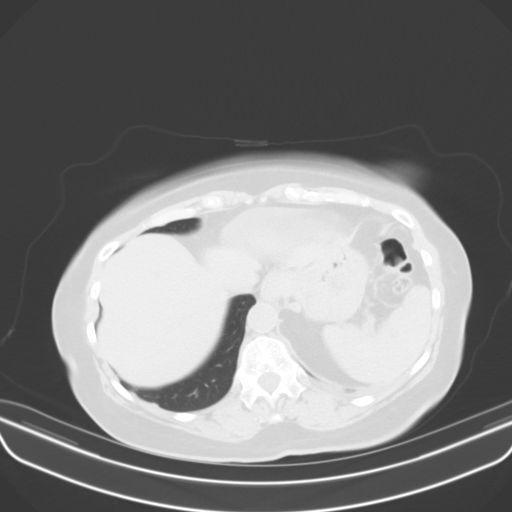
[im 71/80  lung]
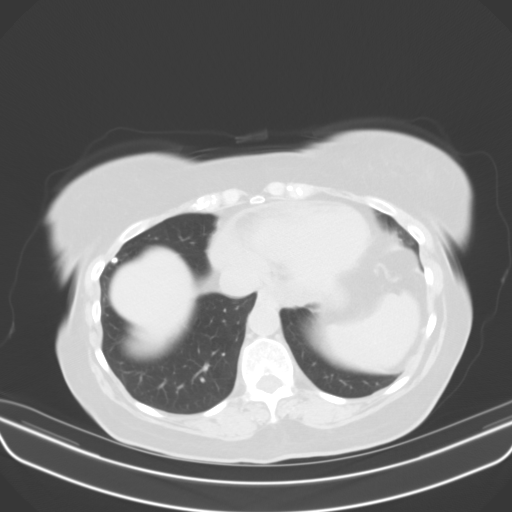
[im 75/80  soft-tissue]
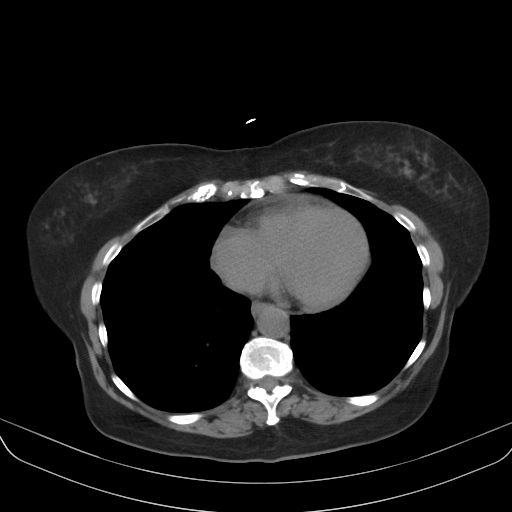
[im 75/80  lung]
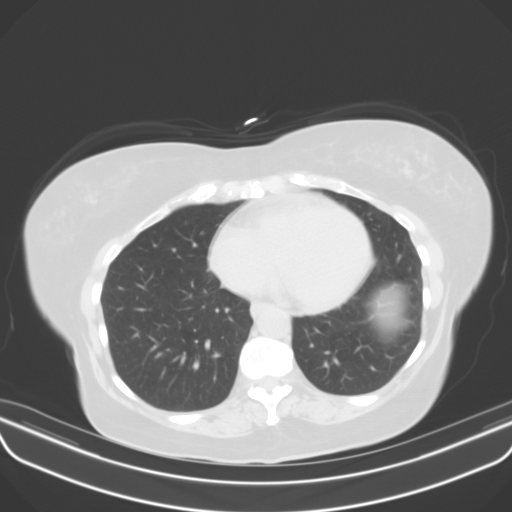

[13 of 32 positions shown; findings below may reference images not displayed]

FINDINGS: Lower chest: Calcified granuloma present in the right lung base
([DATE]). No concerning pulmonary nodules or masses. Small pericardial
effusion. Normal cardiac size. Distal thoracic aortic
atherosclerosis.

Hepatobiliary: No worrisome focal liver abnormality is seen. Normal
gallbladder. No visible calcified gallstones. No biliary ductal
dilatation.

Pancreas: No pancreatic ductal dilatation or surrounding
inflammatory changes.

Spleen: Multiple punctate calcified splenic granulomata. Small
accessory splenule. No concerning lesions. Normal splenic size.

Adrenals/Urinary Tract: Normal adrenals. Small fluid attenuation
renal cysts. No worrisome visible or contour deforming renal
lesions. No urolithiasis or hydronephrosis is seen. High attenuation
material is seen within the collecting system. Nonspecific,
correlate for recent contrast administration. Bladder decompressed
and partially opacified by intermediate attenuation. Suspect some
reactive thickening along the bladder dome

Stomach/Bowel: Distal esophagus, stomach and duodenal sweep are
unremarkable. No small bowel wall thickening or dilatation. No
evidence of obstruction. A normal appendix is visualized. Numerous
distal colonic diverticula. The circumferential thickening and
pericolonic inflammatory phlegmon centered upon what may reflect
culprit diverticulum in the left lower quadrant (2/60, 4/69). No
extraluminal gas or organized abscess or collection. No resulting
obstruction. Remaining remaining portions of the colon are
unremarkable.

Vascular/Lymphatic: Atherosclerotic calcifications within the
abdominal aorta and branch vessels. No aneurysm or ectasia. No
enlarged abdominopelvic lymph nodes.

Reproductive: Anteverted uterus. Some parametrial vascular
calcifications, age-appropriate. No concerning adnexal lesions.

Other: Phlegmon centered upon the sigmoid colon, as described above.
No free air. No free fluid. No organized abscess or collection.
Postsurgical changes in the posterior midline soft tissues. Tiny fat
containing umbilical hernia. No bowel containing hernia.

Musculoskeletal: Multilevel degenerative changes are present in the
imaged portions of the spine. A grade 1 anterolisthesis L3 on L4 and
L4 on L5 without spondylolysis. Minimal retrolisthesis L5 on S1.
Interspinous arthrosis compatible with Baastrup's disease.
IMPRESSION: Circumferential thickening and inflammation centered upon the
sigmoid colon in a region of numerous colonic diverticula. Findings
favor an acute uncomplicated diverticulitis without evidence of
perforation or abscess formation. Outpatient evaluation with
colonoscopy following resolution of symptoms should be considered.

High attenuation material within the urinary collecting system,
nonspecific, correlate for recent contrast administration, otherwise
consider urinalysis.

Small pericardial effusion.

Aortic Atherosclerosis ([PT]-[PT]).

Evidence of prior granulomatous disease in the chest and abdomen.

Degenerative changes in the spine as above.

These results will be called to the ordering clinician or
representative by the Radiologist Assistant, and communication
documented in the PACS or [REDACTED].

## 2020-09-03 ENCOUNTER — Encounter: Payer: Self-pay | Admitting: Family Medicine

## 2020-09-09 ENCOUNTER — Ambulatory Visit (INDEPENDENT_AMBULATORY_CARE_PROVIDER_SITE_OTHER): Payer: Medicare Other | Admitting: Family Medicine

## 2020-09-09 ENCOUNTER — Other Ambulatory Visit: Payer: Self-pay

## 2020-09-09 ENCOUNTER — Encounter: Payer: Self-pay | Admitting: Family Medicine

## 2020-09-09 ENCOUNTER — Ambulatory Visit (INDEPENDENT_AMBULATORY_CARE_PROVIDER_SITE_OTHER): Payer: Medicare Other

## 2020-09-09 DIAGNOSIS — M542 Cervicalgia: Secondary | ICD-10-CM

## 2020-09-09 NOTE — Progress Notes (Signed)
Office Visit Note   Patient: Tonya Jenkins           Date of Birth: 02/02/1946           MRN: [id number] Visit Date: 09/09/2020 Requested by: [name], MD [place],  [place] [id number] PCP: [name], MD  Subjective: Chief Complaint  Patient presents with   Neck - Injury, Pain    9 days ago, a medicine cabinet fell off the wall, landing directly on the top of her head. H/o surgery on her neck (has hardware). Had a dull headache x 1 week. Still has stiffness in the neck.     HPI: She is here with neck pain.  About 9 days ago a medicine cabinet fell off the wall landing directly on top of her head.  It was very heavy and she was keeping it propped up with her head until a friend was able to help her.  She has had some neck pain and a dull headache since then, no loss of consciousness.  She is status post cervical fusion from C3-C6 couple years ago.  She denies any radicular symptoms in her arms.                ROS:   All other systems were reviewed and are negative.  Objective: Vital Signs: There were no vitals taken for this visit.  Physical Exam:  General:  Alert and oriented, in no acute distress. Pulm:  Breathing unlabored. Psy:  Normal mood, congruent affect.  Neck: She has pain at the extremes of rotation to the left.  She has a tender trigger point to the right of midline at about C4-5.  There is no bony tenderness over the spinous processes.  Upper extremity strength and reflexes are normal.  Imaging: XR Cervical Spine 2 or 3 views  Result Date: 09/09/2020 X-rays of the cervical spine reveal an old spinous process avulsion fragment that was present on CT images from 2020.  The fusion hardware looks stable to me, I do not think there has been any hardware fracture or spine fracture, and the appearance of the hardware appears similar to the CT images from 2020.   Assessment & Plan: 9 days status post head contusion with cervical spine sprain/strain.   I do not think she damaged her surgical hardware. -I will ask one of our spine surgeons to review her x-rays as well.  If her muscular pain persist, she will contact me for physical therapy referral.  Otherwise I will see her back as needed.     Procedures: No procedures performed        PMFS History: Patient Active Problem List   Diagnosis Date Noted   Closed fracture of metatarsal bone 12/10/2018   Right foot pain 02/05/2018   Hallux valgus of right foot 11/09/2017   Osteoarthritis of knee 10/14/2015   Past Medical History:  Diagnosis Date   History of hip surgery    Hx of neck surgery    Hypertension     History reviewed. No pertinent family history.  Past Surgical History:  Procedure Laterality Date   CERVICAL SPINE SURGERY     HAMMER TOE SURGERY     HIP SURGERY     KNEE SURGERY     Social History   Occupational History   Not on file  Tobacco Use   Smoking status: Never   Smokeless tobacco: Never  Substance and Sexual Activity   Alcohol use: Not Currently  Drug use: Not Currently   Sexual activity: Not on file

## 2020-09-10 ENCOUNTER — Encounter: Payer: Self-pay | Admitting: Family Medicine

## 2020-09-22 ENCOUNTER — Telehealth: Payer: Self-pay | Admitting: Physical Medicine and Rehabilitation

## 2020-09-22 NOTE — Telephone Encounter (Signed)
Received vm from [facility] w/ Dr. Lauris Poag office, requesting EMG report. IC,lmvm advising we do not have, Dr. Ernestina Patches only given ESI's. [phone]

## 2020-09-28 ENCOUNTER — Encounter: Payer: Self-pay | Admitting: Family Medicine

## 2020-12-14 ENCOUNTER — Other Ambulatory Visit (HOSPITAL_BASED_OUTPATIENT_CLINIC_OR_DEPARTMENT_OTHER): Payer: Self-pay

## 2020-12-14 DIAGNOSIS — R5383 Other fatigue: Secondary | ICD-10-CM

## 2020-12-14 DIAGNOSIS — G4710 Hypersomnia, unspecified: Secondary | ICD-10-CM

## 2020-12-14 DIAGNOSIS — R0683 Snoring: Secondary | ICD-10-CM

## 2021-01-01 ENCOUNTER — Other Ambulatory Visit: Payer: Self-pay

## 2021-01-01 ENCOUNTER — Ambulatory Visit (HOSPITAL_BASED_OUTPATIENT_CLINIC_OR_DEPARTMENT_OTHER): Payer: Medicare Other | Attending: Sleep Medicine | Admitting: Sleep Medicine

## 2021-01-01 VITALS — Ht 60.0 in | Wt 150.0 lb

## 2021-01-01 DIAGNOSIS — R400 Somnolence: Secondary | ICD-10-CM | POA: Diagnosis present

## 2021-01-01 DIAGNOSIS — R5383 Other fatigue: Secondary | ICD-10-CM

## 2021-01-01 DIAGNOSIS — G4710 Hypersomnia, unspecified: Secondary | ICD-10-CM

## 2021-01-01 DIAGNOSIS — G4733 Obstructive sleep apnea (adult) (pediatric): Secondary | ICD-10-CM | POA: Diagnosis not present

## 2021-01-01 DIAGNOSIS — R0683 Snoring: Secondary | ICD-10-CM | POA: Diagnosis present

## 2021-01-21 NOTE — Procedures (Signed)
   NAME: Tonya Jenkins DATE OF BIRTH:  18-Jun-1945 MEDICAL RECORD NUMBER [id number]  LOCATION: [facility]  PHYSICIAN: Pasqualina Colasurdo D Matalie Romberger  DATE OF STUDY: 01/01/2021  SLEEP STUDY TYPE: Nocturnal Polysomnogram               REFERRING PHYSICIAN: Elmarie Mainland, MD  CLINICAL INFORMATION Tonya Jenkins is a 75 year old Female and was referred to the sleep center for evaluation of obstructive sleep apnea.  MEDICATIONS Patient self administered medications include: N/A. No sleep medicine administered.  SLEEP STUDY TECHNIQUE A multi-channel overnight Polysomnography study was performed. The channels recorded and monitored were central and occipital EEG, electrooculogram (EOG), submentalis EMG (chin), nasal and oral airflow, thoracic and abdominal wall motion, anterior tibialis EMG, snore microphone, electrocardiogram, and a pulse oximetry. TECHNICAL COMMENTS Comments added by Technician: Patient was restless all through the night. Patient had difficulty initiating sleep. SLEEP ARCHITECTURE The study was initiated at 10:25:48 PM and terminated at 4:21:02 AM. The total recorded time was 355.2 minutes. EEG confirmed total sleep time was 196 minutes yielding a sleep efficiency of 55.2%. Sleep onset after lights out was 9.0 minutes with a REM latency of 97.5 minutes. The patient spent 2.8% of the night in stage N1 sleep, 81.4% in stage N2 sleep, 0.0% in stage N3 and 15.8% in REM. Wake after sleep onset (WASO) was 150.2 minutes. The Arousal Index was 15.6/hour. RESPIRATORY PARAMETERS There were a total of 9 respiratory disturbances out of which 1 were apneas (0 obstructive, 0 mixed, 1 central) and 8 hypopneas. The apnea/hypopnea index (AHI) was 2.8 events/hour. The central sleep apnea index was 0.3 events/hour. The REM AHI was 13.5 events/hour and NREM AHI was 0.7 events/hour. The supine AHI was 4.3 events/hour and the non supine AHI was 0.7 supine during 56.89% of sleep. Respiratory  disturbance index is 8.6 events/hour and associated with oxygen desaturation down to a nadir of 83.0% during sleep. The mean oxygen saturation during the study was 94.6%.  LEG MOVEMENT DATA The total leg movements were 45 with a resulting leg movement index of 13.8/hr. Associated arousal with leg movement index was 2.1/hr.  CARDIAC DATA The underlying cardiac rhythm was most consistent with sinus rhythm. Mean heart rate during sleep was 64.5 bpm. Additional rhythm abnormalities include PVCs. IMPRESSIONS - Mild Obstructive Sleep apnea (OSA) based on RDI DIAGNOSIS - Obstructive Sleep Apnea (G47.33) RECOMMENDATIONS - Therapeutic CPAP titration to determine optimal pressure required to alleviate sleep disordered breathing. - Avoid alcohol, sedatives and other CNS depressants that may worsen sleep apnea and disrupt normal sleep architecture. - Sleep hygiene should be reviewed to assess factors that may improve sleep quality. - Weight management and regular exercise should be initiated or continued.  Tonya Jenkins D Tityana Pagan Diplomate, American Board of Internal Medicine  ELECTRONICALLY SIGNED ON:  01/21/2021, 1:11 PM [place] PH: [phone]   FX: [phone] [place]

## 2021-02-18 ENCOUNTER — Other Ambulatory Visit: Payer: Self-pay | Admitting: Family Medicine

## 2021-02-18 DIAGNOSIS — Z780 Asymptomatic menopausal state: Secondary | ICD-10-CM

## 2021-04-06 ENCOUNTER — Other Ambulatory Visit: Payer: Medicare Other

## 2021-05-11 ENCOUNTER — Emergency Department (HOSPITAL_COMMUNITY): Payer: Medicare Other

## 2021-05-11 ENCOUNTER — Other Ambulatory Visit: Payer: Self-pay

## 2021-05-11 ENCOUNTER — Inpatient Hospital Stay (HOSPITAL_COMMUNITY)
Admission: EM | Admit: 2021-05-11 | Discharge: 2021-05-17 | DRG: 435 | Disposition: A | Discharge status: Home Health | Payer: Medicare Other | Attending: Internal Medicine | Admitting: Internal Medicine

## 2021-05-11 DIAGNOSIS — J90 Pleural effusion, not elsewhere classified: Secondary | ICD-10-CM | POA: Diagnosis present

## 2021-05-11 DIAGNOSIS — N179 Acute kidney failure, unspecified: Secondary | ICD-10-CM | POA: Diagnosis present

## 2021-05-11 DIAGNOSIS — D649 Anemia, unspecified: Secondary | ICD-10-CM | POA: Diagnosis present

## 2021-05-11 DIAGNOSIS — K429 Umbilical hernia without obstruction or gangrene: Secondary | ICD-10-CM | POA: Diagnosis present

## 2021-05-11 DIAGNOSIS — K831 Obstruction of bile duct: Secondary | ICD-10-CM | POA: Diagnosis present

## 2021-05-11 DIAGNOSIS — I10 Essential (primary) hypertension: Secondary | ICD-10-CM | POA: Diagnosis present

## 2021-05-11 DIAGNOSIS — L299 Pruritus, unspecified: Secondary | ICD-10-CM | POA: Diagnosis present

## 2021-05-11 DIAGNOSIS — R8290 Unspecified abnormal findings in urine: Secondary | ICD-10-CM | POA: Diagnosis present

## 2021-05-11 DIAGNOSIS — Z419 Encounter for procedure for purposes other than remedying health state, unspecified: Secondary | ICD-10-CM

## 2021-05-11 DIAGNOSIS — E46 Unspecified protein-calorie malnutrition: Secondary | ICD-10-CM | POA: Diagnosis present

## 2021-05-11 DIAGNOSIS — Z79899 Other long term (current) drug therapy: Secondary | ICD-10-CM

## 2021-05-11 DIAGNOSIS — D72829 Elevated white blood cell count, unspecified: Secondary | ICD-10-CM | POA: Diagnosis present

## 2021-05-11 DIAGNOSIS — E876 Hypokalemia: Secondary | ICD-10-CM | POA: Diagnosis present

## 2021-05-11 DIAGNOSIS — R748 Abnormal levels of other serum enzymes: Secondary | ICD-10-CM | POA: Diagnosis present

## 2021-05-11 DIAGNOSIS — R739 Hyperglycemia, unspecified: Secondary | ICD-10-CM | POA: Diagnosis present

## 2021-05-11 DIAGNOSIS — C259 Malignant neoplasm of pancreas, unspecified: Principal | ICD-10-CM | POA: Diagnosis present

## 2021-05-11 DIAGNOSIS — G4733 Obstructive sleep apnea (adult) (pediatric): Secondary | ICD-10-CM | POA: Diagnosis present

## 2021-05-11 DIAGNOSIS — Z20822 Contact with and (suspected) exposure to covid-19: Secondary | ICD-10-CM | POA: Diagnosis present

## 2021-05-11 DIAGNOSIS — K8689 Other specified diseases of pancreas: Secondary | ICD-10-CM | POA: Diagnosis present

## 2021-05-11 LAB — COMPREHENSIVE METABOLIC PANEL
ALT: 1242 U/L — ABNORMAL HIGH (ref 0–44)
AST: 686 U/L — ABNORMAL HIGH (ref 15–41)
Albumin: 3.6 g/dL (ref 3.5–5.0)
Alkaline Phosphatase: 402 U/L — ABNORMAL HIGH (ref 38–126)
Anion gap: 10 (ref 5–15)
BUN: 19 mg/dL (ref 8–23)
CO2: 26 mmol/L (ref 22–32)
Calcium: 9.4 mg/dL (ref 8.9–10.3)
Chloride: 98 mmol/L (ref 98–111)
Creatinine, Ser: 1.21 mg/dL — ABNORMAL HIGH (ref 0.44–1.00)
GFR, Estimated: 47 mL/min — ABNORMAL LOW (ref >60)
Glucose, Bld: 181 mg/dL — ABNORMAL HIGH (ref 70–99)
Potassium: 3.3 mmol/L — ABNORMAL LOW (ref 3.5–5.1)
Sodium: 134 mmol/L — ABNORMAL LOW (ref 135–145)
Total Bilirubin: 8.7 mg/dL — ABNORMAL HIGH (ref 0.3–1.2)
Total Protein: 7.1 g/dL (ref 6.5–8.1)

## 2021-05-11 LAB — CBC WITH DIFFERENTIAL/PLATELET
Abs Immature Granulocytes: 0.02 10*3/uL (ref 0.00–0.07)
Basophils Absolute: 0.1 10*3/uL (ref 0.0–0.1)
Basophils Relative: 1 %
Eosinophils Absolute: 0.4 10*3/uL (ref 0.0–0.5)
Eosinophils Relative: 5 %
HCT: 34.7 % — ABNORMAL LOW (ref 36.0–46.0)
Hemoglobin: 11.5 g/dL — ABNORMAL LOW (ref 12.0–15.0)
Immature Granulocytes: 0 %
Lymphocytes Relative: 20 %
Lymphs Abs: 1.3 10*3/uL (ref 0.7–4.0)
MCH: 27.2 pg (ref 26.0–34.0)
MCHC: 33.1 g/dL (ref 30.0–36.0)
MCV: 82 fL (ref 80.0–100.0)
Monocytes Absolute: 0.6 10*3/uL (ref 0.1–1.0)
Monocytes Relative: 9 %
Neutro Abs: 4.2 10*3/uL (ref 1.7–7.7)
Neutrophils Relative %: 65 %
Platelets: 307 10*3/uL (ref 150–400)
RBC: 4.23 MIL/uL (ref 3.87–5.11)
RDW: 17.2 % — ABNORMAL HIGH (ref 11.5–15.5)
WBC: 6.6 10*3/uL (ref 4.0–10.5)
nRBC: 0 % (ref 0.0–0.2)

## 2021-05-11 LAB — URINALYSIS, ROUTINE W REFLEX MICROSCOPIC
Bilirubin Urine: NEGATIVE
Glucose, UA: NEGATIVE mg/dL
Ketones, ur: NEGATIVE mg/dL
Nitrite: NEGATIVE
Protein, ur: NEGATIVE mg/dL
Specific Gravity, Urine: 1.009 (ref 1.005–1.030)
pH: 5 (ref 5.0–8.0)

## 2021-05-11 LAB — LIPASE, BLOOD: Lipase: 39 U/L (ref 11–51)

## 2021-05-11 NOTE — ED Triage Notes (Signed)
Pt reported to ED to be evaluated for jaundice. States she was sent by dermatologist.  ?

## 2021-05-11 NOTE — ED Provider Triage Note (Signed)
Emergency Medicine Provider Triage Evaluation Note ? ?Tonya Jenkins , a 76 y.o. female  was evaluated in triage.  Pt complains of pruritus, lesions, jaundice, LUQ pain.  5 days ago patient began feeling itchy from head to toe.  3 days ago began to develop excoriations/lesions, worst on her midsection.  2 days ago began having LUQ pain.  Denies N/V or constipation.  Does not know when she began appearing yellow.  Describes urine as orange-yellow in color with increased frequency.  Describes stools as diarrhea with pale color.  Denies history of chronic alcohol use.  Denies fever or recent illness. ? ?Review of Systems  ?Positive: As above ?Negative: As above ? ?Physical Exam  ?BP (!) 167/87 (BP Location: Left Arm)   Pulse 70   Temp 98 ?F (36.7 ?C)   Resp 16   SpO2 98%  ?Gen:   Awake, no distress   ?Resp:  Normal effort  ?MSK:   Moves extremities without difficulty  ?Other:  Scleral icterus, mild jaundice, LUQ abdominal tenderness, excoriations on the arms legs and stomach with highest concentration on the stomach ? ?Medical Decision Making  ?Medically screening exam initiated at 8:23 PM.  Appropriate orders placed.  Katie Faraone was informed that the remainder of the evaluation will be completed by another provider, this initial triage assessment does not replace that evaluation, and the importance of remaining in the ED until their evaluation is complete. ? ?Labs and imaging ordered ?  ?Prince Rome, PA-C ?96/22/29 2033 ? ?

## 2021-05-12 ENCOUNTER — Other Ambulatory Visit: Payer: Self-pay

## 2021-05-12 ENCOUNTER — Encounter (HOSPITAL_COMMUNITY): Payer: Self-pay | Admitting: Family Medicine

## 2021-05-12 ENCOUNTER — Inpatient Hospital Stay (HOSPITAL_COMMUNITY): Payer: Medicare Other

## 2021-05-12 ENCOUNTER — Emergency Department (HOSPITAL_COMMUNITY): Payer: Medicare Other

## 2021-05-12 DIAGNOSIS — C259 Malignant neoplasm of pancreas, unspecified: Secondary | ICD-10-CM | POA: Diagnosis present

## 2021-05-12 DIAGNOSIS — K8689 Other specified diseases of pancreas: Secondary | ICD-10-CM | POA: Diagnosis present

## 2021-05-12 DIAGNOSIS — R8290 Unspecified abnormal findings in urine: Secondary | ICD-10-CM

## 2021-05-12 DIAGNOSIS — I10 Essential (primary) hypertension: Secondary | ICD-10-CM | POA: Diagnosis present

## 2021-05-12 DIAGNOSIS — K838 Other specified diseases of biliary tract: Secondary | ICD-10-CM | POA: Diagnosis not present

## 2021-05-12 DIAGNOSIS — D72829 Elevated white blood cell count, unspecified: Secondary | ICD-10-CM | POA: Diagnosis present

## 2021-05-12 DIAGNOSIS — D649 Anemia, unspecified: Secondary | ICD-10-CM | POA: Diagnosis not present

## 2021-05-12 DIAGNOSIS — R739 Hyperglycemia, unspecified: Secondary | ICD-10-CM | POA: Diagnosis not present

## 2021-05-12 DIAGNOSIS — R748 Abnormal levels of other serum enzymes: Secondary | ICD-10-CM | POA: Diagnosis present

## 2021-05-12 DIAGNOSIS — E876 Hypokalemia: Secondary | ICD-10-CM | POA: Diagnosis present

## 2021-05-12 DIAGNOSIS — G4733 Obstructive sleep apnea (adult) (pediatric): Secondary | ICD-10-CM | POA: Diagnosis not present

## 2021-05-12 DIAGNOSIS — N179 Acute kidney failure, unspecified: Secondary | ICD-10-CM | POA: Diagnosis present

## 2021-05-12 DIAGNOSIS — K831 Obstruction of bile duct: Secondary | ICD-10-CM | POA: Diagnosis present

## 2021-05-12 DIAGNOSIS — E46 Unspecified protein-calorie malnutrition: Secondary | ICD-10-CM | POA: Diagnosis present

## 2021-05-12 DIAGNOSIS — Z9989 Dependence on other enabling machines and devices: Secondary | ICD-10-CM

## 2021-05-12 DIAGNOSIS — L299 Pruritus, unspecified: Secondary | ICD-10-CM | POA: Diagnosis present

## 2021-05-12 LAB — CBG MONITORING, ED: Glucose-Capillary: 121 mg/dL — ABNORMAL HIGH (ref 70–99)

## 2021-05-12 LAB — PROTIME-INR
INR: 0.9 (ref 0.8–1.2)
Prothrombin Time: 12.1 seconds (ref 11.4–15.2)

## 2021-05-12 LAB — FERRITIN: Ferritin: 336 ng/mL — ABNORMAL HIGH (ref 11–307)

## 2021-05-12 LAB — IRON AND TIBC
Iron: 102 ug/dL (ref 28–170)
Saturation Ratios: 27 % (ref 10.4–31.8)
TIBC: 379 ug/dL (ref 250–450)
UIBC: 277 ug/dL

## 2021-05-12 LAB — HEPATITIS PANEL, ACUTE
HCV Ab: NONREACTIVE
Hep A IgM: NONREACTIVE
Hep B C IgM: NONREACTIVE
Hepatitis B Surface Ag: NONREACTIVE

## 2021-05-12 LAB — HEMOGLOBIN A1C
Hgb A1c MFr Bld: 6.5 % — ABNORMAL HIGH (ref 4.8–5.6)
Mean Plasma Glucose: 139.85 mg/dL

## 2021-05-12 LAB — RESP PANEL BY RT-PCR (FLU A&B, COVID) ARPGX2
Influenza A by PCR: NEGATIVE
Influenza B by PCR: NEGATIVE
SARS Coronavirus 2 by RT PCR: NEGATIVE

## 2021-05-12 LAB — APTT: aPTT: 27 seconds (ref 24–36)

## 2021-05-12 IMAGING — CT CT ABD-PELV W/ CM
2 of 5 series · 16 of 46 positions shown, 18 images · IV contrast (agent unspecified)
Comparison: [DATE]

CLINICAL DATA: Abdominal pain.

EXAM:
CT ABDOMEN AND PELVIS WITH CONTRAST
TECHNIQUE: Multidetector CT imaging of the abdomen and pelvis was performed
using the standard protocol following bolus administration of
intravenous contrast.

[Series 3: abdomen 5.0 · axial · 0.98mm/px · z∈[+720,+1080]mm · 13 of 86 slices shown, 15 images]
[im 7/86  soft-tissue]
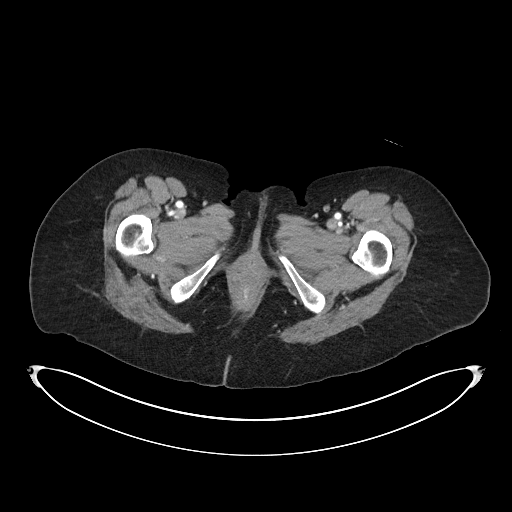
[im 7/86  bone]
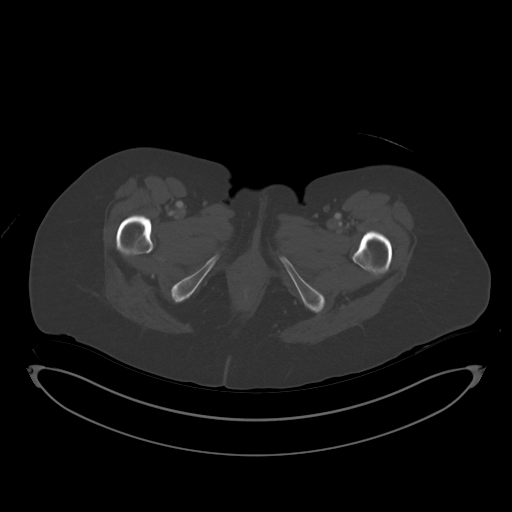
[im 13/86  soft-tissue]
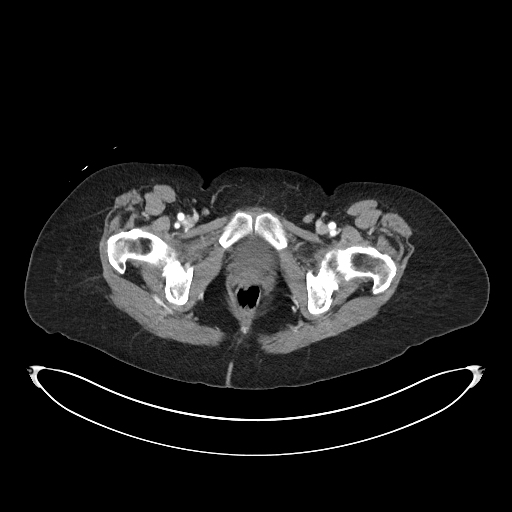
[im 19/86  soft-tissue]
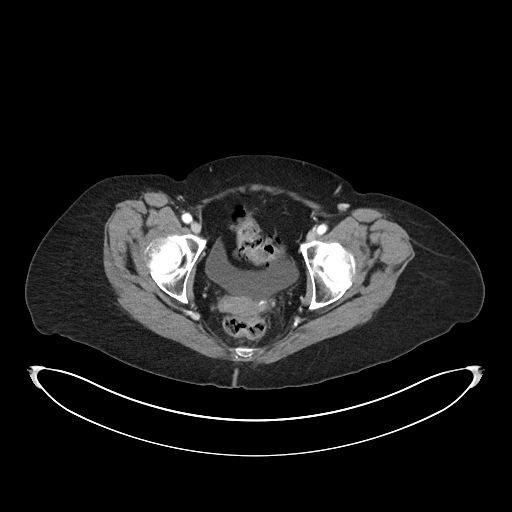
[im 25/86  soft-tissue]
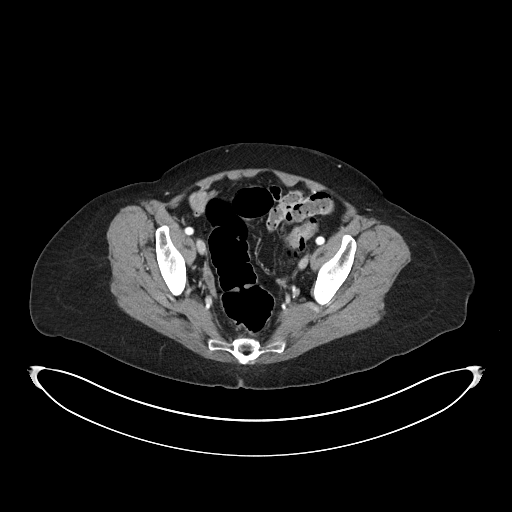
[im 31/86  soft-tissue]
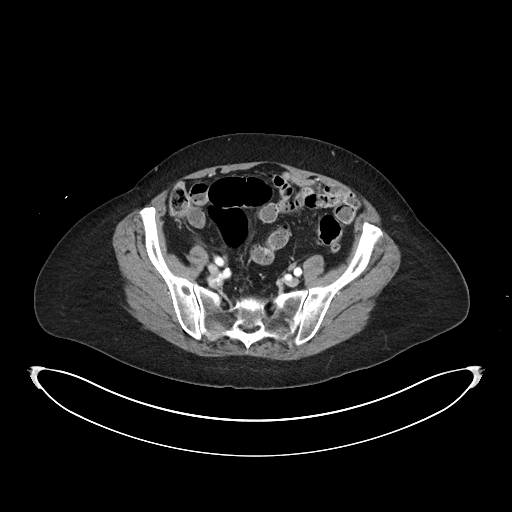
[im 37/86  soft-tissue]
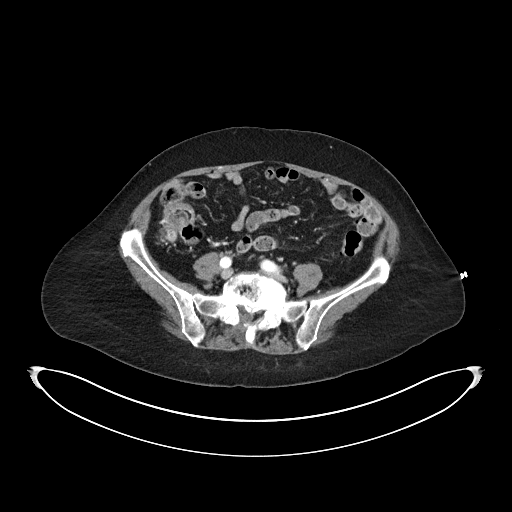
[im 43/86  soft-tissue]
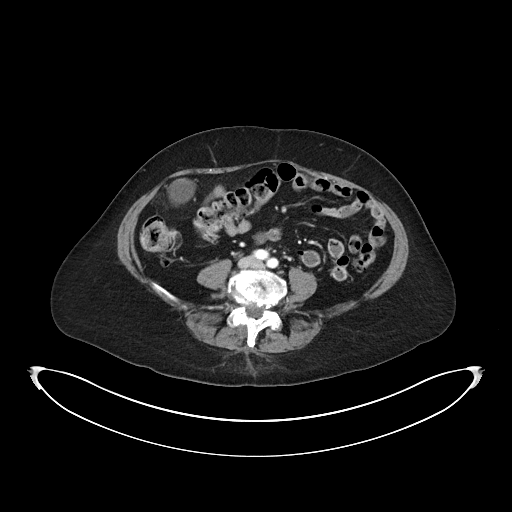
[im 49/86  soft-tissue]
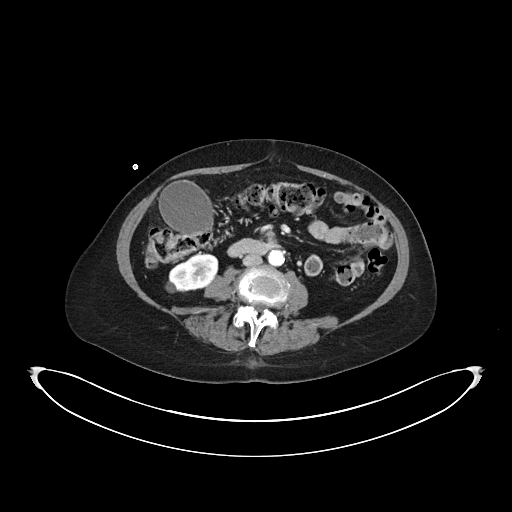
[im 55/86  soft-tissue]
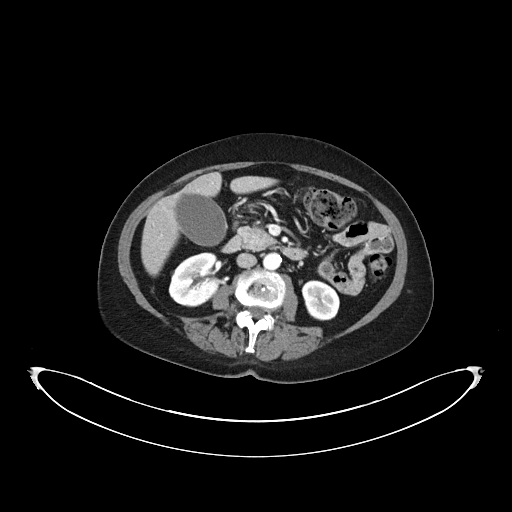
[im 55/86  bone]
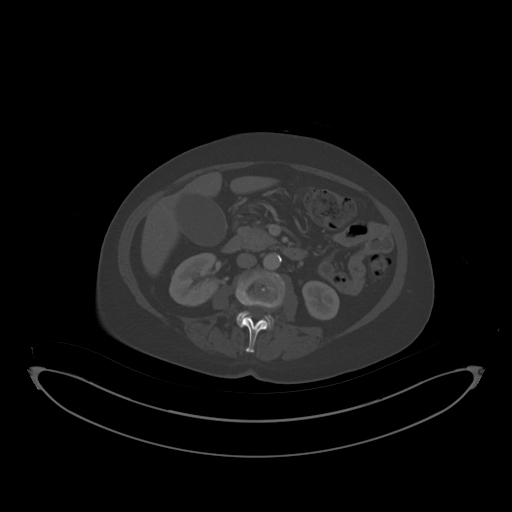
[im 61/86  soft-tissue]
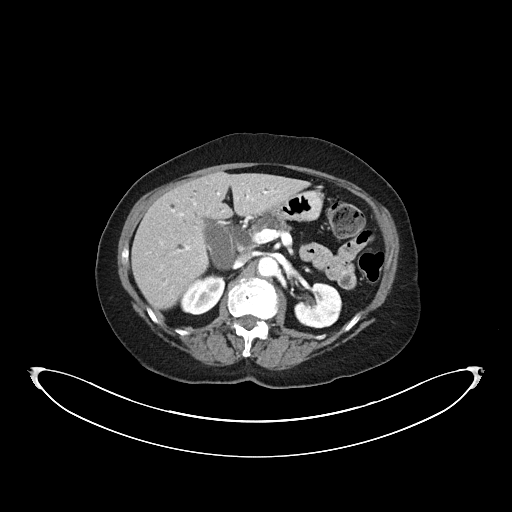
[im 67/86  soft-tissue]
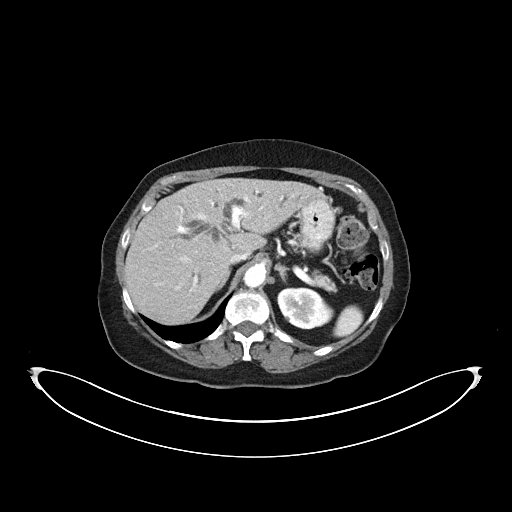
[im 73/86  soft-tissue]
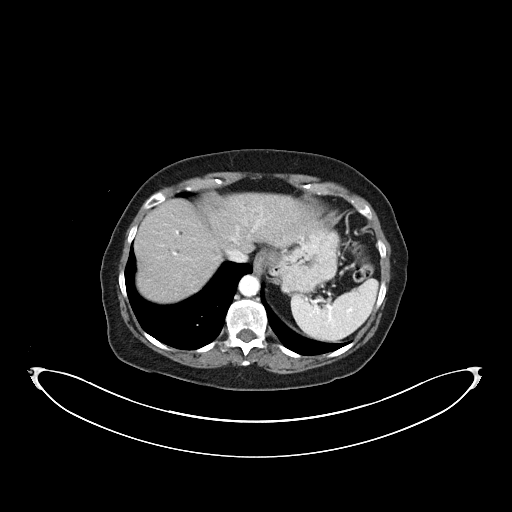
[im 79/86  soft-tissue]
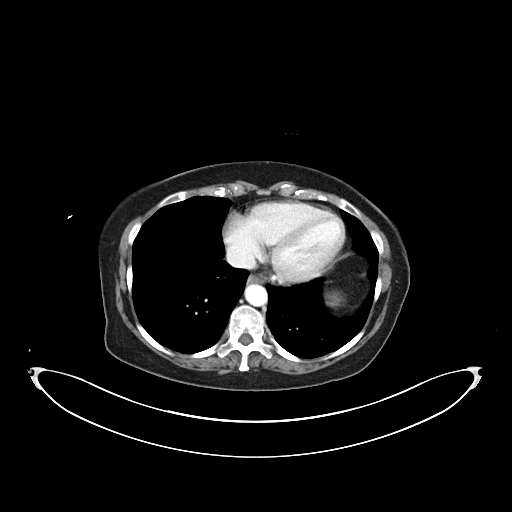

[Series 6: abdomen 3.0 mpr cor · coronal · 0.86mm/px · 3 of 89 slices shown]
[im 30/89  soft-tissue]
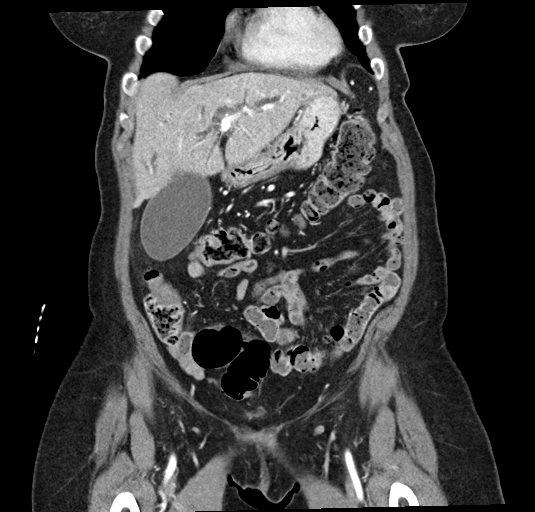
[im 40/89  soft-tissue]
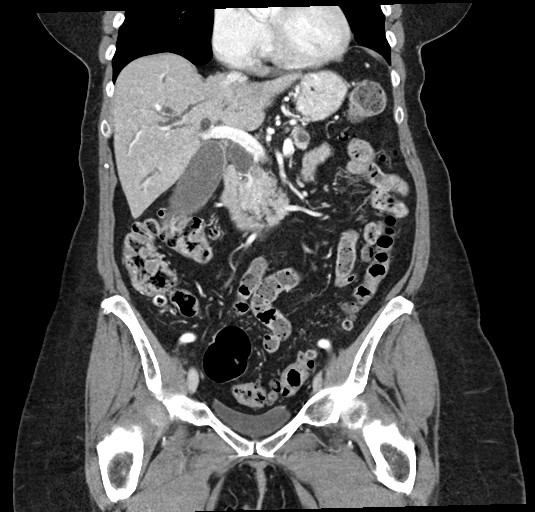
[im 49/89  soft-tissue]
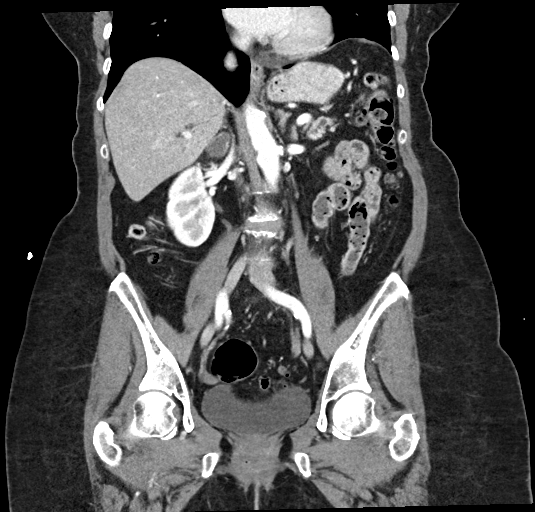

[16 of 46 positions shown; findings below may reference images not displayed]

RADIATION DOSE REDUCTION: This exam was performed according to the
departmental dose-optimization program which includes automated
exposure control, adjustment of the mA and/or kV according to
patient size and/or use of iterative reconstruction technique.

CONTRAST:  90mL OMNIPAQUE IOHEXOL 300 MG/ML  SOLN
FINDINGS: Lower chest: No acute abnormality.

Hepatobiliary: No focal liver abnormality is seen. Marked severity
intrahepatic biliary dilatation is noted. The gallbladder is
markedly distended without evidence of gallstones are gallbladder
wall thickening. The common bile duct is dilated and measures
approximately 13 mm in diameter. Abrupt narrowing of the common bile
duct is seen at the level of the pancreatic head. (Axial CT images
28 through 31, CT series 3. This represents a new finding when
compared to the prior study.

Pancreas: Pancreatic duct is also dilated (12 mm in diameter) with
abrupt narrowing seen within the pancreatic head (axial CT images 30
through 32, CT series 3). This represents a new finding when
compared to the prior exam. Mild prominence of the pancreatic head
is noted without an underlying mass lesion. No peripancreatic
inflammatory fat stranding is seen.

Spleen: Normal in size without focal abnormality.

Adrenals/Urinary Tract: Adrenal glands are unremarkable. Kidneys are
normal in size, without renal calculi or hydronephrosis. Small
bilateral simple renal cysts are noted. Bladder is unremarkable.

Stomach/Bowel: Stomach is within normal limits. Appendix appears
normal. No evidence of bowel wall thickening, distention, or
inflammatory changes. Noninflamed diverticula are seen throughout
the large bowel.

Vascular/Lymphatic: Aortic atherosclerosis. No enlarged abdominal or
pelvic lymph nodes.

Reproductive: Uterus and bilateral adnexa are unremarkable.

Other: There is a 2.1 cm 1.0 cm fat containing umbilical hernia. No
abdominopelvic ascites.

Musculoskeletal: Multilevel degenerative changes are noted
throughout the lumbar spine.
IMPRESSION: 1. Marked severity intrahepatic and extrahepatic biliary dilatation
with an abrupt narrowing of the common bile duct and pancreatic duct
at the level of the pancreatic head. Further correlation with MRCP
is recommended.
2. Markedly distended gallbladder without evidence of cholelithiasis
or acute cholecystitis.
3. Colonic diverticulosis.
4. Small bilateral simple renal cysts.
5. 2.1 cm fat containing umbilical hernia.
6. Aortic atherosclerosis.

Aortic Atherosclerosis ([V9]-[V9]).

## 2021-05-12 MED ORDER — LABETALOL HCL 5 MG/ML IV SOLN
10.0000 mg | INTRAVENOUS | Status: DC | PRN
  Administered 2021-05-12: 10 mg via INTRAVENOUS
  Filled 2021-05-12: qty 4

## 2021-05-12 MED ORDER — ONDANSETRON HCL 4 MG PO TABS: 4.0000 mg | ORAL_TABLET | Freq: Four times a day (QID) | ORAL | Status: DC | PRN

## 2021-05-12 MED ORDER — IRBESARTAN 300 MG PO TABS
300.0000 mg | ORAL_TABLET | Freq: Every day | ORAL | Status: DC
  Administered 2021-05-12 – 2021-05-16 (×5): 300 mg via ORAL
  Filled 2021-05-12 (×5): qty 1

## 2021-05-12 MED ORDER — DILTIAZEM HCL ER COATED BEADS 180 MG PO CP24
180.0000 mg | ORAL_CAPSULE | Freq: Every day | ORAL | Status: DC
  Administered 2021-05-12 – 2021-05-16 (×5): 180 mg via ORAL
  Filled 2021-05-12 (×5): qty 1

## 2021-05-12 MED ORDER — ENOXAPARIN SODIUM 40 MG/0.4ML IJ SOSY
40.0000 mg | PREFILLED_SYRINGE | INTRAMUSCULAR | Status: DC
  Administered 2021-05-14 – 2021-05-16 (×3): 40 mg via SUBCUTANEOUS
  Filled 2021-05-12 (×3): qty 0.4

## 2021-05-12 MED ORDER — POTASSIUM CHLORIDE CRYS ER 20 MEQ PO TBCR
40.0000 meq | EXTENDED_RELEASE_TABLET | ORAL | Status: AC
  Administered 2021-05-12: 40 meq via ORAL
  Filled 2021-05-12: qty 2

## 2021-05-12 MED ORDER — CHOLESTYRAMINE 4 G PO PACK
4.0000 g | PACK | Freq: Two times a day (BID) | ORAL | Status: DC
  Administered 2021-05-12 – 2021-05-13 (×2): 4 g via ORAL
  Filled 2021-05-12 (×10): qty 1

## 2021-05-12 MED ORDER — IOHEXOL 300 MG/ML  SOLN
90.0000 mL | Freq: Once | INTRAMUSCULAR | Status: AC | PRN
  Administered 2021-05-12: 90 mL via INTRAVENOUS

## 2021-05-12 MED ORDER — SODIUM CHLORIDE 0.9 % IV SOLN: INTRAVENOUS | Status: DC

## 2021-05-12 MED ORDER — ONDANSETRON HCL 4 MG/2ML IJ SOLN
4.0000 mg | Freq: Four times a day (QID) | INTRAMUSCULAR | Status: DC | PRN
  Administered 2021-05-13: 22:00:00 4 mg via INTRAVENOUS
  Filled 2021-05-12: qty 2

## 2021-05-12 MED ORDER — MELATONIN 3 MG PO TABS
3.0000 mg | ORAL_TABLET | Freq: Every day | ORAL | Status: DC
  Administered 2021-05-12 – 2021-05-16 (×5): 3 mg via ORAL
  Filled 2021-05-12 (×5): qty 1

## 2021-05-12 MED ORDER — ALBUTEROL SULFATE (2.5 MG/3ML) 0.083% IN NEBU: 2.5000 mg | INHALATION_SOLUTION | Freq: Four times a day (QID) | RESPIRATORY_TRACT | Status: DC | PRN

## 2021-05-12 MED ORDER — LABETALOL HCL 5 MG/ML IV SOLN: 20.0000 mg | INTRAVENOUS | Status: DC | PRN

## 2021-05-12 MED ORDER — GADOBUTROL 1 MMOL/ML IV SOLN
7.0000 mL | Freq: Once | INTRAVENOUS | Status: AC | PRN
  Administered 2021-05-12: 7 mL via INTRAVENOUS

## 2021-05-12 MED ORDER — LORAZEPAM 2 MG/ML IJ SOLN
0.5000 mg | Freq: Once | INTRAMUSCULAR | Status: AC
  Administered 2021-05-12: 0.5 mg via INTRAVENOUS
  Filled 2021-05-12: qty 1

## 2021-05-12 MED ORDER — FENTANYL CITRATE PF 50 MCG/ML IJ SOSY
12.5000 ug | PREFILLED_SYRINGE | INTRAMUSCULAR | Status: DC | PRN
  Filled 2021-05-12: qty 1

## 2021-05-12 MED ORDER — HYDROXYZINE HCL 25 MG PO TABS
25.0000 mg | ORAL_TABLET | Freq: Three times a day (TID) | ORAL | Status: DC | PRN
  Administered 2021-05-15 – 2021-05-17 (×2): 25 mg via ORAL
  Filled 2021-05-12 (×2): qty 1

## 2021-05-12 MED ORDER — SODIUM CHLORIDE 0.9% FLUSH
3.0000 mL | Freq: Two times a day (BID) | INTRAVENOUS | Status: DC
  Administered 2021-05-12 – 2021-05-16 (×5): 3 mL via INTRAVENOUS

## 2021-05-12 MED ORDER — ACETAMINOPHEN 325 MG PO TABS: 650.0000 mg | ORAL_TABLET | Freq: Four times a day (QID) | ORAL | Status: DC | PRN

## 2021-05-12 MED ORDER — ACETAMINOPHEN 650 MG RE SUPP: 650.0000 mg | Freq: Four times a day (QID) | RECTAL | Status: DC | PRN

## 2021-05-12 NOTE — H&P (View-Only) (Signed)
UNASSIGNED PATIENT Reason for Consult: Pancreatic head mass Referring Physician: [facility] hospitalist  Tonya Jenkins is an 76 y.o. female.  HPI: [name] is a 76 year old white female with a history of hypertension, OSA who was in her usual state of health till 5 days ago when she developed this intense pruritic rash all over her body. She presented to the emergency room due to the diffuse skin rash that she has had for the last 5 days with intense itching. On evaluation in the ER she was noted to have elevated liver enzymes with an alkaline phosphatase of 4 2 ALT of 1242 AST of 686 and total bili of 8.7 with INR of 0.9.  A CT scan done revealed marked severe intrahepatic and extrahepatic biliary dilatation with abrupt narrowing of the CBD and the PD at the level of the pancreatic head and MRI was recommended the gallbladder was markedly distended without evidence of cholelithiasis or acute cholecystitis and colonic diverticulosis with small bilateral renal cysts were noted along with a 2.1 cm fat-containing umbilical hernia and aortic atherosclerosis. A follow-up MRCP was done that revealed a 3.6 x 2.1 x 4.2 cm enhancing lesion in the head of the pancreas obstructing the pancreatic and the common bile duct concerning for a neoplasm.  Patient's had some abdominal discomfort but denies any weight loss melena or hematochezia.  She denies any nausea vomiting.  She is noted some clay colored stools and dark-colored urine.  She reportedly had a normal colonoscopy done 2 years ago while she lived in [place].  Past Medical History:  Diagnosis Date   History of hip surgery    Hx of neck surgery    Hypertension    Past Surgical History:  Procedure Laterality Date   CERVICAL SPINE SURGERY     HAMMER TOE SURGERY     HIP SURGERY     KNEE SURGERY     No family history on file.  Social History:  reports that she has never smoked. She has never used smokeless tobacco. She reports that  she does not currently use alcohol. She reports that she does not currently use drugs.  Allergies:  Allergies  Allergen Reactions   Amlodipine Besylate     Other reaction(s): severe HA   Hydrochlorothiazide     Other reaction(s): hallucinations   Lexapro [Escitalopram Oxalate]     Pt stated, "It gave me hallucinations"   Oxycodone    Penicillins Other (See Comments)    Medications: I have reviewed the patient's current medications. Prior to Admission:  Medications Prior to Admission  Medication Sig Dispense Refill Last Dose   B Complex Vitamins (VITAMIN B COMPLEX PO) Take 1 capsule by mouth daily.   05/10/2021   diltiazem (DILACOR XR) 180 MG 24 hr capsule Take 180 mg by mouth at bedtime.   05/10/2021   melatonin 3 MG TABS tablet Take 3 mg by mouth at bedtime.   Past Week   NON FORMULARY CPAP at bedtime      olmesartan (BENICAR) 40 MG tablet Take 40 mg by mouth at bedtime.   05/10/2021   triamcinolone lotion (KENALOG) 0.1 % Apply 0.1 application. topically See admin instructions. Bid x 7 days   05/11/2021   hydrOXYzine (ATARAX) 25 MG tablet Take 25 mg by mouth every 8 (eight) hours as needed for anxiety or itching.      Scheduled:  cholestyramine  4 g Oral BID   diltiazem  180 mg Oral QHS   enoxaparin (LOVENOX)  injection  40 mg Subcutaneous Q24H   irbesartan  300 mg Oral Daily   melatonin  3 mg Oral QHS   sodium chloride flush  3 mL Intravenous Q12H   Continuous:  sodium chloride 75 mL/hr at 05/12/21 1458    Results for orders placed or performed during the hospital encounter of 05/11/21 (from the past 48 hour(s))  Comprehensive metabolic panel     Status: Abnormal   Collection Time: 05/11/21  8:30 PM  Result Value Ref Range   Sodium 134 (L) 135 - 145 mmol/L   Potassium 3.3 (L) 3.5 - 5.1 mmol/L   Chloride 98 98 - 111 mmol/L   CO2 26 22 - 32 mmol/L   Glucose, Bld 181 (H) 70 - 99 mg/dL    Comment: Glucose reference range applies only to samples taken after fasting for at least 8  hours.   BUN 19 8 - 23 mg/dL   Creatinine, Ser 1.21 (H) 0.44 - 1.00 mg/dL   Calcium 9.4 8.9 - 10.3 mg/dL   Total Protein 7.1 6.5 - 8.1 g/dL   Albumin 3.6 3.5 - 5.0 g/dL   AST 686 (H) 15 - 41 U/L   ALT 1,242 (H) 0 - 44 U/L   Alkaline Phosphatase 402 (H) 38 - 126 U/L   Total Bilirubin 8.7 (H) 0.3 - 1.2 mg/dL   GFR, Estimated 47 (L) >60 mL/min    Comment: (NOTE) Calculated using the CKD-EPI Creatinine Equation (2021)    Anion gap 10 5 - 15    Comment: Performed at [facility], [place] [address], [place], [place] [address]  CBC with Differential     Status: Abnormal   Collection Time: 05/11/21  8:30 PM  Result Value Ref Range   WBC 6.6 4.0 - 10.5 K/uL   RBC 4.23 3.87 - 5.11 MIL/uL   Hemoglobin 11.5 (L) 12.0 - 15.0 g/dL   HCT 34.7 (L) 36.0 - 46.0 %   MCV 82.0 80.0 - 100.0 fL   MCH 27.2 26.0 - 34.0 pg   MCHC 33.1 30.0 - 36.0 g/dL   RDW 17.2 (H) 11.5 - 15.5 %   Platelets 307 150 - 400 K/uL   nRBC 0.0 0.0 - 0.2 %   Neutrophils Relative % 65 %   Neutro Abs 4.2 1.7 - 7.7 K/uL   Lymphocytes Relative 20 %   Lymphs Abs 1.3 0.7 - 4.0 K/uL   Monocytes Relative 9 %   Monocytes Absolute 0.6 0.1 - 1.0 K/uL   Eosinophils Relative 5 %   Eosinophils Absolute 0.4 0.0 - 0.5 K/uL   Basophils Relative 1 %   Basophils Absolute 0.1 0.0 - 0.1 K/uL   Immature Granulocytes 0 %   Abs Immature Granulocytes 0.02 0.00 - 0.07 K/uL    Comment: Performed at [facility] [address] [place], [place] [address]  Lipase, blood     Status: None   Collection Time: 05/11/21  8:30 PM  Result Value Ref Range   Lipase 39 11 - 51 U/L    Comment: Performed at [facility] [address] [place], [place] [address]  Urinalysis, Routine w reflex microscopic Urine, Clean Catch     Status: Abnormal   Collection Time: 05/11/21  8:30 PM  Result Value Ref Range   Color, Urine AMBER (A) YELLOW    Comment: BIOCHEMICALS MAY BE AFFECTED BY COLOR   APPearance CLEAR CLEAR   Specific Gravity, Urine 1.009  1.005 - 1.030  pH 5.0 5.0 - 8.0   Glucose, UA NEGATIVE NEGATIVE mg/dL   Hgb urine dipstick MODERATE (A) NEGATIVE   Bilirubin Urine NEGATIVE NEGATIVE   Ketones, ur NEGATIVE NEGATIVE mg/dL   Protein, ur NEGATIVE NEGATIVE mg/dL   Nitrite NEGATIVE NEGATIVE   Leukocytes,Ua TRACE (A) NEGATIVE   RBC / HPF 0-5 0 - 5 RBC/hpf   WBC, UA 6-10 0 - 5 WBC/hpf   Bacteria, UA RARE (A) NONE SEEN   Squamous Epithelial / LPF 0-5 0 - 5   Mucus PRESENT     Comment: Performed at [facility], [place] [address], [place], [place] [address]  Protime-INR     Status: None   Collection Time: 05/12/21  3:43 AM  Result Value Ref Range   Prothrombin Time 12.1 11.4 - 15.2 seconds   INR 0.9 0.8 - 1.2    Comment: (NOTE) INR goal varies based on device and disease states. Performed at [facility], [place] [address] [place], [place] [address]   APTT     Status: None   Collection Time: 05/12/21  3:43 AM  Result Value Ref Range   aPTT 27 24 - 36 seconds    Comment: Performed at [facility] [address] [place], [place] [address]  Resp Panel by RT-PCR (Flu A&B, Covid) Nasopharyngeal Swab     Status: None   Collection Time: 05/12/21  4:37 AM   Specimen: Nasopharyngeal Swab; Nasopharyngeal(NP) swabs in vial transport medium  Result Value Ref Range   SARS Coronavirus 2 by RT PCR NEGATIVE NEGATIVE    Comment: (NOTE) SARS-CoV-2 target nucleic acids are NOT DETECTED.  The SARS-CoV-2 RNA is generally detectable in upper respiratory specimens during the acute phase of infection. The lowest concentration of SARS-CoV-2 viral copies this assay can detect is 138 copies/mL. A negative result does not preclude SARS-Cov-2 infection and should not be used as the sole basis for treatment or other patient management decisions. A negative result may occur with  improper specimen collection/handling, submission of specimen other than nasopharyngeal swab, presence of viral mutation(s) within the areas targeted  by this assay, and inadequate number of viral copies(<138 copies/mL). A negative result must be combined with clinical observations, patient history, and epidemiological information. The expected result is Negative.  Fact Sheet for Patients:  EntrepreneurPulse.com.au  Fact Sheet for Healthcare Providers:  IncredibleEmployment.be  This test is no t yet approved or cleared by the [place] FDA and  has been authorized for detection and/or diagnosis of SARS-CoV-2 by FDA under an Emergency Use Authorization (EUA). This EUA will remain  in effect (meaning this test can be used) for the duration of the COVID-19 declaration under Section 564(b)(1) of the Act, 21 U.S.C.section 360bbb-3(b)(1), unless the authorization is terminated  or revoked sooner.       Influenza A by PCR NEGATIVE NEGATIVE   Influenza B by PCR NEGATIVE NEGATIVE    Comment: (NOTE) The Xpert Xpress SARS-CoV-2/FLU/RSV plus assay is intended as an aid in the diagnosis of influenza from Nasopharyngeal swab specimens and should not be used as a sole basis for treatment. Nasal washings and aspirates are unacceptable for Xpert Xpress SARS-CoV-2/FLU/RSV testing.  Fact Sheet for Patients: EntrepreneurPulse.com.au  Fact Sheet for Healthcare Providers: IncredibleEmployment.be  This test is not yet approved or cleared by the [place] FDA and has been authorized for detection and/or diagnosis of SARS-CoV-2 by FDA under an Emergency Use Authorization (EUA). This EUA will remain in effect (meaning this  test can be used) for the duration of the COVID-19 declaration under Section 564(b)(1) of the Act, 21 U.S.C. section 360bbb-3(b)(1), unless the authorization is terminated or revoked.  Performed at [facility], [place] [address], [place], [place] [address]     US Abdomen Complete  Result Date: 05/11/2021 CLINICAL DATA:  Left upper quadrant  tenderness. EXAM: ABDOMEN ULTRASOUND COMPLETE COMPARISON:  CT 07/21/2020 FINDINGS: Gallbladder: Distended. No gallstones or wall thickening visualized. No sonographic Murphy sign noted by sonographer. Common bile duct: Diameter: 7 mm proximally, flares to 10 mm distally Liver: No focal lesion identified. Increased and heterogeneous in parenchymal echogenicity. No capsular nodularity. Portal vein is patent on color Doppler imaging with normal direction of blood flow towards the liver. IVC: No abnormality visualized. Pancreas: Dilated pancreatic duct at 10 mm. No pancreatic mass is seen. Spleen: Normal in size. No focal sonographic abnormality. Calcified granuloma on CT are not seen. Right Kidney: Length: 9.8 cm. Suggestion of mild hydronephrosis. No visualized renal calculi. Lower pole cyst on prior CT is not seen by ultrasound. Left Kidney: Length: 9.5 cm. No hydronephrosis. No visualized renal calculi. No focal renal abnormality. Abdominal aorta: No aneurysm visualized. Other findings: No abdominal ascites. IMPRESSION: 1. Dilatation of the common bile duct and pancreatic ducts. This can be seen in the setting of pancreatic head mass, although no focal pancreatic lesion is seen by ultrasound. Recommend further assessment with created protocol MRI, or contrast-enhanced abdominopelvic CT if patient cannot tolerate breath hold technique. 2. Mild right hydronephrosis, of unknown etiology. 3. Hepatic steatosis. Electronically Signed   By: Keith Rake M.D.   On: 05/11/2021 21:25   CT ABDOMEN PELVIS W CONTRAST  Result Date: 05/12/2021 CLINICAL DATA:  Abdominal pain. EXAM: CT ABDOMEN AND PELVIS WITH CONTRAST TECHNIQUE: Multidetector CT imaging of the abdomen and pelvis was performed using the standard protocol following bolus administration of intravenous contrast. RADIATION DOSE REDUCTION: This exam was performed according to the departmental dose-optimization program which includes automated exposure control,  adjustment of the mA and/or kV according to patient size and/or use of iterative reconstruction technique. CONTRAST:  32m OMNIPAQUE IOHEXOL 300 MG/ML  SOLN COMPARISON:  Jul 21, 2020 FINDINGS: Lower chest: No acute abnormality. Hepatobiliary: No focal liver abnormality is seen. Marked severity intrahepatic biliary dilatation is noted. The gallbladder is markedly distended without evidence of gallstones are gallbladder wall thickening. The common bile duct is dilated and measures approximately 13 mm in diameter. Abrupt narrowing of the common bile duct is seen at the level of the pancreatic head. (Axial CT images 28 through 31, CT series 3. This represents a new finding when compared to the prior study. Pancreas: Pancreatic duct is also dilated (12 mm in diameter) with abrupt narrowing seen within the pancreatic head (axial CT images 30 through 32, CT series 3). This represents a new finding when compared to the prior exam. Mild prominence of the pancreatic head is noted without an underlying mass lesion. No peripancreatic inflammatory fat stranding is seen. Spleen: Normal in size without focal abnormality. Adrenals/Urinary Tract: Adrenal glands are unremarkable. Kidneys are normal in size, without renal calculi or hydronephrosis. Small bilateral simple renal cysts are noted. Bladder is unremarkable. Stomach/Bowel: Stomach is within normal limits. Appendix appears normal. No evidence of bowel wall thickening, distention, or inflammatory changes. Noninflamed diverticula are seen throughout the large bowel. Vascular/Lymphatic: Aortic atherosclerosis. No enlarged abdominal or pelvic lymph nodes. Reproductive: Uterus and bilateral adnexa are unremarkable. Other: There is a 2.1 cm 1.0 cm fat  containing umbilical hernia. No abdominopelvic ascites. Musculoskeletal: Multilevel degenerative changes are noted throughout the lumbar spine. IMPRESSION: 1. Marked severity intrahepatic and extrahepatic biliary dilatation with an  abrupt narrowing of the common bile duct and pancreatic duct at the level of the pancreatic head. Further correlation with MRCP is recommended. 2. Markedly distended gallbladder without evidence of cholelithiasis or acute cholecystitis. 3. Colonic diverticulosis. 4. Small bilateral simple renal cysts. 5. 2.1 cm fat containing umbilical hernia. 6. Aortic atherosclerosis. Aortic Atherosclerosis (ICD10-I70.0). Electronically Signed   By: Virgina Norfolk M.D.   On: 05/12/2021 01:23   MR ABDOMEN MRCP W WO CONTAST  Result Date: 05/12/2021 CLINICAL DATA:  76 year old female with history of jaundice and abdominal pain. Biliary obstruction noted on recent CT examination. Follow-up study. EXAM: MRI ABDOMEN WITHOUT AND WITH CONTRAST (INCLUDING MRCP) TECHNIQUE: Multiplanar multisequence MR imaging of the abdomen was performed both before and after the administration of intravenous contrast. Heavily T2-weighted images of the biliary and pancreatic ducts were obtained, and three-dimensional MRCP images were rendered by post processing. CONTRAST:  71m GADAVIST GADOBUTROL 1 MMOL/ML IV SOLN COMPARISON:  No prior abdominal MRI. CT of the abdomen and pelvis 05/12/2021. FINDINGS: Comment: Portions of today's examination are limited by considerable patient respiratory motion. Lower chest: Unremarkable. Hepatobiliary: Within the limitations of today's examination, there are no definite suspicious cystic or solid hepatic lesions. Severe intra and extrahepatic biliary ductal dilatation is noted. Gallbladder is moderately distended. Gallbladder wall thickness is normal. No filling defects to suggest gallstones. No definite pericholecystic fluid. No filling defects within the common bile duct to suggest choledocholithiasis. Common bile duct measures up to 14 mm in the porta hepatis. There is abrupt cut off of the distal common bile duct in the region of the pancreatic head. Pancreas: MRCP images demonstrate severe diffuse pancreatic  ductal dilatation measuring up to 8 mm in the pancreatic body. In the pancreatic head there is a enhancing mass-like area which is poorly delineated on today's motion limited examination, but estimated to measure approximately 3.6 x 2.1 x 4.2 cm (axial image 55 of series 22 and coronal image 51 of series 31), concerning for pancreatic neoplasm. This causes abrupt cut off of the distal pancreatic duct, as well as the distal common bile duct. No peripancreatic fluid collections or inflammatory changes. Spleen:  Unremarkable. Adrenals/Urinary Tract: Multiple small T1 hypointense, T2 hyperintense, nonenhancing lesions in both kidneys are compatible with tiny simple cysts. No hydroureteronephrosis in the visualized portions of the abdomen. Bilateral adrenal glands are normal in appearance. Stomach/Bowel: Visualized portions are unremarkable. Vascular/Lymphatic: No aneurysm identified in the visualized abdominal vasculature. No lymphadenopathy noted in the abdomen Other: No significant volume of ascites noted in the visualized portions of the peritoneal cavity. Musculoskeletal: No aggressive appearing osseous lesions are noted in the visualized portions of the skeleton. IMPRESSION: 1. 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 Further evaluation with endoscopic ultrasound is strongly recommended at this time. Electronically Signed   By: DVinnie LangtonM.D.   On: 05/12/2021 07:05    Review of Systems  Constitutional: Negative.   HENT: Negative.    Eyes: Negative.   Respiratory: Negative.    Cardiovascular: Negative.   Gastrointestinal:  Negative for abdominal distention, abdominal pain, anal bleeding, blood in stool, constipation, diarrhea and nausea.  Genitourinary: Negative.   Musculoskeletal:  Positive for arthralgias.  Skin:  Positive for rash.  Neurological: Negative.    Hematological: Negative.   Psychiatric/Behavioral: Negative.    Blood pressure 139/77, pulse 70, temperature 97.8 F (36.6 C), temperature source Oral, resp. rate 16, SpO2 95 %. Physical Exam Constitutional:      General: She is not in acute distress.    Appearance: She is not ill-appearing, toxic-appearing or diaphoretic.  Eyes:     General: Scleral icterus present.  Neck:     Thyroid: No thyroid mass.     Trachea: Trachea normal.  Cardiovascular:     Rate and Rhythm: Normal rate and regular rhythm.  Pulmonary:     Effort: Pulmonary effort is normal.     Breath sounds: Normal breath sounds and air entry.  Chest:     Comments: Multiple erythematous lesions on the chest  Abdominal:     General: Abdomen is protuberant. Bowel sounds are normal.     Palpations: Abdomen is soft.     Tenderness: There is no abdominal tenderness.     Comments: Patchy rash on the abdomen and pelvis  Musculoskeletal:     Cervical back: Normal range of motion and neck supple.  Neurological:     General: No focal deficit present.     Mental Status: She is alert and oriented to person, place, and time.  Psychiatric:        Attention and Perception: Attention and perception normal.        Mood and Affect: Mood and affect normal.        Speech: Speech normal.        Behavior: Behavior normal.   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  It would be best to hold the next dose of Lovenox prior to the procedure. 2) Chronic diverticulosis. 3) Abnormally distended gallbladder no evidence of cholelithiasis or acute cholecystitis. 4) Umbilical hernia 5) Hypertension. 6) OSA on CPAP. 7) Skin rash with intense pruritus secondary to obstructive jaundice.  Juanita Craver 05/12/2021, 7:28 AM

## 2021-05-12 NOTE — ED Notes (Signed)
Pt ambulated to BR using cane and assist X 1.  ?

## 2021-05-12 NOTE — Assessment & Plan Note (Signed)
On admission patient was noted to have urinalysis with moderate hemoglobin, trace leukocytes, rare bacteria, and 6-10 WBCs. ?-Check urine culture ?

## 2021-05-12 NOTE — Assessment & Plan Note (Signed)
Patient reports using CPAP at night as prescribed. ?- Continue CPAP per RT at night ?

## 2021-05-12 NOTE — Assessment & Plan Note (Addendum)
Acute.  Initial glucose elevated up to 181.  Patient without prior history of diabetes.  ?-Check hemoglobin A1c ?

## 2021-05-12 NOTE — Assessment & Plan Note (Signed)
On admission blood pressures elevated up to 166/95.  Home blood pressure regimen includes olmesartan 40 mg daily and Cardizem 180 mg nightly. ?-Continue Cardizem and pharmacy substitution for olmesartan ?

## 2021-05-12 NOTE — Assessment & Plan Note (Signed)
Secondary to above 

## 2021-05-12 NOTE — ED Notes (Signed)
Hospitalist at bedside 

## 2021-05-12 NOTE — Anesthesia Preprocedure Evaluation (Addendum)
Anesthesia Evaluation  ?Patient identified by MRN, date of birth, ID band ?Patient awake ? ? ? ?Reviewed: ?Allergy & Precautions, NPO status , Patient's Chart, lab work & pertinent test results ? ?Airway ?Mallampati: II ? ?TM Distance: >3 FB ?Neck ROM: Full ? ? ? Dental ?no notable dental hx. ?(+) Teeth Intact, Dental Advisory Given ?  ?Pulmonary ?sleep apnea and Continuous Positive Airway Pressure Ventilation ,  ?  ?Pulmonary exam normal ?breath sounds clear to auscultation ? ? ? ? ? ? Cardiovascular ?hypertension, Pt. on medications ?Normal cardiovascular exam ?Rhythm:Regular Rate:Normal ? ? ?  ?Neuro/Psych ?negative neurological ROS ? negative psych ROS  ? GI/Hepatic ?negative GI ROS, Neg liver ROS,   ?Endo/Other  ?negative endocrine ROS ? Renal/GU ?Renal InsufficiencyRenal disease  ?negative genitourinary ?  ?Musculoskeletal ? ?(+) Arthritis ,  ? Abdominal ?  ?Peds ? Hematology ?negative hematology ROS ?(+)   ?Anesthesia Other Findings ? ? Reproductive/Obstetrics ? ?  ? ? ? ? ? ? ? ? ? ? ? ? ? ?  ?  ? ? ? ? ? ? ? ?Anesthesia Physical ?Anesthesia Plan ? ?ASA: 2 ? ?Anesthesia Plan: General  ? ?Post-op Pain Management:   ? ?Induction: Intravenous ? ?PONV Risk Score and Plan: 3 and Dexamethasone, Ondansetron and Treatment may vary due to age or medical condition ? ?Airway Management Planned: Oral ETT ? ?Additional Equipment:  ? ?Intra-op Plan:  ? ?Post-operative Plan: Extubation in OR ? ?Informed Consent: I have reviewed the patients History and Physical, chart, labs and discussed the procedure including the risks, benefits and alternatives for the proposed anesthesia with the patient or authorized representative who has indicated his/her understanding and acceptance.  ? ? ? ?Dental advisory given ? ?Plan Discussed with: CRNA ? ?Anesthesia Plan Comments:   ? ? ? ? ? ? ?Anesthesia Quick Evaluation ? ?

## 2021-05-12 NOTE — Assessment & Plan Note (Addendum)
Patient presented due to concerns of jaundice with severe itching.  Labs significant for alkaline phosphatase 402, AST 686, ALT 124.2, total bilirubin 8.7.  MRCP significant for a 3.6 x 2.1 x 4.2 cm enhancing lesion at the head of the pancreas obstructing the pancreatic and common bile duct concerning for neoplasm.  Dr. Benson Norway of GI have been consulted. ?-Admit to a telemetry bed ?-N.p.o. after midnight for need of procedure in a.m. ?-IV fentanyl as needed for pain ?-Colestyramine 400 mg twice daily for itching secondary to hyperbilirubinemia until possible stenting.  Unless GI has other recommendation. ?-Appreciate GI consultative services, will follow-up for further recommendations ?

## 2021-05-12 NOTE — Assessment & Plan Note (Signed)
On admission creatinine noted to be elevated at 1.21 with BUN 19.  No recent creatinine available at this time.  Records from care everywhere note creatinine was 0.8 over 11 years ago. ?-Gentle IV fluids at 75 mL/h ?-Recheck kidney function in a.m. ?

## 2021-05-12 NOTE — H&P (Addendum)
History and Physical    Patient: Tonya Jenkins LPF:[id number] DOB: [birth year] DOA: 05/11/2021 DOS: the patient was seen and examined on 05/12/2021 PCP: [name], MD  Patient coming from: Dermatology office  Chief Complaint:  Chief Complaint  Patient presents with   Jaundice   HPI: Tonya Jenkins is a 76 y.o. 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  Symptoms started approximately 6 days ago where she developed itching of her skin along with red bumpy rash.  Itching is severe with only mild relief with ointment cream she was given.  Associated symptoms include severe fatigue, intermittent abdominal pain, change in skin color, diarrhea, change in stool color (white stools ).  Denies any history of alcohol or drug abuse.  Denies any significant fever, weight loss, blood in stool, chest pain, or vomiting,.  Due to her symptoms she had initially gone to urgent care twice and subsequently followed up with dermatology for which she was noted to be jaundiced and told to come to the ER.  Upon admission to the emergency department patient was seen to be afebrile with blood pressures elevated up to 167/87, and all other vital signs relatively maintained.  Labs significant for hemoglobin 11.5, sodium 134, potassium 3.3, BUN 19, creatinine 1.21, glucose 181, alkaline phosphatase 402, AST 686, ALT 124.2, total bilirubin 8.7, and INR 0.9.  Urinalysis noted moderate hemoglobin trace leukocytes, rare bacteria, and 6-10 WBCs.  MRCP was able to be obtained which noted 3.6 x 2.1 x 4.2 cm enhancing lesion at the head of the pancreas obstructing the pancreatic and common bile duct concerning for neoplasm.  Patient had received Ativan,  Review of Systems: As mentioned in the history of present illness. All other systems reviewed  and are negative. Past Medical History:  Diagnosis Date   History of hip surgery    Hx of neck surgery    Hypertension    Past Surgical History:  Procedure Laterality Date   CERVICAL SPINE SURGERY     HAMMER TOE SURGERY     HIP SURGERY     KNEE SURGERY     Social History:  reports that she has never smoked. She has never used smokeless tobacco. She reports that she does not currently use alcohol. She reports that she does not currently use drugs.  Allergies  Allergen Reactions   Amlodipine Besylate     Other reaction(s): severe HA   Hydrochlorothiazide     Other reaction(s): hallucinations   Lexapro [Escitalopram Oxalate]     Pt stated, "It gave me hallucinations"   Oxycodone    Penicillins Other (See Comments)    No family history on file.  Prior to Admission medications   Medication Sig Start Date End Date Taking? Authorizing Provider  B Complex Vitamins (VITAMIN B COMPLEX PO) Take 1 capsule by mouth daily.   Yes [provider]  diltiazem (DILACOR XR) 180 MG 24 hr capsule Take 180 mg by mouth at bedtime. 04/27/21  Yes [provider]  melatonin 3 MG TABS tablet Take 3 mg by mouth at bedtime.   Yes [provider]  NON FORMULARY CPAP at bedtime   Yes [provider]  olmesartan (BENICAR) 40 MG tablet Take 40 mg by mouth at bedtime. 03/17/20  Yes [provider]  triamcinolone lotion (KENALOG) 0.1 % Apply 0.1  application. topically See admin instructions. Bid x 7 days 05/06/21  Yes [provider]  hydrOXYzine (ATARAX) 25 MG tablet Take 25 mg by mouth every 8 (eight) hours as needed for anxiety or itching. 05/06/21   [provider]    Physical Exam: Vitals:   05/12/21 0348 05/12/21 0700 05/12/21 0715 05/12/21 1000  BP: (!) 157/84 139/77 137/76 (!) 166/95  Pulse: 75 70 67 87  Resp: '17 16 14 19  '$ Temp: 97.8 F (36.6 C)     TempSrc: Oral     SpO2: 98% 95% 97% 98%   Constitutional: Elderly female who appears to  be no acute distress at this time Eyes: PERRL, lids and conjunctivae normal ENMT: Mucous membranes are moist. Posterior pharynx clear of any exudate or lesions.Normal dentition.  Neck: normal, supple, no masses, no thyromegaly Respiratory: clear to auscultation bilaterally, no wheezing, no crackles. Normal respiratory effort. No accessory muscle use.  Cardiovascular: Regular rate and rhythm, no murmurs / rubs / gallops. No extremity edema. 2+ pedal pulses. No carotid bruits.  Abdomen: no tenderness, no masses palpated. No hepatosplenomegaly. Bowel sounds positive.  Musculoskeletal: no clubbing / cyanosis. No joint deformity upper and lower extremities. Good ROM, no contractures. Normal muscle tone.  Skin: no rashes, lesions, ulcers. No induration Neurologic: CN 2-12 grossly intact. Sensation intact, DTR normal. Strength 5/5 in all 4.  Psychiatric: Normal judgment and insight. Alert and oriented x 3. Normal mood.   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  Labs significant for alkaline phosphatase 402, AST 686, ALT 124.2, total bilirubin 8.7.  MRCP significant for a 3.6 x 2.1 x 4.2 cm enhancing lesion at the head of the pancreas obstructing the pancreatic and common bile duct concerning for neoplasm.  Dr. Benson Norway of GI have been consulted. -Admit to a telemetry bed -N.p.o. after midnight for need of procedure in a.m. -IV fentanyl as needed for pain -Colestyramine 400 mg twice daily for itching secondary to hyperbilirubinemia until possible stenting.  Unless GI has other recommendation. -Appreciate GI consultative services, will follow-up for further recommendations  AKI (acute kidney injury) ([place]) On admission creatinine noted to be elevated at 1.21 with BUN 19.  No recent creatinine available at this time.  Records from care everywhere note creatinine was 0.8 over 11 years  ago. -Gentle IV fluids at 75 mL/h -Recheck kidney function in a.m.  Essential hypertension On admission blood pressures elevated up to 166/95.  Home blood pressure regimen includes olmesartan 40 mg daily and Cardizem 180 mg nightly. -Continue Cardizem and pharmacy substitution for olmesartan  Normocytic anemia On admission patient was noted to have hemoglobin 11.5 g/dL with elevated RDW of 17.2.  No recent hemoglobin to compare. -Add on iron studies -Recheck CBC in a.m.  OSA on CPAP Patient reports using CPAP at night as prescribed. - Continue CPAP per RT at night  Hyperglycemia Acute.  Initial glucose elevated up to 181.  Patient without prior history of diabetes.  -Check hemoglobin A1c  Elevated liver enzymes Secondary to above.  Hyperbilirubinemia Secondary to above.  Abnormal urinalysis On admission patient was noted to have urinalysis with moderate hemoglobin, trace leukocytes, rare bacteria, and 6-10 WBCs. -Check urine culture  Hypokalemia Acute.  Potassium 3.3 on admission. -Give 40 mEq of potassium chloride. -Continue to monitor and replace as    Advance Care Planning:   Code Status: Full Code   Consults:  GI  Family Communication: Patient's friend updated at bedside, but she declined need to update family at this time.  Severity of Illness: The appropriate patient status for this patient is INPATIENT. Inpatient status is judged to be reasonable and necessary in order to provide the required intensity of service to ensure the patient's safety. The patient's presenting symptoms, physical exam findings, and initial radiographic and laboratory data in the context of their chronic comorbidities is felt to place them at high risk for further clinical deterioration. Furthermore, it is not anticipated that the patient will be medically stable for discharge from the hospital within 2 midnights of admission.   * I certify that at the point of admission it is my clinical  judgment that the patient will require inpatient hospital care spanning beyond 2 midnights from the point of admission due to high intensity of service, high risk for further deterioration and high frequency of surveillance required.*  Author: Norval Morton, MD 05/12/2021 10:52 AM  For on call review [email or web address].

## 2021-05-12 NOTE — ED Provider Notes (Signed)
[facility] EMERGENCY DEPARTMENT Provider Note   CSN: [id number] Arrival date & time: 05/11/21  1846     History  Chief Complaint  Patient presents with   Jaundice    Tonya Jenkins is a 76 y.o. female.  The history is provided by the patient.  Rash Location:  Full body Severity:  Moderate Onset quality:  Gradual Timing:  Constant Progression:  Worsening Chronicity:  New Relieved by:  Nothing Worsened by:  Nothing Associated symptoms: diarrhea and fatigue   Associated symptoms: no fever   Patient with history of hypertension presents with jaundice.  Patient reports over the past 5 days she has had an itching rash.  She also has noted change in her skin color.  Patient was seen by dermatologist who recommended ER evaluation. Patient denies any alcohol abuse. She does not take Tylenol chronically. Denies any new medications that could have triggered this   Past Medical History:  Diagnosis Date   History of hip surgery    Hx of neck surgery    Hypertension     Home Medications Prior to Admission medications   Medication Sig Start Date End Date Taking? Authorizing Provider  B Complex Vitamins (VITAMIN B COMPLEX PO) Take 1 capsule by mouth daily.   Yes [provider]  diltiazem (DILACOR XR) 180 MG 24 hr capsule Take 180 mg by mouth at bedtime. 04/27/21  Yes [provider]  melatonin 3 MG TABS tablet Take 3 mg by mouth at bedtime.   Yes [provider]  NON FORMULARY CPAP at bedtime   Yes [provider]  olmesartan (BENICAR) 40 MG tablet Take 40 mg by mouth at bedtime. 03/17/20  Yes [provider]  triamcinolone lotion (KENALOG) 0.1 % Apply 0.1 application. topically See admin instructions. Bid x 7 days 05/06/21  Yes [provider]  hydrOXYzine (ATARAX) 25 MG tablet Take 25 mg by mouth every 8 (eight) hours as needed for anxiety or itching. 05/06/21   [provider]      Allergies     Amlodipine besylate, Hydrochlorothiazide, Lexapro [escitalopram oxalate], Oxycodone, and Penicillins    Review of Systems   Review of Systems  Constitutional:  Positive for fatigue. Negative for fever.  Gastrointestinal:  Positive for diarrhea.       Minimal abdominal pain  Skin:  Positive for rash.  All other systems reviewed and are negative.  Physical Exam Updated Vital Signs BP (!) 157/84 (BP Location: Right Arm)    Pulse 75    Temp 97.8 F (36.6 C) (Oral)    Resp 17    SpO2 98%  Physical Exam CONSTITUTIONAL: Elderly, no acute distress HEAD: Normocephalic/atraumatic EYES: EOMI/PERRL, icterus noted ENMT: Mucous membranes moist NECK: supple no meningeal signs SPINE/BACK:entire spine nontender CV: S1/S2 noted, no murmurs/rubs/gallops noted LUNGS: Lungs are clear to auscultation bilaterally, no apparent distress ABDOMEN: soft, nontender, no rebound or guarding, bowel sounds noted throughout abdomen GU:no cva tenderness NEURO: Pt is awake/alert/appropriate, moves all extremitiesx4.  No facial droop.   EXTREMITIES: pulses normal/equal, full ROM SKIN: warm, jaundice, excoriations noted PSYCH: no abnormalities of mood noted, alert and oriented to situation  ED Results / Procedures / Treatments   Labs (all labs ordered are listed, but only abnormal results are displayed) Labs Reviewed  COMPREHENSIVE METABOLIC PANEL - Abnormal; Notable for the following components:      Result Value   Sodium 134 (*)    Potassium 3.3 (*)    Glucose, Bld 181 (*)  Creatinine, Ser 1.21 (*)    AST 686 (*)    ALT 1,242 (*)    Alkaline Phosphatase 402 (*)    Total Bilirubin 8.7 (*)    GFR, Estimated 47 (*)    All other components within normal limits  CBC WITH DIFFERENTIAL/PLATELET - Abnormal; Notable for the following components:   Hemoglobin 11.5 (*)    HCT 34.7 (*)    RDW 17.2 (*)    All other components within normal limits  URINALYSIS, ROUTINE W REFLEX MICROSCOPIC - Abnormal; Notable  for the following components:   Color, Urine AMBER (*)    Hgb urine dipstick MODERATE (*)    Leukocytes,Ua TRACE (*)    Bacteria, UA RARE (*)    All other components within normal limits  RESP PANEL BY RT-PCR (FLU A&B, COVID) ARPGX2  LIPASE, BLOOD  PROTIME-INR  APTT  CBG MONITORING, ED    EKG None  Radiology US Abdomen Complete  Result Date: 05/11/2021 CLINICAL DATA:  Left upper quadrant tenderness. EXAM: ABDOMEN ULTRASOUND COMPLETE COMPARISON:  CT 07/21/2020 FINDINGS: Gallbladder: Distended. No gallstones or wall thickening visualized. No sonographic Murphy sign noted by sonographer. Common bile duct: Diameter: 7 mm proximally, flares to 10 mm distally Liver: No focal lesion identified. Increased and heterogeneous in parenchymal echogenicity. No capsular nodularity. Portal vein is patent on color Doppler imaging with normal direction of blood flow towards the liver. IVC: No abnormality visualized. Pancreas: Dilated pancreatic duct at 10 mm. No pancreatic mass is seen. Spleen: Normal in size. No focal sonographic abnormality. Calcified granuloma on CT are not seen. Right Kidney: Length: 9.8 cm. Suggestion of mild hydronephrosis. No visualized renal calculi. Lower pole cyst on prior CT is not seen by ultrasound. Left Kidney: Length: 9.5 cm. No hydronephrosis. No visualized renal calculi. No focal renal abnormality. Abdominal aorta: No aneurysm visualized. Other findings: No abdominal ascites. IMPRESSION: 1. Dilatation of the common bile duct and pancreatic ducts. This can be seen in the setting of pancreatic head mass, although no focal pancreatic lesion is seen by ultrasound. Recommend further assessment with created protocol MRI, or contrast-enhanced abdominopelvic CT if patient cannot tolerate breath hold technique. 2. Mild right hydronephrosis, of unknown etiology. 3. Hepatic steatosis. Electronically Signed   By: Keith Rake M.D.   On: 05/11/2021 21:25   CT ABDOMEN PELVIS W  CONTRAST  Result Date: 05/12/2021 CLINICAL DATA:  Abdominal pain. EXAM: CT ABDOMEN AND PELVIS WITH CONTRAST TECHNIQUE: Multidetector CT imaging of the abdomen and pelvis was performed using the standard protocol following bolus administration of intravenous contrast. RADIATION DOSE REDUCTION: This exam was performed according to the departmental dose-optimization program which includes automated exposure control, adjustment of the mA and/or kV according to patient size and/or use of iterative reconstruction technique. CONTRAST:  56m OMNIPAQUE IOHEXOL 300 MG/ML  SOLN COMPARISON:  Jul 21, 2020 FINDINGS: Lower chest: No acute abnormality. Hepatobiliary: No focal liver abnormality is seen. Marked severity intrahepatic biliary dilatation is noted. The gallbladder is markedly distended without evidence of gallstones are gallbladder wall thickening. The common bile duct is dilated and measures approximately 13 mm in diameter. Abrupt narrowing of the common bile duct is seen at the level of the pancreatic head. (Axial CT images 28 through 31, CT series 3. This represents a new finding when compared to the prior study. Pancreas: Pancreatic duct is also dilated (12 mm in diameter) with abrupt narrowing seen within the pancreatic head (axial CT images 30 through 32, CT series 3). This represents a new  finding when compared to the prior exam. Mild prominence of the pancreatic head is noted without an underlying mass lesion. No peripancreatic inflammatory fat stranding is seen. Spleen: Normal in size without focal abnormality. Adrenals/Urinary Tract: Adrenal glands are unremarkable. Kidneys are normal in size, without renal calculi or hydronephrosis. Small bilateral simple renal cysts are noted. Bladder is unremarkable. Stomach/Bowel: Stomach is within normal limits. Appendix appears normal. No evidence of bowel wall thickening, distention, or inflammatory changes. Noninflamed diverticula are seen throughout the large bowel.  Vascular/Lymphatic: Aortic atherosclerosis. No enlarged abdominal or pelvic lymph nodes. Reproductive: Uterus and bilateral adnexa are unremarkable. Other: There is a 2.1 cm 1.0 cm fat containing umbilical hernia. No abdominopelvic ascites. Musculoskeletal: Multilevel degenerative changes are noted throughout the lumbar spine. IMPRESSION: 1. Marked severity intrahepatic and extrahepatic biliary dilatation with an abrupt narrowing of the common bile duct and pancreatic duct at the level of the pancreatic head. Further correlation with MRCP is recommended. 2. Markedly distended gallbladder without evidence of cholelithiasis or acute cholecystitis. 3. Colonic diverticulosis. 4. Small bilateral simple renal cysts. 5. 2.1 cm fat containing umbilical hernia. 6. Aortic atherosclerosis. Aortic Atherosclerosis (ICD10-I70.0). Electronically Signed   By: Virgina Norfolk M.D.   On: 05/12/2021 01:23    Procedures Procedures    Medications Ordered in ED Medications  LORazepam (ATIVAN) injection 0.5 mg (has no administration in time range)  iohexol (OMNIPAQUE) 300 MG/ML solution 90 mL (90 mLs Intravenous Contrast Given 05/12/21 0107)    ED Course/ Medical Decision Making/ A&P Clinical Course as of 05/12/21 0524  Wed May 12, 2021  0515 Total Bilirubin(!): 8.7 Hyperbilirubinemia noted [DW]  0516 Glucose(!): 181 Hyperglycemia [DW]  0516 ALT(!): 1,242 Transaminitis [DW]  0523 Patient present with itching and jaundice.  CT imaging reveals biliary dilatation, will need MRCP. Patient without leukocytosis, no significant abdominal pain or gallstones.  Will defer antibiotics for now. [DW]  [phone] Discussed with hospitalist for admission.  Secure chat sent to Dr. Collene Mares and Dr. Benson Norway with GI [DW]    Clinical Course User Index [DW] Ripley Fraise, MD                           Medical Decision Making Amount and/or Complexity of Data Reviewed Labs:  Decision-making details documented in ED Course.  Risk Decision  regarding hospitalization.   This patient presents to the ED for concern of jaundice, this involves an extensive number of treatment options, and is a complaint that carries with it a high risk of complications and morbidity.  The differential diagnosis includes hepatitis, obstructive jaundice, pancreatitis, pancreatic mass, cholangitis  Comorbidities that complicate the patient evaluation: Patients presentation is complicated by their history of hypertension   Additional history obtained:  Records reviewed Primary Care Documents  Lab Tests: I Ordered, and personally interpreted labs.  The pertinent results include: Hyperbilirubinemia, transaminitis  Imaging Studies ordered: I ordered imaging studies including CT scan abdomen pelvis   I independently visualized and interpreted imaging which showed stent to gallbladder, biliary dilatation I agree with the radiologist interpretation  Cardiac Monitoring: The patient was maintained on a cardiac monitor.  I personally viewed and interpreted the cardiac monitor which showed an underlying rhythm of:  sinus rhythm   Consultations Obtained: I requested consultation with the admitting physician Dr. Normand Sloop , and discussed  findings as well as pertinent plan - they recommend: Admission  Reevaluation: After the interventions noted above, I reevaluated the patient and found that they have :  stayed the same  Complexity of problems addressed: Patients presentation is most consistent with  acute presentation with potential threat to life or bodily function  Disposition: After consideration of the diagnostic results and the patients response to treatment,  I feel that the patent would benefit from admission   .           Final Clinical Impression(s) / ED Diagnoses Final diagnoses:  Hyperbilirubinemia  Obstructive jaundice    Rx / DC Orders ED Discharge Orders     None         Ripley Fraise, MD 05/12/21 [phone]

## 2021-05-12 NOTE — Assessment & Plan Note (Addendum)
On admission patient was noted to have hemoglobin 11.5 g/dL with elevated RDW of 17.2.  No recent hemoglobin to compare. ?-Add on iron studies ?-Recheck CBC in a.m. ?

## 2021-05-12 NOTE — Assessment & Plan Note (Signed)
Acute.  Potassium 3.3 on admission. ?-Give 40 mEq of potassium chloride. ?-Continue to monitor and replace as ? ?

## 2021-05-12 NOTE — Consult Note (Addendum)
UNASSIGNED PATIENT Reason for Consult: Pancreatic head mass Referring Physician: [facility] hospital[facility]  Tonya Jenkins is an 76 y.o. female.  HPI: [name] is a 76 year old white female with a history of hypertension, OSA who was in her usual state of health till 5 days ago when she developed this intense pruritic rash all over her body. She presented to the emergency room due to the diffuse skin rash that she has had for the last 5 days with intense itching. On evaluation in the ER she was noted to have elevated liver enzymes with an alkaline phosphatase of 4 2 ALT of 1242 AST of 686 and total bili of 8.7 with INR of 0.9.  A CT scan done revealed marked severe intrahepatic and extrahepatic biliary dilatation with abrupt narrowing of the CBD and the PD at the level of the pancreatic head and MRI was recommended the gallbladder was markedly distended without evidence of cholelithiasis or acute cholecystitis and colonic diverticulosis with small bilateral renal cysts were noted along with a 2.1 cm fat-containing umbilical hernia and aortic atherosclerosis. A follow-up MRCP was done that revealed a 3.6 x 2.1 x 4.2 cm enhancing lesion in the head of the pancreas obstructing the pancreatic and the common bile duct concerning for a neoplasm.  Patient's had some abdominal discomfort but denies any weight loss melena or hematochezia.  She denies any nausea vomiting.  She is noted some clay colored stools and dark-colored urine.  She reportedly had a normal colonoscopy done 2 years ago while she lived in [place].  Past Medical History:  Diagnosis Date   History of hip surgery    Hx of neck surgery    Hypertension    Past Surgical History:  Procedure Laterality Date   CERVICAL SPINE SURGERY     HAMMER TOE SURGERY     HIP SURGERY     KNEE SURGERY     No family history on file.  Social History:  reports that she has never smoked. She has never used smokeless tobacco. She reports that  she does not currently use alcohol. She reports that she does not currently use drugs.  Allergies:  Allergies  Allergen Reactions   Amlodipine Besylate     Other reaction(s): severe HA   Hydrochlorothiazide     Other reaction(s): hallucinations   Lexapro [Escitalopram Oxalate]     Pt stated, "It gave me hallucinations"   Oxycodone    Penicillins Other (See Comments)    Medications: I have reviewed the patient's current medications. Prior to Admission:  Medications Prior to Admission  Medication Sig Dispense Refill Last Dose   B Complex Vitamins (VITAMIN B COMPLEX PO) Take 1 capsule by mouth daily.   05/10/2021   diltiazem (DILACOR XR) 180 MG 24 hr capsule Take 180 mg by mouth at bedtime.   05/10/2021   melatonin 3 MG TABS tablet Take 3 mg by mouth at bedtime.   Past Week   NON FORMULARY CPAP at bedtime      olmesartan (BENICAR) 40 MG tablet Take 40 mg by mouth at bedtime.   05/10/2021   triamcinolone lotion (KENALOG) 0.1 % Apply 0.1 application. topically See admin instructions. Bid x 7 days   05/11/2021   hydrOXYzine (ATARAX) 25 MG tablet Take 25 mg by mouth every 8 (eight) hours as needed for anxiety or itching.      Scheduled:  cholestyramine  4 g Oral BID   diltiazem  180 mg Oral QHS   enoxaparin (LOVENOX)  injection  40 mg Subcutaneous Q24H   irbesartan  300 mg Oral Daily   melatonin  3 mg Oral QHS   sodium chloride flush  3 mL Intravenous Q12H   Continuous:  sodium chloride 75 mL/hr at 05/12/21 1458    Results for orders placed or performed during the hospital encounter of 05/11/21 (from the past 48 hour(s))  Comprehensive metabolic panel     Status: Abnormal   Collection Time: 05/11/21  8:30 PM  Result Value Ref Range   Sodium 134 (L) 135 - 145 mmol/L   Potassium 3.3 (L) 3.5 - 5.1 mmol/L   Chloride 98 98 - 111 mmol/L   CO2 26 22 - 32 mmol/L   Glucose, Bld 181 (H) 70 - 99 mg/dL    Comment: Glucose reference range applies only to samples taken after fasting for at least 8  hours.   BUN 19 8 - 23 mg/dL   Creatinine, Ser 1.21 (H) 0.44 - 1.00 mg/dL   Calcium 9.4 8.9 - 10.3 mg/dL   Total Protein 7.1 6.5 - 8.1 g/dL   Albumin 3.6 3.5 - 5.0 g/dL   AST 686 (H) 15 - 41 U/L   ALT 1,242 (H) 0 - 44 U/L   Alkaline Phosphatase 402 (H) 38 - 126 U/L   Total Bilirubin 8.7 (H) 0.3 - 1.2 mg/dL   GFR, Estimated 47 (L) >60 mL/min    Comment: (NOTE) Calculated using the CKD-EPI Creatinine Equation (2021)    Anion gap 10 5 - 15    Comment: Performed at [facility], [place] [address] [place], [place] [address]  CBC with Differential     Status: Abnormal   Collection Time: 05/11/21  8:30 PM  Result Value Ref Range   WBC 6.6 4.0 - 10.5 K/uL   RBC 4.23 3.87 - 5.11 MIL/uL   Hemoglobin 11.5 (L) 12.0 - 15.0 g/dL   HCT 34.7 (L) 36.0 - 46.0 %   MCV 82.0 80.0 - 100.0 fL   MCH 27.2 26.0 - 34.0 pg   MCHC 33.1 30.0 - 36.0 g/dL   RDW 17.2 (H) 11.5 - 15.5 %   Platelets 307 150 - 400 K/uL   nRBC 0.0 0.0 - 0.2 %   Neutrophils Relative % 65 %   Neutro Abs 4.2 1.7 - 7.7 K/uL   Lymphocytes Relative 20 %   Lymphs Abs 1.3 0.7 - 4.0 K/uL   Monocytes Relative 9 %   Monocytes Absolute 0.6 0.1 - 1.0 K/uL   Eosinophils Relative 5 %   Eosinophils Absolute 0.4 0.0 - 0.5 K/uL   Basophils Relative 1 %   Basophils Absolute 0.1 0.0 - 0.1 K/uL   Immature Granulocytes 0 %   Abs Immature Granulocytes 0.02 0.00 - 0.07 K/uL    Comment: Performed at [facility] [address], [place], [place] [address]  Lipase, blood     Status: None   Collection Time: 05/11/21  8:30 PM  Result Value Ref Range   Lipase 39 11 - 51 U/L    Comment: Performed at [facility] [address] [place], [place] [address]  Urinalysis, Routine w reflex microscopic Urine, Clean Catch     Status: Abnormal   Collection Time: 05/11/21  8:30 PM  Result Value Ref Range   Color, Urine AMBER (A) YELLOW    Comment: BIOCHEMICALS MAY BE AFFECTED BY COLOR   APPearance CLEAR CLEAR   Specific Gravity, Urine 1.009  1.005 - 1.030  pH 5.0 5.0 - 8.0   Glucose, UA NEGATIVE NEGATIVE mg/dL   Hgb urine dipstick MODERATE (A) NEGATIVE   Bilirubin Urine NEGATIVE NEGATIVE   Ketones, ur NEGATIVE NEGATIVE mg/dL   Protein, ur NEGATIVE NEGATIVE mg/dL   Nitrite NEGATIVE NEGATIVE   Leukocytes,Ua TRACE (A) NEGATIVE   RBC / HPF 0-5 0 - 5 RBC/hpf   WBC, UA 6-10 0 - 5 WBC/hpf   Bacteria, UA RARE (A) NONE SEEN   Squamous Epithelial / LPF 0-5 0 - 5   Mucus PRESENT     Comment: Performed at [facility], [place] [address] [place], [place] [address]  Protime-INR     Status: None   Collection Time: 05/12/21  3:43 AM  Result Value Ref Range   Prothrombin Time 12.1 11.4 - 15.2 seconds   INR 0.9 0.8 - 1.2    Comment: (NOTE) INR goal varies based on device and disease states. Performed at [facility], [place] [address], [place], [place] [address]   APTT     Status: None   Collection Time: 05/12/21  3:43 AM  Result Value Ref Range   aPTT 27 24 - 36 seconds    Comment: Performed at [facility] [address], [place], [place] [address]  Resp Panel by RT-PCR (Flu A&B, Covid) Nasopharyngeal Swab     Status: None   Collection Time: 05/12/21  4:37 AM   Specimen: Nasopharyngeal Swab; Nasopharyngeal(NP) swabs in vial transport medium  Result Value Ref Range   SARS Coronavirus 2 by RT PCR NEGATIVE NEGATIVE    Comment: (NOTE) SARS-CoV-2 target nucleic acids are NOT DETECTED.  The SARS-CoV-2 RNA is generally detectable in upper respiratory specimens during the acute phase of infection. The lowest concentration of SARS-CoV-2 viral copies this assay can detect is 138 copies/mL. A negative result does not preclude SARS-Cov-2 infection and should not be used as the sole basis for treatment or other patient management decisions. A negative result may occur with  improper specimen collection/handling, submission of specimen other than nasopharyngeal swab, presence of viral mutation(s) within the areas targeted  by this assay, and inadequate number of viral copies(<138 copies/mL). A negative result must be combined with clinical observations, patient history, and epidemiological information. The expected result is Negative.  Fact Sheet for Patients:  EntrepreneurPulse.com.au  Fact Sheet for Healthcare Providers:  IncredibleEmployment.be  This test is no t yet approved or cleared by the [place] FDA and  has been authorized for detection and/or diagnosis of SARS-CoV-2 by FDA under an Emergency Use Authorization (EUA). This EUA will remain  in effect (meaning this test can be used) for the duration of the COVID-19 declaration under Section 564(b)(1) of the Act, 21 U.S.C.section 360bbb-3(b)(1), unless the authorization is terminated  or revoked sooner.       Influenza A by PCR NEGATIVE NEGATIVE   Influenza B by PCR NEGATIVE NEGATIVE    Comment: (NOTE) The Xpert Xpress SARS-CoV-2/FLU/RSV plus assay is intended as an aid in the diagnosis of influenza from Nasopharyngeal swab specimens and should not be used as a sole basis for treatment. Nasal washings and aspirates are unacceptable for Xpert Xpress SARS-CoV-2/FLU/RSV testing.  Fact Sheet for Patients: EntrepreneurPulse.com.au  Fact Sheet for Healthcare Providers: IncredibleEmployment.be  This test is not yet approved or cleared by the [place] FDA and has been authorized for detection and/or diagnosis of SARS-CoV-2 by FDA under an Emergency Use Authorization (EUA). This EUA will remain in effect (meaning this  test can be used) for the duration of the COVID-19 declaration under Section 564(b)(1) of the Act, 21 U.S.C. section 360bbb-3(b)(1), unless the authorization is terminated or revoked.  Performed at [facility], [place] [address] [place], [place] [address]     US Abdomen Complete  Result Date: 05/11/2021 CLINICAL DATA:  Left upper quadrant  tenderness. EXAM: ABDOMEN ULTRASOUND COMPLETE COMPARISON:  CT 07/21/2020 FINDINGS: Gallbladder: Distended. No gallstones or wall thickening visualized. No sonographic Murphy sign noted by sonographer. Common bile duct: Diameter: 7 mm proximally, flares to 10 mm distally Liver: No focal lesion identified. Increased and heterogeneous in parenchymal echogenicity. No capsular nodularity. Portal vein is patent on color Doppler imaging with normal direction of blood flow towards the liver. IVC: No abnormality visualized. Pancreas: Dilated pancreatic duct at 10 mm. No pancreatic mass is seen. Spleen: Normal in size. No focal sonographic abnormality. Calcified granuloma on CT are not seen. Right Kidney: Length: 9.8 cm. Suggestion of mild hydronephrosis. No visualized renal calculi. Lower pole cyst on prior CT is not seen by ultrasound. Left Kidney: Length: 9.5 cm. No hydronephrosis. No visualized renal calculi. No focal renal abnormality. Abdominal aorta: No aneurysm visualized. Other findings: No abdominal ascites. IMPRESSION: 1. Dilatation of the common bile duct and pancreatic ducts. This can be seen in the setting of pancreatic head mass, although no focal pancreatic lesion is seen by ultrasound. Recommend further assessment with created protocol MRI, or contrast-enhanced abdominopelvic CT if patient cannot tolerate breath hold technique. 2. Mild right hydronephrosis, of unknown etiology. 3. Hepatic steatosis. Electronically Signed   By: Keith Rake M.D.   On: 05/11/2021 21:25   CT ABDOMEN PELVIS W CONTRAST  Result Date: 05/12/2021 CLINICAL DATA:  Abdominal pain. EXAM: CT ABDOMEN AND PELVIS WITH CONTRAST TECHNIQUE: Multidetector CT imaging of the abdomen and pelvis was performed using the standard protocol following bolus administration of intravenous contrast. RADIATION DOSE REDUCTION: This exam was performed according to the departmental dose-optimization program which includes automated exposure control,  adjustment of the mA and/or kV according to patient size and/or use of iterative reconstruction technique. CONTRAST:  45m OMNIPAQUE IOHEXOL 300 MG/ML  SOLN COMPARISON:  Jul 21, 2020 FINDINGS: Lower chest: No acute abnormality. Hepatobiliary: No focal liver abnormality is seen. Marked severity intrahepatic biliary dilatation is noted. The gallbladder is markedly distended without evidence of gallstones are gallbladder wall thickening. The common bile duct is dilated and measures approximately 13 mm in diameter. Abrupt narrowing of the common bile duct is seen at the level of the pancreatic head. (Axial CT images 28 through 31, CT series 3. This represents a new finding when compared to the prior study. Pancreas: Pancreatic duct is also dilated (12 mm in diameter) with abrupt narrowing seen within the pancreatic head (axial CT images 30 through 32, CT series 3). This represents a new finding when compared to the prior exam. Mild prominence of the pancreatic head is noted without an underlying mass lesion. No peripancreatic inflammatory fat stranding is seen. Spleen: Normal in size without focal abnormality. Adrenals/Urinary Tract: Adrenal glands are unremarkable. Kidneys are normal in size, without renal calculi or hydronephrosis. Small bilateral simple renal cysts are noted. Bladder is unremarkable. Stomach/Bowel: Stomach is within normal limits. Appendix appears normal. No evidence of bowel wall thickening, distention, or inflammatory changes. Noninflamed diverticula are seen throughout the large bowel. Vascular/Lymphatic: Aortic atherosclerosis. No enlarged abdominal or pelvic lymph nodes. Reproductive: Uterus and bilateral adnexa are unremarkable. Other: There is a 2.1 cm 1.0 cm fat  containing umbilical hernia. No abdominopelvic ascites. Musculoskeletal: Multilevel degenerative changes are noted throughout the lumbar spine. IMPRESSION: 1. Marked severity intrahepatic and extrahepatic biliary dilatation with an  abrupt narrowing of the common bile duct and pancreatic duct at the level of the pancreatic head. Further correlation with MRCP is recommended. 2. Markedly distended gallbladder without evidence of cholelithiasis or acute cholecystitis. 3. Colonic diverticulosis. 4. Small bilateral simple renal cysts. 5. 2.1 cm fat containing umbilical hernia. 6. Aortic atherosclerosis. Aortic Atherosclerosis (ICD10-I70.0). Electronically Signed   By: Virgina Norfolk M.D.   On: 05/12/2021 01:23   MR ABDOMEN MRCP W WO CONTAST  Result Date: 05/12/2021 CLINICAL DATA:  76 year old female with history of jaundice and abdominal pain. Biliary obstruction noted on recent CT examination. Follow-up study. EXAM: MRI ABDOMEN WITHOUT AND WITH CONTRAST (INCLUDING MRCP) TECHNIQUE: Multiplanar multisequence MR imaging of the abdomen was performed both before and after the administration of intravenous contrast. Heavily T2-weighted images of the biliary and pancreatic ducts were obtained, and three-dimensional MRCP images were rendered by post processing. CONTRAST:  23m GADAVIST GADOBUTROL 1 MMOL/ML IV SOLN COMPARISON:  No prior abdominal MRI. CT of the abdomen and pelvis 05/12/2021. FINDINGS: Comment: Portions of today's examination are limited by considerable patient respiratory motion. Lower chest: Unremarkable. Hepatobiliary: Within the limitations of today's examination, there are no definite suspicious cystic or solid hepatic lesions. Severe intra and extrahepatic biliary ductal dilatation is noted. Gallbladder is moderately distended. Gallbladder wall thickness is normal. No filling defects to suggest gallstones. No definite pericholecystic fluid. No filling defects within the common bile duct to suggest choledocholithiasis. Common bile duct measures up to 14 mm in the porta hepatis. There is abrupt cut off of the distal common bile duct in the region of the pancreatic head. Pancreas: MRCP images demonstrate severe diffuse pancreatic  ductal dilatation measuring up to 8 mm in the pancreatic body. In the pancreatic head there is a enhancing mass-like area which is poorly delineated on today's motion limited examination, but estimated to measure approximately 3.6 x 2.1 x 4.2 cm (axial image 55 of series 22 and coronal image 51 of series 31), concerning for pancreatic neoplasm. This causes abrupt cut off of the distal pancreatic duct, as well as the distal common bile duct. No peripancreatic fluid collections or inflammatory changes. Spleen:  Unremarkable. Adrenals/Urinary Tract: Multiple small T1 hypointense, T2 hyperintense, nonenhancing lesions in both kidneys are compatible with tiny simple cysts. No hydroureteronephrosis in the visualized portions of the abdomen. Bilateral adrenal glands are normal in appearance. Stomach/Bowel: Visualized portions are unremarkable. Vascular/Lymphatic: No aneurysm identified in the visualized abdominal vasculature. No lymphadenopathy noted in the abdomen Other: No significant volume of ascites noted in the visualized portions of the peritoneal cavity. Musculoskeletal: No aggressive appearing osseous lesions are noted in the visualized portions of the skeleton. IMPRESSION: 1. 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 Further evaluation with endoscopic ultrasound is strongly recommended at this time. Electronically Signed   By: DVinnie LangtonM.D.   On: 05/12/2021 07:05    Review of Systems  Constitutional: Negative.   HENT: Negative.    Eyes: Negative.   Respiratory: Negative.    Cardiovascular: Negative.   Gastrointestinal:  Negative for abdominal distention, abdominal pain, anal bleeding, blood in stool, constipation, diarrhea and nausea.  Genitourinary: Negative.   Musculoskeletal:  Positive for arthralgias.  Skin:  Positive for rash.  Neurological: Negative.    Hematological: Negative.   Psychiatric/Behavioral: Negative.    Blood pressure 139/77, pulse 70, temperature 97.8 F (36.6 C), temperature source Oral, resp. rate 16, SpO2 95 %. Physical Exam Constitutional:      General: She is not in acute distress.    Appearance: She is not ill-appearing, toxic-appearing or diaphoretic.  Eyes:     General: Scleral icterus present.  Neck:     Thyroid: No thyroid mass.     Trachea: Trachea normal.  Cardiovascular:     Rate and Rhythm: Normal rate and regular rhythm.  Pulmonary:     Effort: Pulmonary effort is normal.     Breath sounds: Normal breath sounds and air entry.  Chest:     Comments: Multiple erythematous lesions on the chest  Abdominal:     General: Abdomen is protuberant. Bowel sounds are normal.     Palpations: Abdomen is soft.     Tenderness: There is no abdominal tenderness.     Comments: Patchy rash on the abdomen and pelvis  Musculoskeletal:     Cervical back: Normal range of motion and neck supple.  Neurological:     General: No focal deficit present.     Mental Status: She is alert and oriented to person, place, and time.  Psychiatric:        Attention and Perception: Attention and perception normal.        Mood and Affect: Mood and affect normal.        Speech: Speech normal.        Behavior: Behavior normal.   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  It would be best to hold the next dose of Lovenox prior to the procedure. 2) Chronic diverticulosis. 3) Abnormally distended gallbladder no evidence of cholelithiasis or acute cholecystitis. 4) Umbilical hernia 5) Hypertension. 6) OSA on CPAP. 7) Skin rash with intense pruritus secondary to obstructive jaundice.  Juanita Craver 05/12/2021, 7:28 AM

## 2021-05-12 NOTE — ED Notes (Signed)
MRS, Briseno WANTED HER IV REMOVE OUT ?

## 2021-05-13 ENCOUNTER — Encounter (HOSPITAL_COMMUNITY): Payer: Self-pay | Admitting: Family Medicine

## 2021-05-13 ENCOUNTER — Inpatient Hospital Stay (HOSPITAL_COMMUNITY): Payer: Medicare Other | Admitting: Certified Registered Nurse Anesthetist

## 2021-05-13 ENCOUNTER — Inpatient Hospital Stay (HOSPITAL_COMMUNITY): Payer: Medicare Other

## 2021-05-13 ENCOUNTER — Encounter (HOSPITAL_COMMUNITY)
Admission: EM | Disposition: A | Discharge status: Home Health | Payer: Self-pay | Source: Home / Self Care | Attending: Internal Medicine

## 2021-05-13 DIAGNOSIS — R748 Abnormal levels of other serum enzymes: Secondary | ICD-10-CM | POA: Diagnosis not present

## 2021-05-13 DIAGNOSIS — K831 Obstruction of bile duct: Secondary | ICD-10-CM | POA: Diagnosis not present

## 2021-05-13 DIAGNOSIS — I10 Essential (primary) hypertension: Secondary | ICD-10-CM | POA: Diagnosis not present

## 2021-05-13 DIAGNOSIS — N179 Acute kidney failure, unspecified: Secondary | ICD-10-CM | POA: Diagnosis not present

## 2021-05-13 HISTORY — PX: IR BILIARY DRAIN PLACEMENT WITH CHOLANGIOGRAM: IMG6043

## 2021-05-13 HISTORY — PX: IR INT EXT BILIARY DRAIN WITH CHOLANGIOGRAM: IMG6044

## 2021-05-13 HISTORY — PX: ERCP: SHX5425

## 2021-05-13 LAB — COMPREHENSIVE METABOLIC PANEL
ALT: 856 U/L — ABNORMAL HIGH (ref 0–44)
AST: 392 U/L — ABNORMAL HIGH (ref 15–41)
Albumin: 2.9 g/dL — ABNORMAL LOW (ref 3.5–5.0)
Alkaline Phosphatase: 336 U/L — ABNORMAL HIGH (ref 38–126)
Anion gap: 10 (ref 5–15)
BUN: 16 mg/dL (ref 8–23)
CO2: 21 mmol/L — ABNORMAL LOW (ref 22–32)
Calcium: 8.7 mg/dL — ABNORMAL LOW (ref 8.9–10.3)
Chloride: 106 mmol/L (ref 98–111)
Creatinine, Ser: 1.14 mg/dL — ABNORMAL HIGH (ref 0.44–1.00)
GFR, Estimated: 50 mL/min — ABNORMAL LOW (ref >60)
Glucose, Bld: 147 mg/dL — ABNORMAL HIGH (ref 70–99)
Potassium: 3.6 mmol/L (ref 3.5–5.1)
Sodium: 137 mmol/L (ref 135–145)
Total Bilirubin: 8.2 mg/dL — ABNORMAL HIGH (ref 0.3–1.2)
Total Protein: 5.7 g/dL — ABNORMAL LOW (ref 6.5–8.1)

## 2021-05-13 LAB — CBC
HCT: 29.5 % — ABNORMAL LOW (ref 36.0–46.0)
Hemoglobin: 10.3 g/dL — ABNORMAL LOW (ref 12.0–15.0)
MCH: 28.1 pg (ref 26.0–34.0)
MCHC: 34.9 g/dL (ref 30.0–36.0)
MCV: 80.4 fL (ref 80.0–100.0)
Platelets: 254 10*3/uL (ref 150–400)
RBC: 3.67 MIL/uL — ABNORMAL LOW (ref 3.87–5.11)
RDW: 17.5 % — ABNORMAL HIGH (ref 11.5–15.5)
WBC: 6.3 10*3/uL (ref 4.0–10.5)
nRBC: 0 % (ref 0.0–0.2)

## 2021-05-13 LAB — GLUCOSE, CAPILLARY
Glucose-Capillary: 153 mg/dL — ABNORMAL HIGH (ref 70–99)
Glucose-Capillary: 190 mg/dL — ABNORMAL HIGH (ref 70–99)
Glucose-Capillary: 191 mg/dL — ABNORMAL HIGH (ref 70–99)

## 2021-05-13 LAB — URINE CULTURE

## 2021-05-13 IMAGING — RF DG ERCP WO/W SPHINCTEROTOMY
1 series · 2 of 2 positions shown · non-contrast
Comparison: CT AP, [DATE] and [DATE].

CLINICAL DATA: Biliary obstruction.

EXAM:
ERCP

[Series 1: unknown protocol · 0.20mm/px · 2 of 2 slices shown]
[im 1/2]
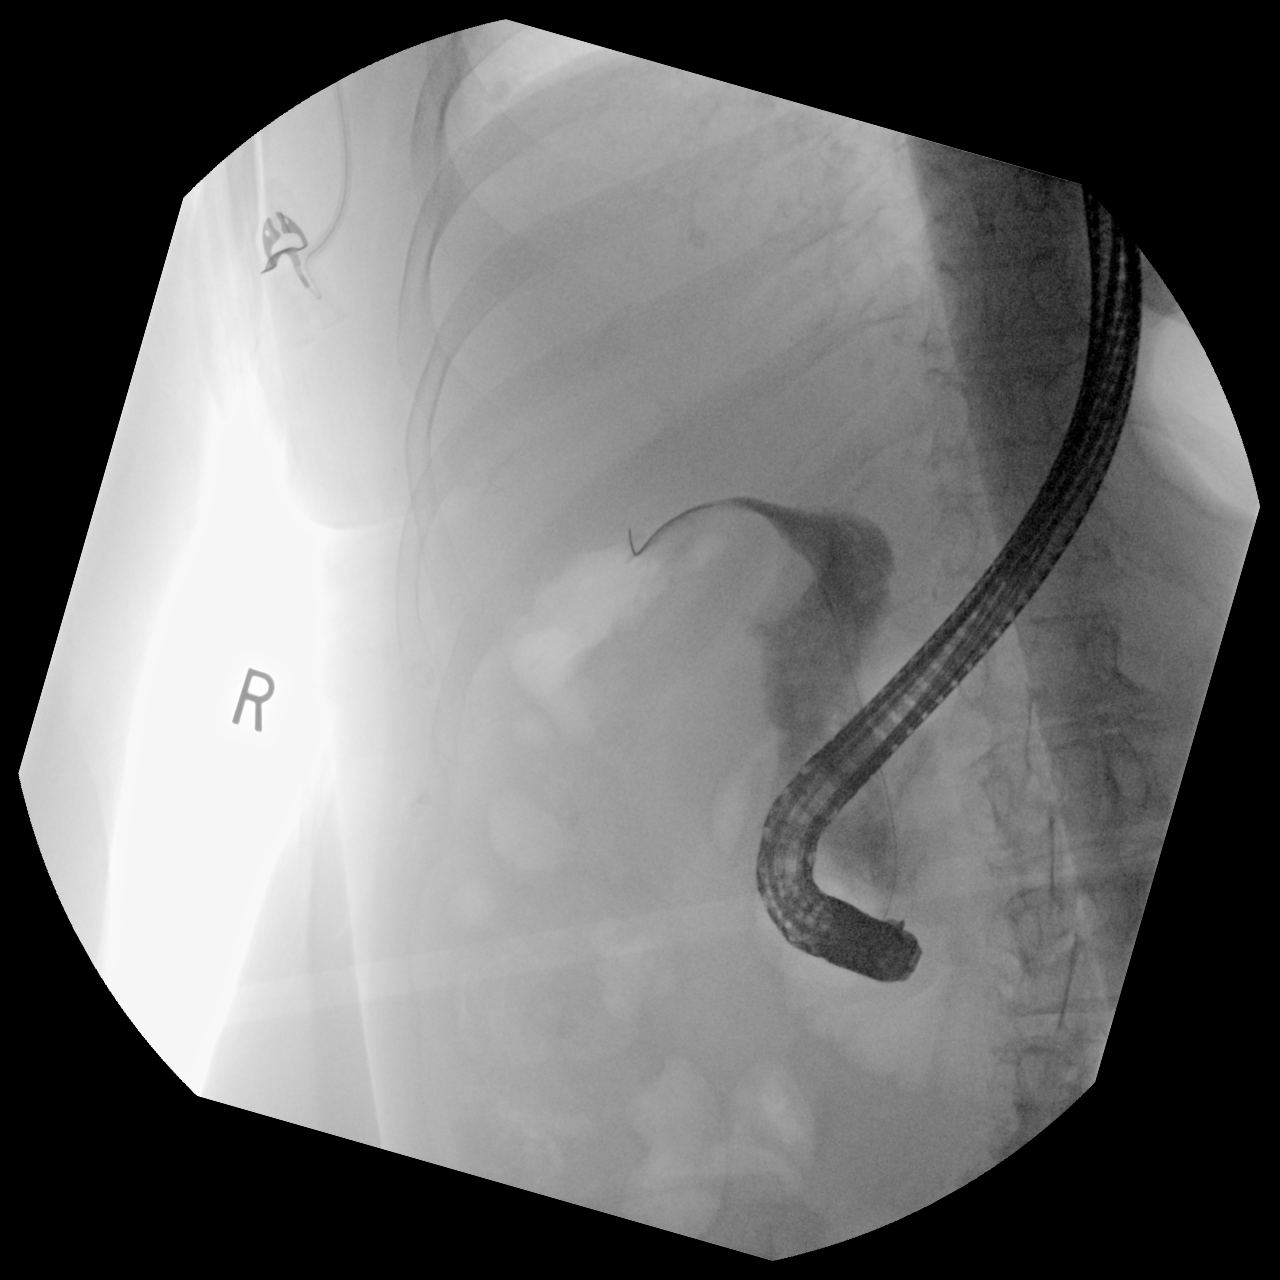
[im 2/2]
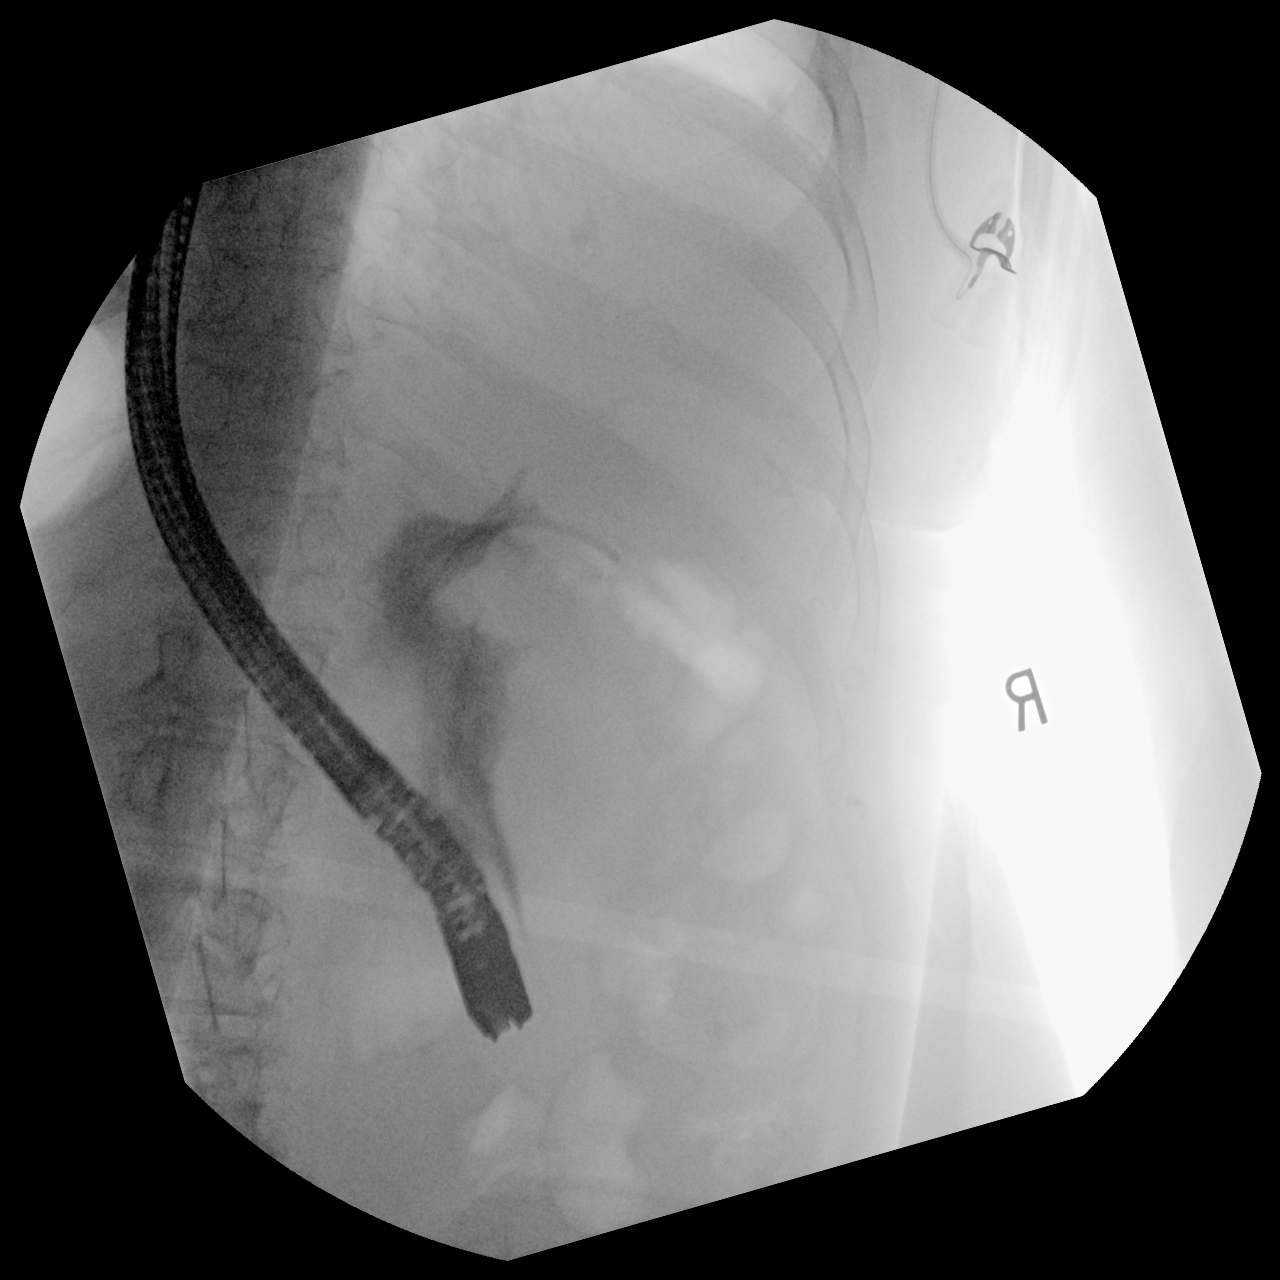

[2 of 2 positions shown; findings below may reference images not displayed]

MRCP, [DATE].

FLUOROSCOPY:
Exposure Index (as provided by the fluoroscopic device): 37 mGy
Kerma
FINDINGS: Limited oblique planar images of the RIGHT upper quadrant obtained
C-arm.

Images demonstrating flexible endoscopy, attempted biliary duct
cannulation and retrograde cholangiogram.
IMPRESSION: Fluoroscopic imaging for ERCP. No discrete retrograde biliary
opacification

For complete description of intra procedural findings, please see
performing service dictation.

## 2021-05-13 IMAGING — XA IR BILIARY DRAIN PLACEMENT W/ CHOLANGIOGRAM
10 of 11 series · 14 of 16 positions shown · non-contrast
Comparison: ERCP, earlier same day.

INDICATION: Pancreatic mass.  Malignant biliary obstruction.

EXAM:
Procedures:
1. ANTEROGRADE CHOLANGIOGRAM
2. PERCUTANEOUS TRANSHEPATIC BILIARY DRAINAGE CATHETER PLACEMENT VIA
CHOLECYSTOSTOMY ACCESS
Performing Physician: PINA
PINA qualified trainee/resident or advanced practice provider (APP) was
not immediately available to assist with this case.
TECHNIQUE: Informed written consent was obtained from the patient after a
discussion of the risks, benefits and alternatives to treatment.
Questions regarding the procedure were encouraged and answered. A
timeout was performed prior to the initiation of the procedure.

[Series 1: fl (-) angio · 1 of 1 slices shown (1 of 6)]
[im 1/1]
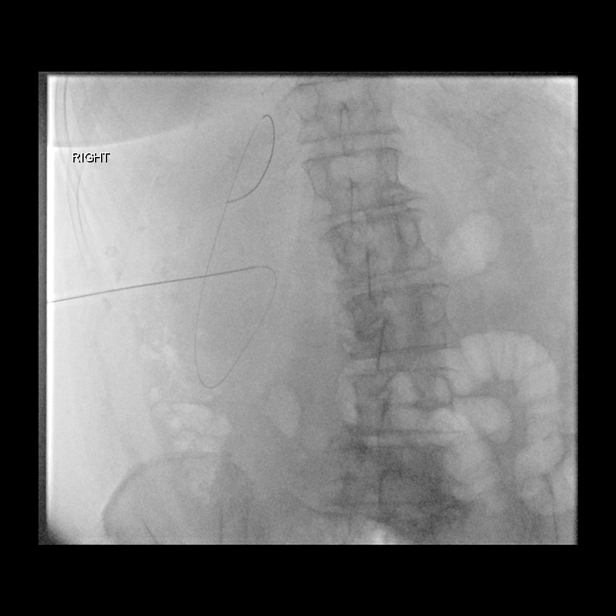

[Series 2: ir biliary drain placement w/ cholangiogram · 4 of 5 slices shown]
[im 1/5]
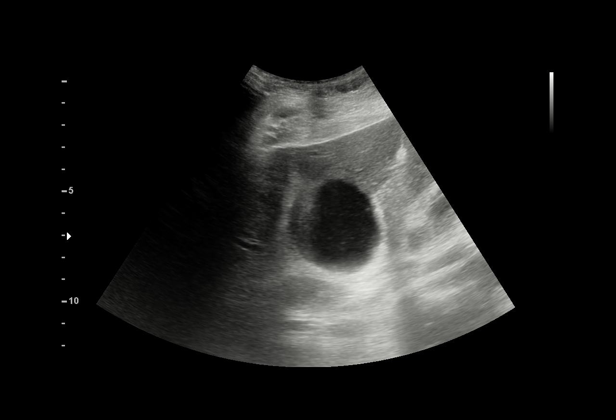
[im 2/5]
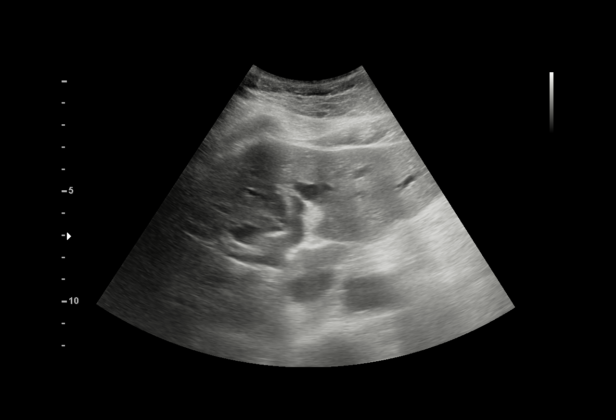
[im 4/5]
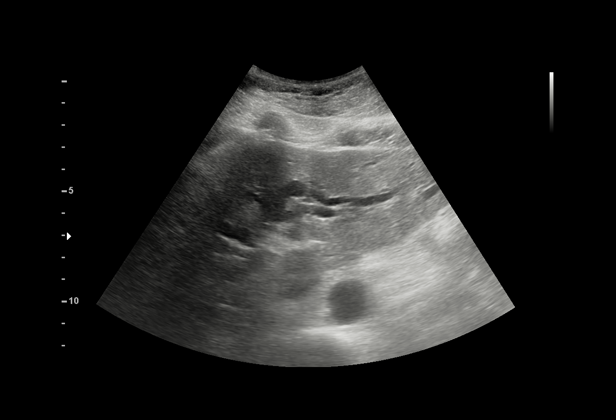
[im 5/5]
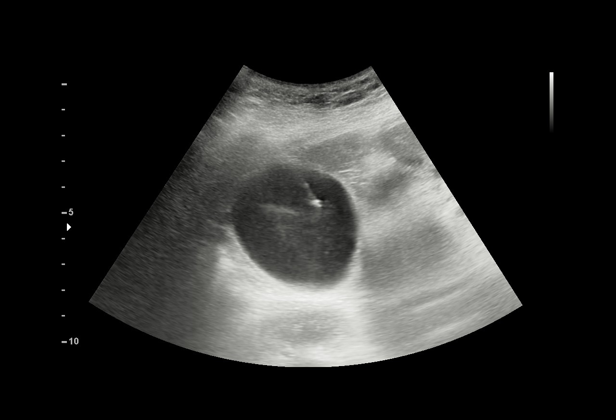

[Series 2: fl (-) angio · 1 of 1 slices shown (2 of 6)]
[im 1/1]
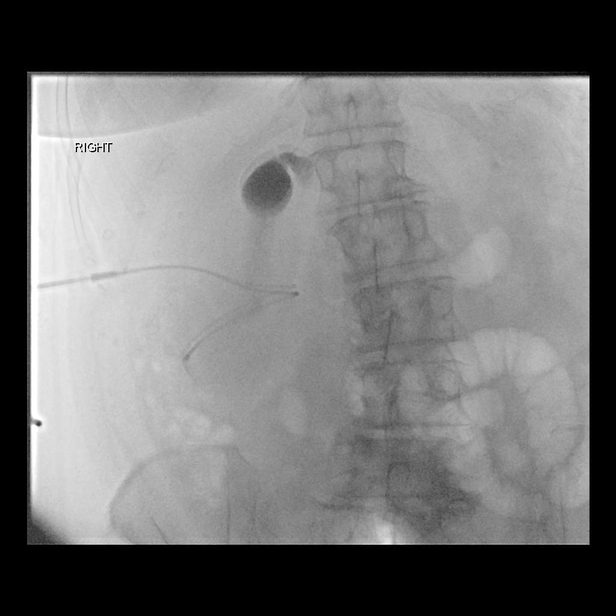

[Series 3: fl (-) angio · 1 of 1 slices shown (3 of 6)]
[im 1/1]
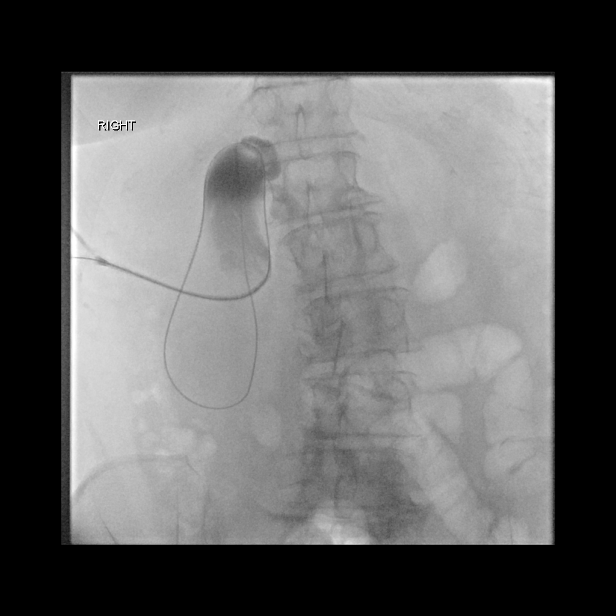

[Series 4: fl (-) angio · 1 of 1 slices shown (4 of 6)]
[im 1/1]
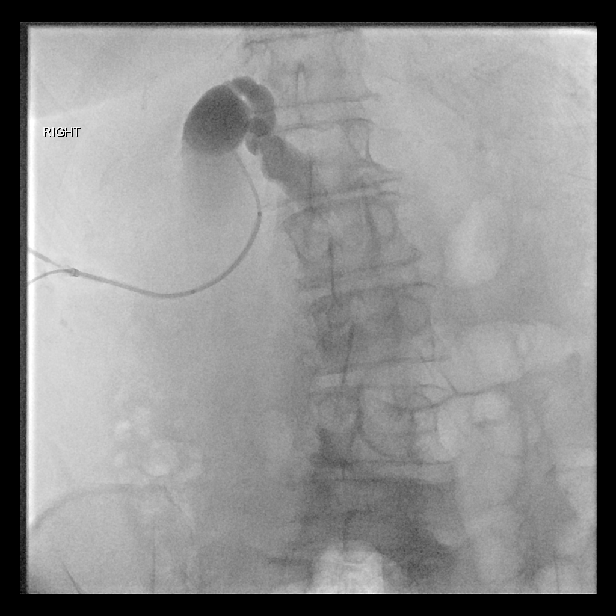

[Series 5: fl (-) angio · 1 of 1 slices shown (5 of 6)]
[im 1/1]
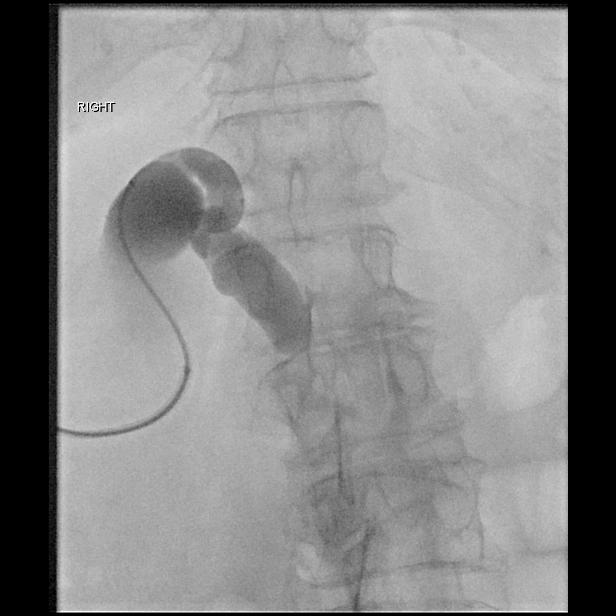

[Series 6: single care · 1 of 1 slices shown (1 of 3)]
[im 1/1]
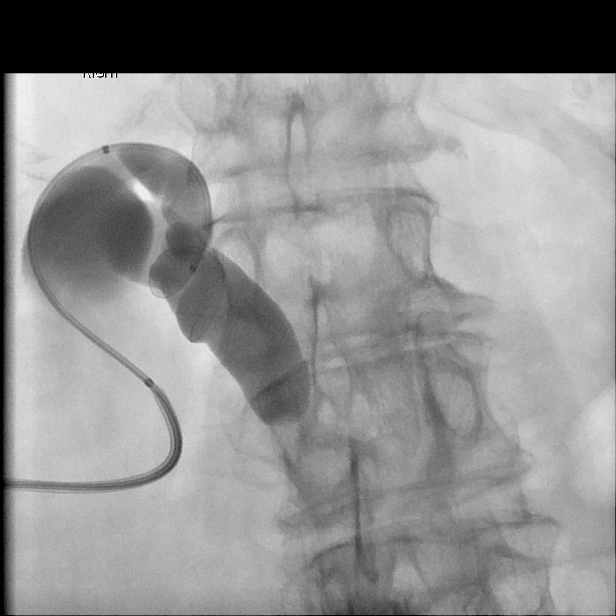

[Series 8: single care · 1 of 1 slices shown (2 of 3)]
[im 1/1]
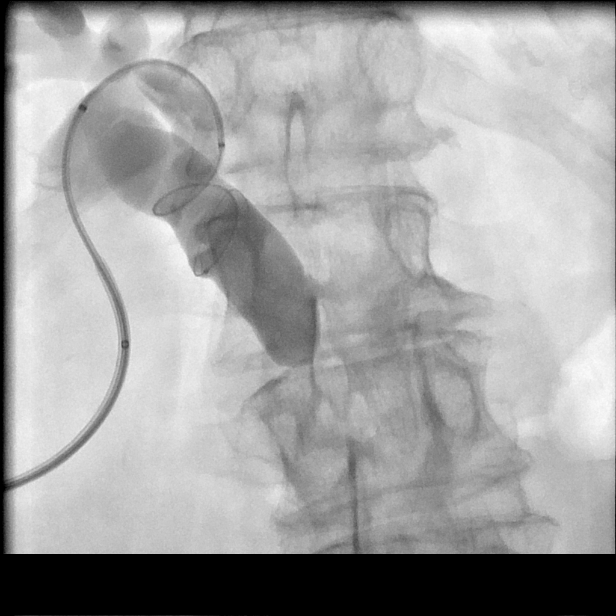

[Series 9: single care · 1 of 1 slices shown (3 of 3)]
[im 1/1]
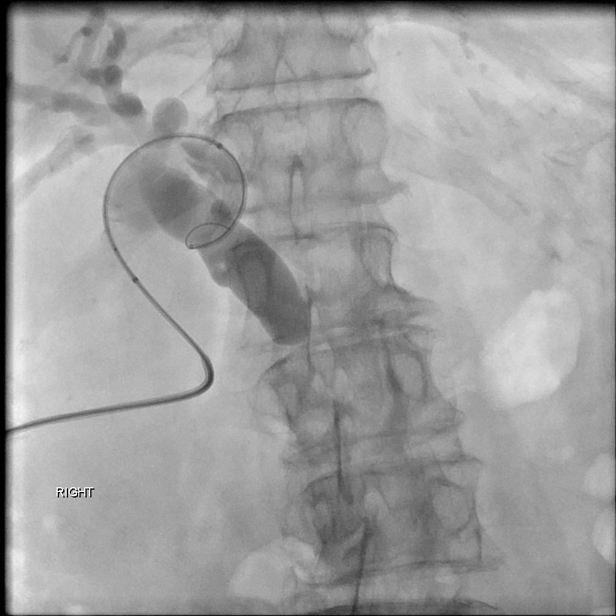

[Series 11: fl (-) angio · 2 of 2 slices shown (6 of 6)]
[im 1/2]
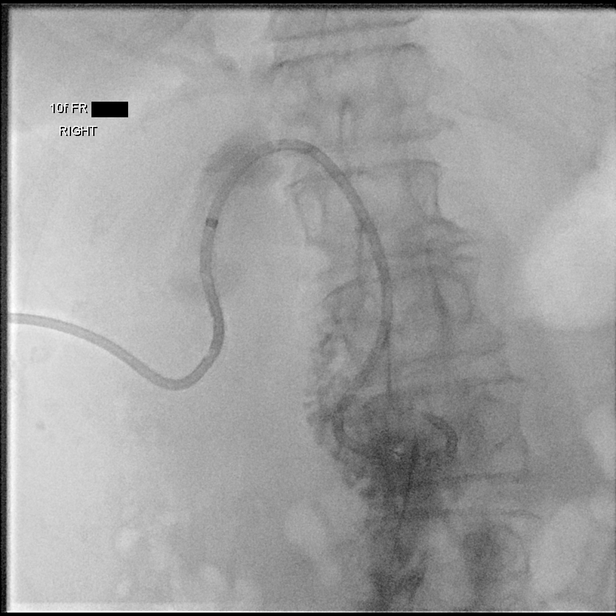
[im 2/2]
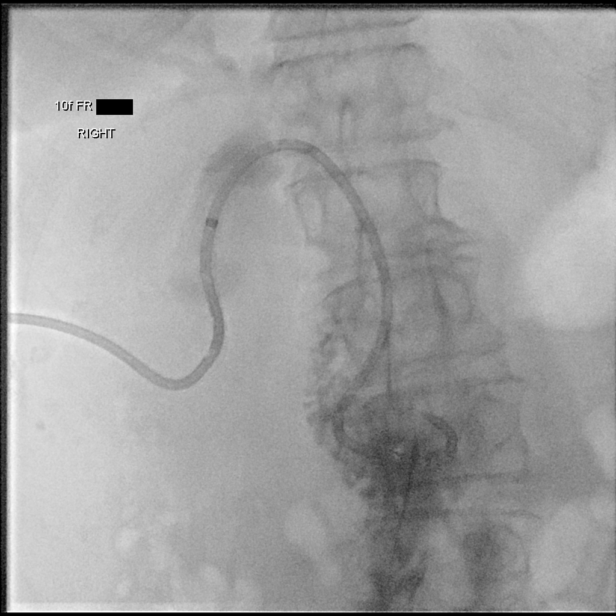

[14 of 16 positions shown; findings below may reference images not displayed]

MRI abdomen and CT AP,
[DATE]. US Abdomen, [DATE].

MEDICATIONS:
The patient was on scheduled IV antibiotics, including ciprofloxacin
and Flagyl.

CONTRAST:  10mL OMNIPAQUE IOHEXOL 300 MG/ML SOLN - administered into
the biliary tree.

ANESTHESIA/SEDATION:
Moderate (conscious) sedation was employed during this procedure. A
total of Versed 4 mg and Fentanyl 200 mcg was administered
intravenously.

Moderate Sedation Time: 91 minutes. The patient's level of
consciousness and vital signs were monitored continuously by
radiology nursing throughout the procedure under my direct
supervision.

FLUOROSCOPY TIME:  32 minutes, 6 seconds. Fluoroscopic dose; 177
mGy.

COMPLICATIONS:
None immediate.
The right upper abdominal quadrant was prepped and draped in the
usual sterile fashion, and a sterile drape was applied covering the
operative field. Maximum barrier sterile technique with sterile
gowns and gloves were used for the procedure. A timeout was
performed prior to the initiation of the procedure.

Ultrasound scanning of the right upper quadrant demonstrates a
markedly dilated gallbladder. Utilizing a transhepatic approach, a
22 gauge needle was advanced into the gallbladder under direct
ultrasound guidance. An ultrasound image was saved for documentation
purposes. Appropriate intraluminal puncture was confirmed with the
efflux of bile and advancement of an 0.018 wire into the gallbladder
lumen. The needle was exchanged for an Accustick set. A small amount
of contrast was injected to confirm appropriate intraluminal
positioning.

Over a Benson wire, a 5 Fr, 35 cm Brite tip sheath was advanced into
the gallbladder and positioned at the gallbladder neck/cystic duct.
Using a 0.018 inch V18 Glidewire and 90 cm Navicross catheter access
through the cystic duct into the common bile duct, then finally past
the obstruction and into the duodenum was obtained. Transpapillary
anchoring with an 0.035 inch Amplatz wire was placed.

Over the wire, a 10 Fr internal/external biliary catheter was
advanced over wire, across the obstruction, with coil ultimately
locked within the duodenum. Contrast was injected and a completion
radiographs were obtained.

The catheter was connected to a drainage bag which yielded the brisk
return of bile. The catheter was secured to the skin with an
interrupted suture and StatLock device. Dressings were applied. The
patient tolerated the procedure well without immediate
postprocedural complication.
FINDINGS: Sonographic evaluation of the liver demonstrates marked intrahepatic
biliary ductal dilatation as was demonstrated on preceding abdominal
CT.

Under direct ultrasound guidance, a dilated gallbladder was accessed
via transhepatic approach then cannulation across the cystic duct
was performed, allowing for placement of a 10 French
internalized/externalized biliary drainage catheter with end
ultimately coiled and locked within the duodenum. Transpapillary
access into duodenum after considerable effort.

Antegrade cholangiogram demonstrates marked dilatation of the CBD
and intrahepatic biliary tree with communication between the right
and left biliary trees at the level of the hilum.Distal obstruction
resulting in flush occlusion on cholangiogram.
IMPRESSION: Successful placement of a 10 Fr percutaneous
internalized/externalized biliary drainage catheter via
cholecystostomy access, with catheter end coiled within the
duodenum.

PLAN:
1. Flush tube w 10 mL sterile NS and record drain output qShift.
2. Follow up for routine tube exchange in 2 month(s).

## 2021-05-13 SURGERY — ERCP, WITH INTERVENTION IF INDICATED
Anesthesia: General

## 2021-05-13 MED ORDER — SODIUM CHLORIDE 0.9 % IV SOLN
INTRAVENOUS | Status: DC | PRN
  Administered 2021-05-13: 09:00:00 20 mL

## 2021-05-13 MED ORDER — IOHEXOL 300 MG/ML  SOLN
100.0000 mL | Freq: Once | INTRAMUSCULAR | Status: AC | PRN
  Administered 2021-05-13: 17:00:00 10 mL

## 2021-05-13 MED ORDER — FENTANYL CITRATE (PF) 100 MCG/2ML IJ SOLN
INTRAMUSCULAR | Status: AC
  Filled 2021-05-13: qty 4

## 2021-05-13 MED ORDER — LIDOCAINE 2% (20 MG/ML) 5 ML SYRINGE
INTRAMUSCULAR | Status: DC | PRN
  Administered 2021-05-13: 50 mg via INTRAVENOUS

## 2021-05-13 MED ORDER — FENTANYL CITRATE (PF) 100 MCG/2ML IJ SOLN
INTRAMUSCULAR | Status: DC | PRN
  Administered 2021-05-13 (×8): 25 ug via INTRAVENOUS

## 2021-05-13 MED ORDER — LIDOCAINE HCL 1 % IJ SOLN
INTRAMUSCULAR | Status: AC
Start: 2021-05-13 — End: 2021-05-14
  Filled 2021-05-13: qty 20

## 2021-05-13 MED ORDER — CIPROFLOXACIN IN D5W 400 MG/200ML IV SOLN
400.0000 mg | Freq: Two times a day (BID) | INTRAVENOUS | Status: DC
  Administered 2021-05-13 – 2021-05-17 (×7): 400 mg via INTRAVENOUS
  Filled 2021-05-13 (×10): qty 200

## 2021-05-13 MED ORDER — METRONIDAZOLE 500 MG/100ML IV SOLN
500.0000 mg | Freq: Three times a day (TID) | INTRAVENOUS | Status: DC
Start: 2021-05-13 — End: 2021-05-17
  Administered 2021-05-13 – 2021-05-17 (×12): 500 mg via INTRAVENOUS
  Filled 2021-05-13 (×12): qty 100

## 2021-05-13 MED ORDER — MIDAZOLAM HCL 2 MG/2ML IJ SOLN
INTRAMUSCULAR | Status: AC
  Filled 2021-05-13: qty 6

## 2021-05-13 MED ORDER — CIPROFLOXACIN IN D5W 400 MG/200ML IV SOLN
INTRAVENOUS | Status: DC | PRN
  Administered 2021-05-13: 400 mg via INTRAVENOUS

## 2021-05-13 MED ORDER — HYDRALAZINE HCL 20 MG/ML IJ SOLN
INTRAMUSCULAR | Status: DC | PRN
  Administered 2021-05-13: 10 mg via INTRAVENOUS

## 2021-05-13 MED ORDER — LIDOCAINE HCL (PF) 1 % IJ SOLN
INTRAMUSCULAR | Status: DC | PRN
  Administered 2021-05-13: 5 mL

## 2021-05-13 MED ORDER — PROPOFOL 10 MG/ML IV BOLUS
INTRAVENOUS | Status: DC | PRN
  Administered 2021-05-13: 100 mg via INTRAVENOUS

## 2021-05-13 MED ORDER — PROMETHAZINE HCL 25 MG/ML IJ SOLN
6.2500 mg | Freq: Four times a day (QID) | INTRAMUSCULAR | Status: DC | PRN
  Administered 2021-05-13: 11:00:00 6.25 mg via INTRAVENOUS

## 2021-05-13 MED ORDER — PHENYLEPHRINE HCL-NACL 20-0.9 MG/250ML-% IV SOLN
INTRAVENOUS | Status: DC | PRN
  Administered 2021-05-13: 40 ug/min via INTRAVENOUS

## 2021-05-13 MED ORDER — INDOMETHACIN 50 MG RE SUPP
RECTAL | Status: AC
  Filled 2021-05-13: qty 2

## 2021-05-13 MED ORDER — SUGAMMADEX SODIUM 200 MG/2ML IV SOLN
INTRAVENOUS | Status: DC | PRN
Start: 2021-05-13 — End: 2021-05-13
  Administered 2021-05-13: 200 mg via INTRAVENOUS

## 2021-05-13 MED ORDER — FENTANYL CITRATE (PF) 100 MCG/2ML IJ SOLN
INTRAMUSCULAR | Status: DC | PRN
  Administered 2021-05-13 (×2): 50 ug via INTRAVENOUS

## 2021-05-13 MED ORDER — MIDAZOLAM HCL 2 MG/2ML IJ SOLN
INTRAMUSCULAR | Status: DC | PRN
  Administered 2021-05-13 (×8): .5 mg via INTRAVENOUS

## 2021-05-13 MED ORDER — FENTANYL CITRATE PF 50 MCG/ML IJ SOSY
12.5000 ug | PREFILLED_SYRINGE | INTRAMUSCULAR | Status: DC | PRN
  Administered 2021-05-13 (×2): 12.5 ug via INTRAVENOUS
  Filled 2021-05-13: qty 1

## 2021-05-13 MED ORDER — HYDRALAZINE HCL 20 MG/ML IJ SOLN
INTRAMUSCULAR | Status: AC
Start: 2021-05-13 — End: 2021-05-14
  Filled 2021-05-13: qty 1

## 2021-05-13 MED ORDER — DEXAMETHASONE SODIUM PHOSPHATE 10 MG/ML IJ SOLN
INTRAMUSCULAR | Status: DC | PRN
Start: 2021-05-13 — End: 2021-05-13
  Administered 2021-05-13: 5 mg via INTRAVENOUS

## 2021-05-13 MED ORDER — NAPHAZOLINE-GLYCERIN 0.012-0.25 % OP SOLN
2.0000 [drp] | Freq: Four times a day (QID) | OPHTHALMIC | Status: DC | PRN
  Filled 2021-05-13: qty 15

## 2021-05-13 MED ORDER — SODIUM CHLORIDE 0.9 % IV SOLN: INTRAVENOUS | Status: DC

## 2021-05-13 MED ORDER — PHENYLEPHRINE 40 MCG/ML (10ML) SYRINGE FOR IV PUSH (FOR BLOOD PRESSURE SUPPORT)
PREFILLED_SYRINGE | INTRAVENOUS | Status: DC | PRN
  Administered 2021-05-13: 120 ug via INTRAVENOUS

## 2021-05-13 MED ORDER — SODIUM CHLORIDE 0.9% FLUSH: 10.0000 mL | Freq: Two times a day (BID) | INTRAVENOUS | Status: DC

## 2021-05-13 MED ORDER — ONDANSETRON HCL 4 MG/2ML IJ SOLN
INTRAMUSCULAR | Status: DC | PRN
  Administered 2021-05-13: 4 mg via INTRAVENOUS

## 2021-05-13 MED ORDER — MIDAZOLAM HCL 2 MG/2ML IJ SOLN
INTRAMUSCULAR | Status: AC
  Filled 2021-05-13: qty 2

## 2021-05-13 MED ORDER — LACTATED RINGERS IV SOLN: INTRAVENOUS | Status: DC | PRN

## 2021-05-13 MED ORDER — GLUCAGON HCL RDNA (DIAGNOSTIC) 1 MG IJ SOLR
INTRAMUSCULAR | Status: AC
  Filled 2021-05-13: qty 1

## 2021-05-13 MED ORDER — CIPROFLOXACIN IN D5W 400 MG/200ML IV SOLN
INTRAVENOUS | Status: AC
  Filled 2021-05-13: qty 200

## 2021-05-13 MED ORDER — SODIUM CHLORIDE 0.9% FLUSH
10.0000 mL | Freq: Two times a day (BID) | INTRAVENOUS | Status: DC
  Administered 2021-05-14 – 2021-05-17 (×6): 10 mL

## 2021-05-13 MED ORDER — ROCURONIUM BROMIDE 10 MG/ML (PF) SYRINGE
PREFILLED_SYRINGE | INTRAVENOUS | Status: DC | PRN
  Administered 2021-05-13: 60 mg via INTRAVENOUS
  Administered 2021-05-13: 10 mg via INTRAVENOUS

## 2021-05-13 MED ORDER — FENTANYL CITRATE PF 50 MCG/ML IJ SOSY
12.5000 ug | PREFILLED_SYRINGE | INTRAMUSCULAR | Status: DC | PRN
  Administered 2021-05-14 – 2021-05-15 (×5): 12.5 ug via INTRAVENOUS
  Filled 2021-05-13 (×5): qty 1

## 2021-05-13 MED ORDER — INDOMETHACIN 50 MG RE SUPP
RECTAL | Status: DC | PRN
  Administered 2021-05-13: 100 mg via RECTAL

## 2021-05-13 NOTE — Progress Notes (Signed)
PROGRESS NOTE    Tonya Jenkins  VWP:[id number] DOB: 04/21/1945 DOA: 05/11/2021 PCP: [name], MD    Chief Complaint  Patient presents with   Jaundice    Brief Narrative:   Tonya Jenkins is a 76 y.o. 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  Symptoms started approximately 6 days ago where she developed itching of her skin along with red bumpy rash.  Itching is severe with only mild relief with ointment cream she was given.  Associated symptoms include severe fatigue, intermittent abdominal pain, change in skin color, diarrhea, change in stool color (white stools ).  Denies any history of alcohol or drug abuse.  Denies any significant fever, weight loss, blood in stool, chest pain, or vomiting,.  Due to her symptoms she had initially gone to urgent care twice and subsequently followed up with dermatology for which she was noted to be jaundiced and told to come to the ER.   Upon admission to the emergency department patient was seen to be afebrile with blood pressures elevated up to 167/87, and all other vital signs relatively maintained.  Labs significant for hemoglobin 11.5, sodium 134, potassium 3.3, BUN 19, creatinine 1.21, glucose 181, alkaline phosphatase 402, AST 686, ALT 124.2, total bilirubin 8.7, and INR 0.9.  Urinalysis noted moderate hemoglobin trace leukocytes, rare bacteria, and 6-10 WBCs.  MRCP was able to be obtained which noted 3.6 x 2.1 x 4.2 cm enhancing lesion at the head of the pancreas obstructing the pancreatic and common bile duct concerning for neoplasm.  Patient had received Ativan,     Assessment & Plan:   Principal Problem:   Obstructive jaundice secondary to pancreatic mass Active Problems:   Pancreatic mass   Possible AKI (acute kidney injury) ([place])   Normocytic  anemia   Essential hypertension   Hypokalemia   Abnormal urinalysis   Hyperbilirubinemia   Elevated liver enzymes   Hyperglycemia   OSA on CPAP  Obstructive jaundice secondary to pancreatic mass - Patient presented due to concerns of jaundice with severe itching.  Significant elevated LFTs, concern for pancreatic malignancy . -GI input greatly appreciated, she went for ERCP today, with unsuccessful stent placement, discussed with Dr. Carlean Jews, plan for biopsy tomorrow . -IR has been consulted for PTC today . -Continue with IV Rocephin and Flagyl . -Continue with IV fluids . -Keep n.p.o. for now . -Colestyramine 400 mg twice daily for itching secondary to hyperbilirubinemia   AKI (acute kidney injury) ([place]) -Continue with IV fluids   Essential hypertension On admission blood pressures elevated up to 166/95.  Home blood pressure regimen includes olmesartan 40 mg daily and Cardizem 180 mg nightly. -Continue Cardizem and pharmacy substitution for olmesartan   Normocytic anemia On admission patient was noted to have hemoglobin 11.5 g/dL with elevated RDW of 17.2.  No recent hemoglobin to compare. -Add on iron studies -Recheck CBC in a.m.   OSA on CPAP Patient reports using CPAP at night as prescribed. - Continue CPAP per RT at night   Hyperglycemia Acute.  Initial glucose elevated up to 181.  Patient without prior history of diabetes.  -Check hemoglobin A1c   Elevated liver enzymes Secondary to above.   Hyperbilirubinemia Secondary to above.   Abnormal urinalysis On admission patient was noted to have urinalysis with moderate hemoglobin, trace leukocytes, rare bacteria,  and 6-10 WBCs. -Check urine culture   Hypokalemia Repleted       DVT prophylaxis: SCD , hold chemical prophylaxis for anticipated procedures Code Status: Full Family Communication: D/W friend at bedside with patients permission Disposition:   Status is: Inpatient    Consultants:   IR GI    Subjective:  She reports left eye tearing, abdominal pain, nausea and itching.  Objective: Vitals:   05/13/21 1045 05/13/21 1152 05/13/21 1200 05/13/21 1224  BP: (!) 161/83 (!) 153/65 (!) 145/70   Pulse: 60 (!) 54 (!) 55 (!) 57  Resp: '10 13 12 12  ' Temp: 98 F (36.7 C) (!) 97.5 F (36.4 C)    TempSrc:  Oral    SpO2: 99% 95% 94% 93%  Weight:      Height:        Intake/Output Summary (Last 24 hours) at 05/13/2021 1518 Last data filed at 05/13/2021 0940 Gross per 24 hour  Intake 1162.2 ml  Output 0 ml  Net 1162.2 ml   Filed Weights   05/12/21 1700 05/13/21 0720  Weight: 67.2 kg 67.2 kg    Examination:  Awake Alert, Oriented X 3, jaundiced Symmetrical Chest wall movement, Good air movement bilaterally, CTAB RRR,No Gallops,Rubs or new Murmurs, No Parasternal Heave +ve B.Sounds, Abd Soft, mild tenderness in right abdomen No Cyanosis, Clubbing or edema, No new Rash or bruise      Data Reviewed: I have personally reviewed following labs and imaging studies  CBC: Recent Labs  Lab 05/11/21 2030 05/13/21 0241  WBC 6.6 6.3  NEUTROABS 4.2  --   HGB 11.5* 10.3*  HCT 34.7* 29.5*  MCV 82.0 80.4  PLT 307 885    Basic Metabolic Panel: Recent Labs  Lab 05/11/21 2030 05/13/21 0241  NA 134* 137  K 3.3* 3.6  CL 98 106  CO2 26 21*  GLUCOSE 181* 147*  BUN 19 16  CREATININE 1.21* 1.14*  CALCIUM 9.4 8.7*    GFR: Estimated Creatinine Clearance: 36.5 mL/min (A) (by C-G formula based on SCr of 1.14 mg/dL (H)).  Liver Function Tests: Recent Labs  Lab 05/11/21 2030 05/13/21 0241  AST 686* 392*  ALT 1,242* 856*  ALKPHOS 402* 336*  BILITOT 8.7* 8.2*  PROT 7.1 5.7*  ALBUMIN 3.6 2.9*    CBG: Recent Labs  Lab 05/12/21 1003 05/13/21 0946  GLUCAP 121* 153*     Recent Results (from the past 240 hour(s))  Resp Panel by RT-PCR (Flu A&B, Covid) Nasopharyngeal Swab     Status: None   Collection Time: 05/12/21  4:37 AM   Specimen: Nasopharyngeal Swab;  Nasopharyngeal(NP) swabs in vial transport medium  Result Value Ref Range Status   SARS Coronavirus 2 by RT PCR NEGATIVE NEGATIVE Final    Comment: (NOTE) SARS-CoV-2 target nucleic acids are NOT DETECTED.  The SARS-CoV-2 RNA is generally detectable in upper respiratory specimens during the acute phase of infection. The lowest concentration of SARS-CoV-2 viral copies this assay can detect is 138 copies/mL. A negative result does not preclude SARS-Cov-2 infection and should not be used as the sole basis for treatment or other patient management decisions. A negative result may occur with  improper specimen collection/handling, submission of specimen other than nasopharyngeal swab, presence of viral mutation(s) within the areas targeted by this assay, and inadequate number of viral copies(<138 copies/mL). A negative result must be combined with clinical observations, patient history, and epidemiological information. The expected result is Negative.  Fact Sheet for Patients:  EntrepreneurPulse.com.au  Fact Sheet for Healthcare Providers:  IncredibleEmployment.be  This test is no t yet approved or cleared by the [place] FDA and  has been authorized for detection and/or diagnosis of SARS-CoV-2 by FDA under an Emergency Use Authorization (EUA). This EUA will remain  in effect (meaning this test can be used) for the duration of the COVID-19 declaration under Section 564(b)(1) of the Act, 21 U.S.C.section 360bbb-3(b)(1), unless the authorization is terminated  or revoked sooner.       Influenza A by PCR NEGATIVE NEGATIVE Final   Influenza B by PCR NEGATIVE NEGATIVE Final    Comment: (NOTE) The Xpert Xpress SARS-CoV-2/FLU/RSV plus assay is intended as an aid in the diagnosis of influenza from Nasopharyngeal swab specimens and should not be used as a sole basis for treatment. Nasal washings and aspirates are unacceptable for Xpert Xpress  SARS-CoV-2/FLU/RSV testing.  Fact Sheet for Patients: EntrepreneurPulse.com.au  Fact Sheet for Healthcare Providers: IncredibleEmployment.be  This test is not yet approved or cleared by the [place] FDA and has been authorized for detection and/or diagnosis of SARS-CoV-2 by FDA under an Emergency Use Authorization (EUA). This EUA will remain in effect (meaning this test can be used) for the duration of the COVID-19 declaration under Section 564(b)(1) of the Act, 21 U.S.C. section 360bbb-3(b)(1), unless the authorization is terminated or revoked.  Performed at [facility], [place] [address] [place], [place] [address]   Urine Culture     Status: Abnormal   Collection Time: 05/12/21 11:18 AM   Specimen: Urine, Random  Result Value Ref Range Status   Specimen Description URINE, RANDOM  Final   Special Requests   Final    Immunocompromised Performed at [facility], [place] [address] [place], [place] [address]    Culture MULTIPLE SPECIES PRESENT, SUGGEST RECOLLECTION (A)  Final   Report Status 05/13/2021 FINAL  Final         Radiology Studies: US Abdomen Complete  Result Date: 05/11/2021 CLINICAL DATA:  Left upper quadrant tenderness. EXAM: ABDOMEN ULTRASOUND COMPLETE COMPARISON:  CT 07/21/2020 FINDINGS: Gallbladder: Distended. No gallstones or wall thickening visualized. No sonographic Murphy sign noted by sonographer. Common bile duct: Diameter: 7 mm proximally, flares to 10 mm distally Liver: No focal lesion identified. Increased and heterogeneous in parenchymal echogenicity. No capsular nodularity. Portal vein is patent on color Doppler imaging with normal direction of blood flow towards the liver. IVC: No abnormality visualized. Pancreas: Dilated pancreatic duct at 10 mm. No pancreatic mass is seen. Spleen: Normal in size. No focal sonographic abnormality. Calcified granuloma on CT are not seen. Right Kidney: Length: 9.8 cm.  Suggestion of mild hydronephrosis. No visualized renal calculi. Lower pole cyst on prior CT is not seen by ultrasound. Left Kidney: Length: 9.5 cm. No hydronephrosis. No visualized renal calculi. No focal renal abnormality. Abdominal aorta: No aneurysm visualized. Other findings: No abdominal ascites. IMPRESSION: 1. Dilatation of the common bile duct and pancreatic ducts. This can be seen in the setting of pancreatic head mass, although no focal pancreatic lesion is seen by ultrasound. Recommend further assessment with created protocol MRI, or contrast-enhanced abdominopelvic CT if patient cannot tolerate breath hold technique. 2. Mild right hydronephrosis, of unknown etiology. 3. Hepatic steatosis. Electronically Signed   By: Keith Rake M.D.   On: 05/11/2021 21:25   CT ABDOMEN PELVIS W CONTRAST  Result Date: 05/12/2021 CLINICAL DATA:  Abdominal pain. EXAM: CT ABDOMEN AND PELVIS WITH CONTRAST TECHNIQUE: Multidetector CT imaging of the abdomen and  pelvis was performed using the standard protocol following bolus administration of intravenous contrast. RADIATION DOSE REDUCTION: This exam was performed according to the departmental dose-optimization program which includes automated exposure control, adjustment of the mA and/or kV according to patient size and/or use of iterative reconstruction technique. CONTRAST:  60m OMNIPAQUE IOHEXOL 300 MG/ML  SOLN COMPARISON:  Jul 21, 2020 FINDINGS: Lower chest: No acute abnormality. Hepatobiliary: No focal liver abnormality is seen. Marked severity intrahepatic biliary dilatation is noted. The gallbladder is markedly distended without evidence of gallstones are gallbladder wall thickening. The common bile duct is dilated and measures approximately 13 mm in diameter. Abrupt narrowing of the common bile duct is seen at the level of the pancreatic head. (Axial CT images 28 through 31, CT series 3. This represents a new finding when compared to the prior study. Pancreas:  Pancreatic duct is also dilated (12 mm in diameter) with abrupt narrowing seen within the pancreatic head (axial CT images 30 through 32, CT series 3). This represents a new finding when compared to the prior exam. Mild prominence of the pancreatic head is noted without an underlying mass lesion. No peripancreatic inflammatory fat stranding is seen. Spleen: Normal in size without focal abnormality. Adrenals/Urinary Tract: Adrenal glands are unremarkable. Kidneys are normal in size, without renal calculi or hydronephrosis. Small bilateral simple renal cysts are noted. Bladder is unremarkable. Stomach/Bowel: Stomach is within normal limits. Appendix appears normal. No evidence of bowel wall thickening, distention, or inflammatory changes. Noninflamed diverticula are seen throughout the large bowel. Vascular/Lymphatic: Aortic atherosclerosis. No enlarged abdominal or pelvic lymph nodes. Reproductive: Uterus and bilateral adnexa are unremarkable. Other: There is a 2.1 cm 1.0 cm fat containing umbilical hernia. No abdominopelvic ascites. Musculoskeletal: Multilevel degenerative changes are noted throughout the lumbar spine. IMPRESSION: 1. Marked severity intrahepatic and extrahepatic biliary dilatation with an abrupt narrowing of the common bile duct and pancreatic duct at the level of the pancreatic head. Further correlation with MRCP is recommended. 2. Markedly distended gallbladder without evidence of cholelithiasis or acute cholecystitis. 3. Colonic diverticulosis. 4. Small bilateral simple renal cysts. 5. 2.1 cm fat containing umbilical hernia. 6. Aortic atherosclerosis. Aortic Atherosclerosis (ICD10-I70.0). Electronically Signed   By: TVirgina NorfolkM.D.   On: 05/12/2021 01:23   DG ERCP  Result Date: 05/13/2021 CLINICAL DATA:  Biliary obstruction. EXAM: ERCP COMPARISON:  CT AP, 05/12/2021 and 07/21/2020.  MRCP, 05/12/2021. FLUOROSCOPY: Exposure Index (as provided by the fluoroscopic device): 37 mGy Kerma  FINDINGS: Limited oblique planar images of the RIGHT upper quadrant obtained C-arm. Images demonstrating flexible endoscopy, attempted biliary duct cannulation and retrograde cholangiogram. IMPRESSION: Fluoroscopic imaging for ERCP. No discrete retrograde biliary opacification For complete description of intra procedural findings, please see performing service dictation. Electronically Signed   By: JMichaelle BirksM.D.   On: 05/13/2021 09:40   MR ABDOMEN MRCP W WO CONTAST  Result Date: 05/12/2021 CLINICAL DATA:  76year old female with history of jaundice and abdominal pain. Biliary obstruction noted on recent CT examination. Follow-up study. EXAM: MRI ABDOMEN WITHOUT AND WITH CONTRAST (INCLUDING MRCP) TECHNIQUE: Multiplanar multisequence MR imaging of the abdomen was performed both before and after the administration of intravenous contrast. Heavily T2-weighted images of the biliary and pancreatic ducts were obtained, and three-dimensional MRCP images were rendered by post processing. CONTRAST:  754mGADAVIST GADOBUTROL 1 MMOL/ML IV SOLN COMPARISON:  No prior abdominal MRI. CT of the abdomen and pelvis 05/12/2021. FINDINGS: Comment: Portions of today's examination are limited by considerable patient respiratory motion. Lower chest:  Unremarkable. Hepatobiliary: Within the limitations of today's examination, there are no definite suspicious cystic or solid hepatic lesions. Severe intra and extrahepatic biliary ductal dilatation is noted. Gallbladder is moderately distended. Gallbladder wall thickness is normal. No filling defects to suggest gallstones. No definite pericholecystic fluid. No filling defects within the common bile duct to suggest choledocholithiasis. Common bile duct measures up to 14 mm in the porta hepatis. There is abrupt cut off of the distal common bile duct in the region of the pancreatic head. Pancreas: MRCP images demonstrate severe diffuse pancreatic ductal dilatation measuring up to 8 mm in the  pancreatic body. In the pancreatic head there is a enhancing mass-like area which is poorly delineated on today's motion limited examination, but estimated to measure approximately 3.6 x 2.1 x 4.2 cm (axial image 55 of series 22 and coronal image 51 of series 31), concerning for pancreatic neoplasm. This causes abrupt cut off of the distal pancreatic duct, as well as the distal common bile duct. No peripancreatic fluid collections or inflammatory changes. Spleen:  Unremarkable. Adrenals/Urinary Tract: Multiple small T1 hypointense, T2 hyperintense, nonenhancing lesions in both kidneys are compatible with tiny simple cysts. No hydroureteronephrosis in the visualized portions of the abdomen. Bilateral adrenal glands are normal in appearance. Stomach/Bowel: Visualized portions are unremarkable. Vascular/Lymphatic: No aneurysm identified in the visualized abdominal vasculature. No lymphadenopathy noted in the abdomen Other: No significant volume of ascites noted in the visualized portions of the peritoneal cavity. Musculoskeletal: No aggressive appearing osseous lesions are noted in the visualized portions of the skeleton. IMPRESSION: 1. 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 Further evaluation with endoscopic ultrasound is strongly recommended at this time. Electronically Signed   By: Vinnie Langton M.D.   On: 05/12/2021 07:05        Scheduled Meds:  cholestyramine  4 g Oral BID   diltiazem  180 mg Oral QHS   enoxaparin (LOVENOX) injection  40 mg Subcutaneous Q24H   irbesartan  300 mg Oral Daily   melatonin  3 mg Oral QHS   sodium chloride flush  3 mL Intravenous Q12H   Continuous Infusions:  sodium chloride 75 mL/hr at 05/13/21 1229   ciprofloxacin     metronidazole 500 mg (05/13/21 1433)     LOS: 1 day       Phillips Climes, MD [facility]   To  contact the attending provider between 7A-7P or the covering provider during after hours 7P-7A, please log into the web site [email or web address] and access using universal [place] password for that web site. If you do not have the password, please call the hospital operator.  05/13/2021, 3:18 PM

## 2021-05-13 NOTE — Anesthesia Procedure Notes (Signed)
Procedure Name: Intubation ?Date/Time: 05/13/2021 7:58 AM ?Performed by: Reece Agar, CRNA ?Pre-anesthesia Checklist: Patient identified, Emergency Drugs available, Suction available and Patient being monitored ?Patient Re-evaluated:Patient Re-evaluated prior to induction ?Oxygen Delivery Method: Circle System Utilized ?Preoxygenation: Pre-oxygenation with 100% oxygen ?Induction Type: IV induction ?Ventilation: Mask ventilation without difficulty ?Laryngoscope Size: Mac and 3 ?Grade View: Grade I ?Tube type: Oral ?Number of attempts: 1 ?Airway Equipment and Method: Stylet ?Placement Confirmation: ETT inserted through vocal cords under direct vision, positive ETCO2 and breath sounds checked- equal and bilateral ?Secured at: 21 cm ?Tube secured with: Tape ?Dental Injury: Teeth and Oropharynx as per pre-operative assessment  ? ? ? ? ?

## 2021-05-13 NOTE — Procedures (Addendum)
Vascular and Interventional Radiology Procedure Note ? ?Patient: Tonya Jenkins ?DOB: 10/17/1945 ?Medical Record Number: [id number] ?Note Date/Time: 05/13/21 5:28 PM  ? ?Performing Physician: Michaelle Birks, MD ?Assistant(s): Daryll Brod, MD and [facility] IR RT ? ?Diagnosis: Malignant distal CBD obstruction ? ?Procedure:  ?ANTEROGRADE CHOLANGIOGRAM ?PERCUTANEOUS BILIARY DRAINAGE CATHETER PLACEMENT via CHOLECYSTOSTOMY ACCESS  ? ?Anesthesia: Conscious Sedation ?Complications: None ?Estimated Blood Loss: Minimal ?Specimens:  None ? ?Findings:  ?Distal obstruction resulting in flush occlusion on cholangiogram. ?Transpapillary access into duodenum after considerable effort. ?Successful placement of 45F internalized / externalized biliary drainage catheter via cholecystostomy access. ? ?Plan: Flush tube w 10 mL sterile NS and record drain output qShift. ?Follow up for routine tube exchange in 2 month(s).  ? ?See detailed procedure note with images in PACS. ?The patient tolerated the procedure well without incident or complication and was returned to Floor Bed in stable condition.  ? ? ?Michaelle Birks, MD ?[facility] ?[facility] ? ? ?Pager. [phone] ?Clinic. [phone]  ?

## 2021-05-13 NOTE — Transfer of Care (Signed)
Immediate Anesthesia Transfer of Care Note ? ?Patient: Tonya Jenkins ? ?Procedure(s) Performed: ENDOSCOPIC RETROGRADE CHOLANGIOPANCREATOGRAPHY (ERCP) ?UPPER ENDOSCOPIC ULTRASOUND (EUS) LINEAR ? ?Patient Location: PACU ? ?Anesthesia Type:General ? ?Level of Consciousness: awake and alert  ? ?Airway & Oxygen Therapy: Patient Spontanous Breathing and Patient connected to face mask oxygen ? ?Post-op Assessment: Report given to RN and Post -op Vital signs reviewed and stable ? ?Post vital signs: Reviewed and stable ? ?Last Vitals:  ?Vitals Value Taken Time  ?BP 152/67 05/13/21 0942  ?Temp    ?Pulse 57 05/13/21 0946  ?Resp 12 05/13/21 0946  ?SpO2 100 % 05/13/21 0946  ?Vitals shown include unvalidated device data. ? ?Last Pain:  ?Vitals:  ? 05/13/21 0720  ?TempSrc: Temporal  ?PainSc: 0-No pain  ?   ? ?  ? ?Complications: No notable events documented. ?

## 2021-05-13 NOTE — H&P (Signed)
Chief Complaint: Patient was seen in consultation today for percutaneous biliary drainage catheter, possible cholecystostomy at the request of Dr. Benson Norway  Referring Physician(s): Dr. Benson Norway  Supervising Physician: Michaelle Birks  Patient Status: [facility] - In-pt  History of Present Illness: Tonya Jenkins is a 76 y.o. female w/ PMH significant for essential HTN and OSA on CPAP. Pt presented to ED 05/11/21 c/o jaundice, fatigue, abdominal pain, diarrhea, white stools and pruritis. Pt was found to have abnormal LFTs, hyperbilirubinemia, hypokalemia and lesion to the head of the pancreas obstructing the pancreatic and common bile duct. GI was consulted and attempted ERCP but failed d/t inability to cannulate. Dr. Maryelizabeth Kaufmann was consulted to place percutaneous biliary drainage catheter with possible cholecystostomy.   MR Abd 05/11/21: IMPRESSION: 1. 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 Further evaluation with endoscopic ultrasound is strongly recommended at this time.  Past Medical History:  Diagnosis Date   History of hip surgery    Hx of neck surgery    Hypertension     Past Surgical History:  Procedure Laterality Date   CERVICAL SPINE SURGERY     HAMMER TOE SURGERY     HIP SURGERY     KNEE SURGERY      Allergies: Amlodipine besylate, Hydrochlorothiazide, Lexapro [escitalopram oxalate], Oxycodone, and Penicillins  Medications: Prior to Admission medications   Medication Sig Start Date End Date Taking? Authorizing Provider  B Complex Vitamins (VITAMIN B COMPLEX PO) Take 1 capsule by mouth daily.   Yes [provider]  diltiazem (DILACOR XR) 180 MG 24 hr capsule Take 180 mg by mouth at bedtime. 04/27/21  Yes [provider]  melatonin 3 MG TABS tablet Take 3 mg by mouth at bedtime.   Yes [provider]  NON FORMULARY CPAP at bedtime    Yes [provider]  olmesartan (BENICAR) 40 MG tablet Take 40 mg by mouth at bedtime. 03/17/20  Yes [provider]  triamcinolone lotion (KENALOG) 0.1 % Apply 0.1 application. topically See admin instructions. Bid x 7 days 05/06/21  Yes [provider]  hydrOXYzine (ATARAX) 25 MG tablet Take 25 mg by mouth every 8 (eight) hours as needed for anxiety or itching. 05/06/21   [provider]     History reviewed. No pertinent family history.  Social History   Socioeconomic History   Marital status: Divorced    Spouse name: Not on file   Number of children: Not on file   Years of education: Not on file   Highest education level: Not on file  Occupational History   Not on file  Tobacco Use   Smoking status: Never   Smokeless tobacco: Never  Substance and Sexual Activity   Alcohol use: Not Currently   Drug use: Not Currently   Sexual activity: Not on file  Other Topics Concern   Not on file  Social History Narrative   Not on file   Social Determinants of Health   Financial Resource Strain: Not on file  Food Insecurity: Not on file  Transportation Needs: Not on file  Physical Activity: Not on file  Stress: Not on file  Social Connections: Not on file    Review of Systems: A 12 point ROS discussed and pertinent positives are indicated in the HPI above.  All other systems are negative.  Review of Systems  Gastrointestinal:  Positive for abdominal  pain and diarrhea.  Skin:  Positive for color change.       Puritis   Vital Signs: BP 136/76    Pulse 69    Temp 97.7 F (36.5 C) (Temporal)    Resp 12    Ht 5' (1.524 m)    Wt 148 lb 2.4 oz (67.2 kg)    SpO2 97%    BMI 28.93 kg/m   Physical Exam Constitutional:      Appearance: She is ill-appearing.  HENT:     Head: Normocephalic and atraumatic.     Mouth/Throat:     Mouth: Mucous membranes are dry.     Pharynx: Oropharynx is clear.  Eyes:     General: Scleral icterus present.      Extraocular Movements: Extraocular movements intact.     Pupils: Pupils are equal, round, and reactive to light.  Cardiovascular:     Rate and Rhythm: Regular rhythm. Bradycardia present.     Pulses: Normal pulses.     Heart sounds: Normal heart sounds.  Pulmonary:     Effort: Pulmonary effort is normal. No respiratory distress.     Breath sounds: Normal breath sounds. No stridor. No wheezing, rhonchi or rales.  Abdominal:     General: Bowel sounds are normal. There is no distension.     Tenderness: There is no abdominal tenderness. There is no guarding.  Musculoskeletal:     Right lower leg: No edema.     Left lower leg: No edema.  Skin:    General: Skin is warm and dry.     Coloration: Skin is jaundiced.  Neurological:     Mental Status: She is alert and oriented to person, place, and time.  Psychiatric:        Mood and Affect: Mood normal.        Behavior: Behavior normal.    Imaging: US Abdomen Complete  Result Date: 05/11/2021 CLINICAL DATA:  Left upper quadrant tenderness. EXAM: ABDOMEN ULTRASOUND COMPLETE COMPARISON:  CT 07/21/2020 FINDINGS: Gallbladder: Distended. No gallstones or wall thickening visualized. No sonographic Murphy sign noted by sonographer. Common bile duct: Diameter: 7 mm proximally, flares to 10 mm distally Liver: No focal lesion identified. Increased and heterogeneous in parenchymal echogenicity. No capsular nodularity. Portal vein is patent on color Doppler imaging with normal direction of blood flow towards the liver. IVC: No abnormality visualized. Pancreas: Dilated pancreatic duct at 10 mm. No pancreatic mass is seen. Spleen: Normal in size. No focal sonographic abnormality. Calcified granuloma on CT are not seen. Right Kidney: Length: 9.8 cm. Suggestion of mild hydronephrosis. No visualized renal calculi. Lower pole cyst on prior CT is not seen by ultrasound. Left Kidney: Length: 9.5 cm. No hydronephrosis. No visualized renal calculi. No focal renal  abnormality. Abdominal aorta: No aneurysm visualized. Other findings: No abdominal ascites. IMPRESSION: 1. Dilatation of the common bile duct and pancreatic ducts. This can be seen in the setting of pancreatic head mass, although no focal pancreatic lesion is seen by ultrasound. Recommend further assessment with created protocol MRI, or contrast-enhanced abdominopelvic CT if patient cannot tolerate breath hold technique. 2. Mild right hydronephrosis, of unknown etiology. 3. Hepatic steatosis. Electronically Signed   By: Keith Rake M.D.   On: 05/11/2021 21:25   CT ABDOMEN PELVIS W CONTRAST  Result Date: 05/12/2021 CLINICAL DATA:  Abdominal pain. EXAM: CT ABDOMEN AND PELVIS WITH CONTRAST TECHNIQUE: Multidetector CT imaging of the abdomen and pelvis was performed using the standard protocol following bolus administration of intravenous  contrast. RADIATION DOSE REDUCTION: This exam was performed according to the departmental dose-optimization program which includes automated exposure control, adjustment of the mA and/or kV according to patient size and/or use of iterative reconstruction technique. CONTRAST:  4m OMNIPAQUE IOHEXOL 300 MG/ML  SOLN COMPARISON:  Jul 21, 2020 FINDINGS: Lower chest: No acute abnormality. Hepatobiliary: No focal liver abnormality is seen. Marked severity intrahepatic biliary dilatation is noted. The gallbladder is markedly distended without evidence of gallstones are gallbladder wall thickening. The common bile duct is dilated and measures approximately 13 mm in diameter. Abrupt narrowing of the common bile duct is seen at the level of the pancreatic head. (Axial CT images 28 through 31, CT series 3. This represents a new finding when compared to the prior study. Pancreas: Pancreatic duct is also dilated (12 mm in diameter) with abrupt narrowing seen within the pancreatic head (axial CT images 30 through 32, CT series 3). This represents a new finding when compared to the prior exam.  Mild prominence of the pancreatic head is noted without an underlying mass lesion. No peripancreatic inflammatory fat stranding is seen. Spleen: Normal in size without focal abnormality. Adrenals/Urinary Tract: Adrenal glands are unremarkable. Kidneys are normal in size, without renal calculi or hydronephrosis. Small bilateral simple renal cysts are noted. Bladder is unremarkable. Stomach/Bowel: Stomach is within normal limits. Appendix appears normal. No evidence of bowel wall thickening, distention, or inflammatory changes. Noninflamed diverticula are seen throughout the large bowel. Vascular/Lymphatic: Aortic atherosclerosis. No enlarged abdominal or pelvic lymph nodes. Reproductive: Uterus and bilateral adnexa are unremarkable. Other: There is a 2.1 cm 1.0 cm fat containing umbilical hernia. No abdominopelvic ascites. Musculoskeletal: Multilevel degenerative changes are noted throughout the lumbar spine. IMPRESSION: 1. Marked severity intrahepatic and extrahepatic biliary dilatation with an abrupt narrowing of the common bile duct and pancreatic duct at the level of the pancreatic head. Further correlation with MRCP is recommended. 2. Markedly distended gallbladder without evidence of cholelithiasis or acute cholecystitis. 3. Colonic diverticulosis. 4. Small bilateral simple renal cysts. 5. 2.1 cm fat containing umbilical hernia. 6. Aortic atherosclerosis. Aortic Atherosclerosis (ICD10-I70.0). Electronically Signed   By: TVirgina NorfolkM.D.   On: 05/12/2021 01:23   DG ERCP  Result Date: 05/13/2021 CLINICAL DATA:  Biliary obstruction. EXAM: ERCP COMPARISON:  CT AP, 05/12/2021 and 07/21/2020.  MRCP, 05/12/2021. FLUOROSCOPY: Exposure Index (as provided by the fluoroscopic device): 37 mGy Kerma FINDINGS: Limited oblique planar images of the RIGHT upper quadrant obtained C-arm. Images demonstrating flexible endoscopy, attempted biliary duct cannulation and retrograde cholangiogram. IMPRESSION: Fluoroscopic  imaging for ERCP. No discrete retrograde biliary opacification For complete description of intra procedural findings, please see performing service dictation. Electronically Signed   By: JMichaelle BirksM.D.   On: 05/13/2021 09:40   MR ABDOMEN MRCP W WO CONTAST  Result Date: 05/12/2021 CLINICAL DATA:  76year old female with history of jaundice and abdominal pain. Biliary obstruction noted on recent CT examination. Follow-up study. EXAM: MRI ABDOMEN WITHOUT AND WITH CONTRAST (INCLUDING MRCP) TECHNIQUE: Multiplanar multisequence MR imaging of the abdomen was performed both before and after the administration of intravenous contrast. Heavily T2-weighted images of the biliary and pancreatic ducts were obtained, and three-dimensional MRCP images were rendered by post processing. CONTRAST:  754mGADAVIST GADOBUTROL 1 MMOL/ML IV SOLN COMPARISON:  No prior abdominal MRI. CT of the abdomen and pelvis 05/12/2021. FINDINGS: Comment: Portions of today's examination are limited by considerable patient respiratory motion. Lower chest: Unremarkable. Hepatobiliary: Within the limitations of today's examination, there are no definite  suspicious cystic or solid hepatic lesions. Severe intra and extrahepatic biliary ductal dilatation is noted. Gallbladder is moderately distended. Gallbladder wall thickness is normal. No filling defects to suggest gallstones. No definite pericholecystic fluid. No filling defects within the common bile duct to suggest choledocholithiasis. Common bile duct measures up to 14 mm in the porta hepatis. There is abrupt cut off of the distal common bile duct in the region of the pancreatic head. Pancreas: MRCP images demonstrate severe diffuse pancreatic ductal dilatation measuring up to 8 mm in the pancreatic body. In the pancreatic head there is a enhancing mass-like area which is poorly delineated on today's motion limited examination, but estimated to measure approximately 3.6 x 2.1 x 4.2 cm (axial image  55 of series 22 and coronal image 51 of series 31), concerning for pancreatic neoplasm. This causes abrupt cut off of the distal pancreatic duct, as well as the distal common bile duct. No peripancreatic fluid collections or inflammatory changes. Spleen:  Unremarkable. Adrenals/Urinary Tract: Multiple small T1 hypointense, T2 hyperintense, nonenhancing lesions in both kidneys are compatible with tiny simple cysts. No hydroureteronephrosis in the visualized portions of the abdomen. Bilateral adrenal glands are normal in appearance. Stomach/Bowel: Visualized portions are unremarkable. Vascular/Lymphatic: No aneurysm identified in the visualized abdominal vasculature. No lymphadenopathy noted in the abdomen Other: No significant volume of ascites noted in the visualized portions of the peritoneal cavity. Musculoskeletal: No aggressive appearing osseous lesions are noted in the visualized portions of the skeleton. IMPRESSION: 1. 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 Further evaluation with endoscopic ultrasound is strongly recommended at this time. Electronically Signed   By: Vinnie Langton M.D.   On: 05/12/2021 07:05    Labs:  CBC: Recent Labs    05/11/21 2030 05/13/21 0241  WBC 6.6 6.3  HGB 11.5* 10.3*  HCT 34.7* 29.5*  PLT 307 254    COAGS: Recent Labs    05/12/21 0343  INR 0.9  APTT 27    BMP: Recent Labs    05/11/21 2030 05/13/21 0241  NA 134* 137  K 3.3* 3.6  CL 98 106  CO2 26 21*  GLUCOSE 181* 147*  BUN 19 16  CALCIUM 9.4 8.7*  CREATININE 1.21* 1.14*  GFRNONAA 47* 50*    LIVER FUNCTION TESTS: Recent Labs    05/11/21 2030 05/13/21 0241  BILITOT 8.7* 8.2*  AST 686* 392*  ALT 1,242* 856*  ALKPHOS 402* 336*  PROT 7.1 5.7*  ALBUMIN 3.6 2.9*    TUMOR MARKERS: No results for input(s): AFPTM, CEA, CA199, CHROMGRNA in the last 8760  hours.  Assessment and Plan: History significant for essential HTN and OSA on CPAP. Pt presented to ED 05/11/21 c/o jaundice, fatigue, abdominal pain, diarrhea, white stools and pruritis. Pt was found to have abnormal LFTs, hyperbilirubinemia, hypokalemia and lesion to the head of the pancreas obstructing the pancreatic and common bile duct. GI was consulted and attempted an ERCP but failed d/t inability to cannulate. Dr. Maryelizabeth Kaufmann was consulted to place percutaneous biliary drainage catheter with possible cholecystostomy.   MR Abd 05/11/21: IMPRESSION: 1. 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 Further evaluation with endoscopic ultrasound is strongly recommended at this time.  Pt resting in bed. She is A&O but drowsy from sedation  from earlier procedure. She is calm and pleasant. She is in no distress.  Dr. Maryelizabeth Kaufmann at bedside to discuss procedure. Pt does not have the capacity, d/t previous sedation, to make decision even though she expresses wanting to proceed. Pt request Dr. Maryelizabeth Kaufmann to call pt friend, Stevan Born to discuss procedure and requests Mrs. Sherlene Shams make decision on her behalf. After discussing risks and benefits, both pt and Mrs. Sherlene Shams agree to proceed.   Risks and benefits discussed with the patient and Stevan Born including, but not limited to bleeding, infection, gallbladder perforation, bile leak, sepsis or even death.  All of the patient's and Mrs. Blackburn's questions were answered, patient is agreeable to proceed.  Consent signed and in chart.   Thank you for this interesting consult.  I greatly enjoyed meeting Harlan Vinal and look forward to participating in their care.  A copy of this report was sent to the requesting provider on this date.  Electronically Signed: Tyson Alias, NP 05/13/2021, 9:44 AM   I spent a total of 20  minutes in face to face in clinical consultation, greater than 50% of which was counseling/coordinating care for percutaneous biliary drainage catheter, possible cholecystostomy.

## 2021-05-13 NOTE — Anesthesia Postprocedure Evaluation (Signed)
Anesthesia Post Note ? ?Patient: Tonya Jenkins ? ?Procedure(s) Performed: ENDOSCOPIC RETROGRADE CHOLANGIOPANCREATOGRAPHY (ERCP) ?UPPER ENDOSCOPIC ULTRASOUND (EUS) LINEAR ? ?  ? ?Patient location during evaluation: PACU ?Anesthesia Type: General ?Level of consciousness: awake and alert ?Pain management: pain level controlled ?Vital Signs Assessment: post-procedure vital signs reviewed and stable ?Respiratory status: spontaneous breathing, nonlabored ventilation, respiratory function stable and patient connected to nasal cannula oxygen ?Cardiovascular status: blood pressure returned to baseline and stable ?Postop Assessment: no apparent nausea or vomiting ?Anesthetic complications: no ? ? ?No notable events documented. ? ?Last Vitals:  ?Vitals:  ? 05/13/21 1030 05/13/21 1045  ?BP: (!) 151/73 (!) 161/83  ?Pulse: (!) 59 60  ?Resp: 10 10  ?Temp:  36.7 ?C  ?SpO2: 99% 99%  ?  ?Last Pain:  ?Vitals:  ? 05/13/21 1045  ?TempSrc:   ?PainSc: 0-No pain  ? ? ?  ?  ?  ?  ?  ?  ? ?Wister Hoefle L Mahayla Haddaway ? ? ? ? ?

## 2021-05-13 NOTE — Progress Notes (Signed)
Patient requested to be placed on CPAP 5 cmH2O via FFM; patient is tolerating at this time.  RT will continue to monitor.  ?

## 2021-05-13 NOTE — Progress Notes (Signed)
Tonya Jenkins was not comfortable with signing the consent form at this time. She would like to speak to the doctor first to understand the procedure better.   ?

## 2021-05-13 NOTE — Plan of Care (Signed)
Pt steady on her feet (transfers with stand-by assist and amb with assist). Pt stated sometimes RLE is numb and then she uses a cane or walker. Denied pain or SOB all shift. SBP above 165 and was given IV Labatolol 10 mg x1 which was effective. Pt refused to sign permit for ERCP with Biopsy this am because she still has questions for the doctor.  ?

## 2021-05-13 NOTE — Op Note (Signed)
[facility] ?Patient Name: Tonya Jenkins ?Procedure Date : 05/13/2021 ?MRN: [id number] ?Attending MD: Carol Ada , MD ?Date of Birth: Nov 09, 1945 ?CSN: [id number] ?Age: 76 ?Admit Type: Inpatient ?Procedure:                ERCP ?Indications:              Malignant stricture of the common bile duct ?Providers:                Carol Ada, MD, Dulcy Fanny, [facility],  ?                          Technician ?Referring MD:              ?Medicines:                General Anesthesia ?Complications:            Perforation ?Estimated Blood Loss:     Estimated blood loss was minimal. ?Procedure:                Pre-Anesthesia Assessment: ?                          - Prior to the procedure, a History and Physical  ?                          was performed, and patient medications and  ?                          allergies were reviewed. The patient's tolerance of  ?                          previous anesthesia was also reviewed. The risks  ?                          and benefits of the procedure and the sedation  ?                          options and risks were discussed with the patient.  ?                          All questions were answered, and informed consent  ?                          was obtained. Prior Anticoagulants: The patient has  ?                          taken no previous anticoagulant or antiplatelet  ?                          agents. ASA Grade Assessment: III - A patient with  ?                          severe systemic disease. After reviewing the risks  ?  and benefits, the patient was deemed in  ?                          satisfactory condition to undergo the procedure. ?                          - Sedation was administered by an anesthesia  ?                          professional. General anesthesia was attained. ?                          After obtaining informed consent, the scope was  ?                          passed under direct vision. Throughout the  ?                           procedure, the patient's blood pressure, pulse, and  ?                          oxygen saturations were monitored continuously. The  ?                          [id number]-[id number] ([id number]) Olympus duodenoscope was  ?                          introduced through the mouth, and used to inject  ?                          contrast into and used to inject contrast into the  ?                          ventral pancreatic duct. The ERCP was technically  ?                          difficult and complex. The patient tolerated the  ?                          procedure well. ?Scope In: ?Scope Out: ?Findings: ?     The major papilla was normal. A long 0.025 inch Jagwire was passed into  ?     the ventral pancreatic duct. ?     The EUS procedure was cancelled as there were multiple technical issues  ?     with the equipment. Mainly the screen was constantly flickering and  ?     turning into a blue screen. It was not safe to proceed with FNA. Also,  ?     only a limited view of the mass was possible. The procedure was  ?     converted to an ERCP. The ampulla was normal in appearance. Cannulation  ?     was attempted with the 0.035 wire, but no ducts were cannulated. The  ?     wire was than changed to a Revolution wire. With probing and tapping  ?  with the wire the guidewire was able to be advanced proximally and it  ?     appeared to be in the position of the CBD. There was mild resistance to  ?     the advancement of the wire. The sphincterotome was then advanced, but  ?     there was resistance. This resistance was concluded to be from the  ?     extrinsic compression of the mass. The sphinctotome was able to be  ?     advanced and it was easier. Contrast injection was performed and there  ?     was an irregular appearance. There was semblance of the CBD, but there  ?     was concern for perforation. Real time consultation with Radiology and  ?     another GI colleague was obtained. It was not clear if there was a   ?     perforation. With the sphincterotome in place fluid aspiration was  ?     obtained and only contrast and blood were obtained. The conclusion was  ?     that a small perforation occurred and the sphinctertome and wire were  ?     removed. With further imaging the contrast was noted to be outlining the  ?     duodenum externally. Becuase the perforation was small another attempt  ?     to cannulate the CBD was performed. The ventral PD was cannulated and  ?     then a two wire technique was employed. The further attempts did not  ?     yield cannulation of the CBD and the procedure was concluded. ?Impression:               - The major papilla appeared normal. ?Recommendation:           - Return patient to hospital ward for ongoing care. ?                          - NPO. ?                          - Continue Cipro and start metronidazole. ?                          - Consult IR for PTC. ?                          - Monitor clinical status. ?Procedure Code(s):        --- Professional --- ?                          [phone], Endoscopic retrograde  ?                          cholangiopancreatography (ERCP); diagnostic,  ?                          including collection of specimen(s) by brushing or  ?                          washing, when performed (separate procedure) ?Diagnosis Code(s):        --- Professional --- ?  K83.1, Obstruction of bile duct ?CPT copyright 2019 [employer]. All rights reserved. ?The codes documented in this report are preliminary and upon coder review may  ?be revised to meet current compliance requirements. ?Carol Ada, MD ?Carol Ada, MD ?05/13/2021 20:89:10 AM ?This report has been signed electronically. ?Number of Addenda: 0 ?

## 2021-05-13 NOTE — Progress Notes (Signed)
No overt signs or symptoms from the guidewire and sphincterotome perforation.  She denies any abdominal pain. ?

## 2021-05-14 ENCOUNTER — Encounter (HOSPITAL_COMMUNITY)
Admission: EM | Disposition: A | Discharge status: Home Health | Payer: Self-pay | Source: Home / Self Care | Attending: Internal Medicine

## 2021-05-14 ENCOUNTER — Inpatient Hospital Stay (HOSPITAL_COMMUNITY): Payer: Medicare Other

## 2021-05-14 ENCOUNTER — Encounter (HOSPITAL_COMMUNITY): Payer: Self-pay | Admitting: Family Medicine

## 2021-05-14 ENCOUNTER — Inpatient Hospital Stay (HOSPITAL_COMMUNITY): Payer: Medicare Other | Admitting: Certified Registered Nurse Anesthetist

## 2021-05-14 DIAGNOSIS — D72829 Elevated white blood cell count, unspecified: Secondary | ICD-10-CM

## 2021-05-14 DIAGNOSIS — G629 Polyneuropathy, unspecified: Secondary | ICD-10-CM

## 2021-05-14 DIAGNOSIS — K8689 Other specified diseases of pancreas: Secondary | ICD-10-CM

## 2021-05-14 DIAGNOSIS — R269 Unspecified abnormalities of gait and mobility: Secondary | ICD-10-CM

## 2021-05-14 DIAGNOSIS — I10 Essential (primary) hypertension: Secondary | ICD-10-CM

## 2021-05-14 DIAGNOSIS — R8290 Unspecified abnormal findings in urine: Secondary | ICD-10-CM | POA: Diagnosis not present

## 2021-05-14 DIAGNOSIS — Z8042 Family history of malignant neoplasm of prostate: Secondary | ICD-10-CM

## 2021-05-14 DIAGNOSIS — K838 Other specified diseases of biliary tract: Secondary | ICD-10-CM

## 2021-05-14 DIAGNOSIS — K831 Obstruction of bile duct: Secondary | ICD-10-CM | POA: Diagnosis not present

## 2021-05-14 DIAGNOSIS — Z79899 Other long term (current) drug therapy: Secondary | ICD-10-CM

## 2021-05-14 DIAGNOSIS — E46 Unspecified protein-calorie malnutrition: Secondary | ICD-10-CM

## 2021-05-14 DIAGNOSIS — L299 Pruritus, unspecified: Secondary | ICD-10-CM

## 2021-05-14 DIAGNOSIS — C259 Malignant neoplasm of pancreas, unspecified: Secondary | ICD-10-CM

## 2021-05-14 DIAGNOSIS — R748 Abnormal levels of other serum enzymes: Secondary | ICD-10-CM | POA: Diagnosis not present

## 2021-05-14 DIAGNOSIS — D649 Anemia, unspecified: Secondary | ICD-10-CM | POA: Diagnosis not present

## 2021-05-14 HISTORY — PX: FINE NEEDLE ASPIRATION: SHX5430

## 2021-05-14 HISTORY — PX: ESOPHAGOGASTRODUODENOSCOPY (EGD) WITH PROPOFOL: SHX5813

## 2021-05-14 HISTORY — PX: EUS: SHX5427

## 2021-05-14 LAB — CBC
HCT: 32.8 % — ABNORMAL LOW (ref 36.0–46.0)
Hemoglobin: 11.2 g/dL — ABNORMAL LOW (ref 12.0–15.0)
MCH: 27.8 pg (ref 26.0–34.0)
MCHC: 34.1 g/dL (ref 30.0–36.0)
MCV: 81.4 fL (ref 80.0–100.0)
Platelets: 267 10*3/uL (ref 150–400)
RBC: 4.03 MIL/uL (ref 3.87–5.11)
RDW: 17.9 % — ABNORMAL HIGH (ref 11.5–15.5)
WBC: 13.3 10*3/uL — ABNORMAL HIGH (ref 4.0–10.5)
nRBC: 0 % (ref 0.0–0.2)

## 2021-05-14 LAB — COMPREHENSIVE METABOLIC PANEL
ALT: 915 U/L — ABNORMAL HIGH (ref 0–44)
AST: 420 U/L — ABNORMAL HIGH (ref 15–41)
Albumin: 2.7 g/dL — ABNORMAL LOW (ref 3.5–5.0)
Alkaline Phosphatase: 340 U/L — ABNORMAL HIGH (ref 38–126)
Anion gap: 9 (ref 5–15)
BUN: 16 mg/dL (ref 8–23)
CO2: 24 mmol/L (ref 22–32)
Calcium: 8.3 mg/dL — ABNORMAL LOW (ref 8.9–10.3)
Chloride: 101 mmol/L (ref 98–111)
Creatinine, Ser: 1.07 mg/dL — ABNORMAL HIGH (ref 0.44–1.00)
GFR, Estimated: 54 mL/min — ABNORMAL LOW (ref >60)
Glucose, Bld: 219 mg/dL — ABNORMAL HIGH (ref 70–99)
Potassium: 4.4 mmol/L (ref 3.5–5.1)
Sodium: 134 mmol/L — ABNORMAL LOW (ref 135–145)
Total Bilirubin: 5.1 mg/dL — ABNORMAL HIGH (ref 0.3–1.2)
Total Protein: 5.6 g/dL — ABNORMAL LOW (ref 6.5–8.1)

## 2021-05-14 LAB — GLUCOSE, CAPILLARY
Glucose-Capillary: 230 mg/dL — ABNORMAL HIGH (ref 70–99)
Glucose-Capillary: 188 mg/dL — ABNORMAL HIGH (ref 70–99)
Glucose-Capillary: 187 mg/dL — ABNORMAL HIGH (ref 70–99)
Glucose-Capillary: 170 mg/dL — ABNORMAL HIGH (ref 70–99)

## 2021-05-14 IMAGING — CT CT CHEST W/O CM
2 of 4 series · 15 of 36 positions shown, 18 images · non-contrast
Comparison: None.

CLINICAL DATA: Newly diagnosed pancreatic carcinoma.  Staging.

* onc *
EXAM:
CT CHEST WITHOUT CONTRAST
TECHNIQUE: Multidetector CT imaging of the chest was performed following the
standard protocol without IV contrast.
RADIATION DOSE REDUCTION: This exam was performed according to the
departmental dose-optimization program which includes automated
exposure control, adjustment of the mA and/or kV according to
patient size and/or use of iterative reconstruction technique.

[Series 4: thorax 2.0 · axial · 0.80mm/px · z∈[-222,+82]mm · 12 of 170 slices shown, 15 images]
[im 9/170  mediastinal]
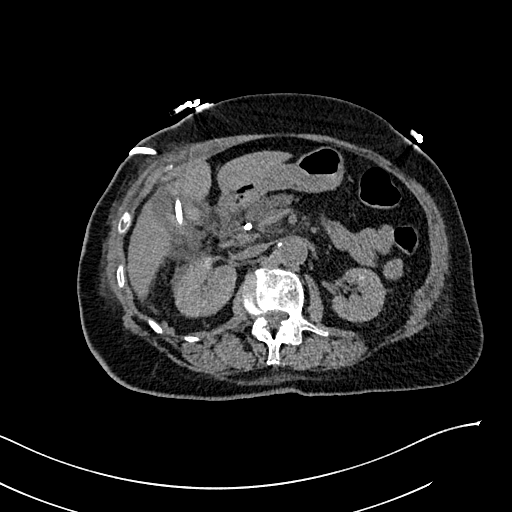
[im 9/170  lung]
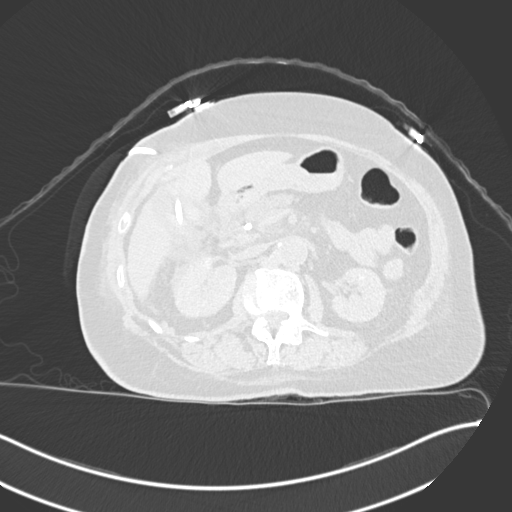
[im 27/170  lung]
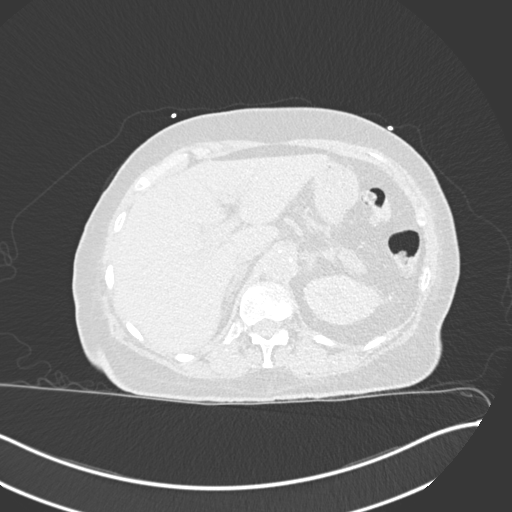
[im 36/170  lung]
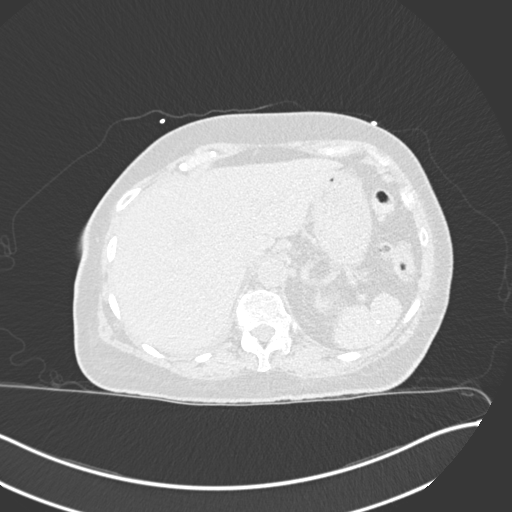
[im 54/170  lung]
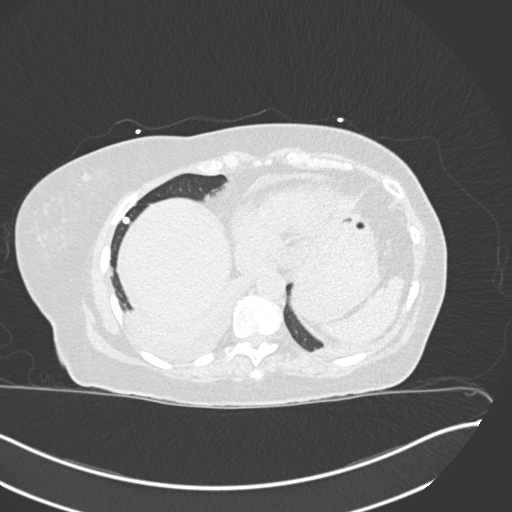
[im 63/170  mediastinal]
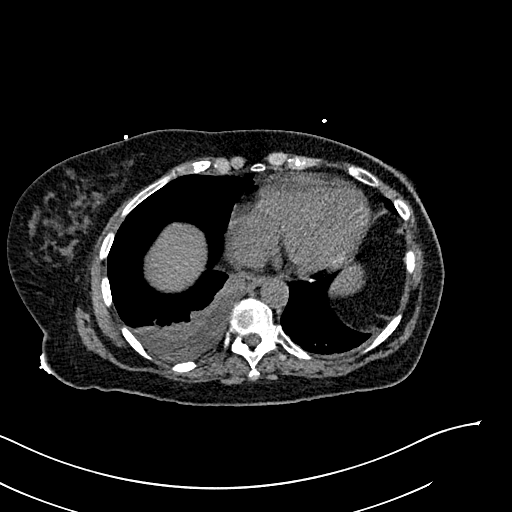
[im 63/170  lung]
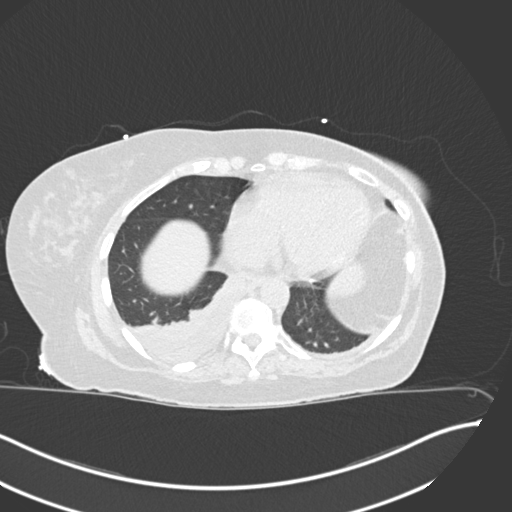
[im 81/170  lung]
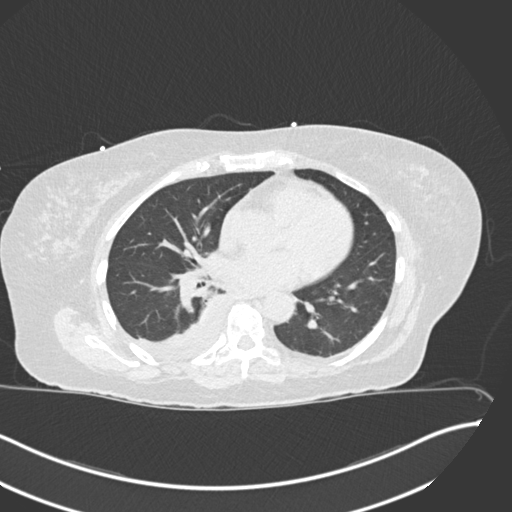
[im 89/170  lung]
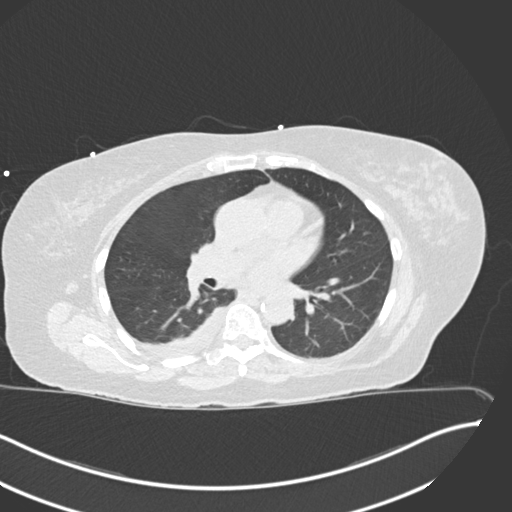
[im 107/170  lung]
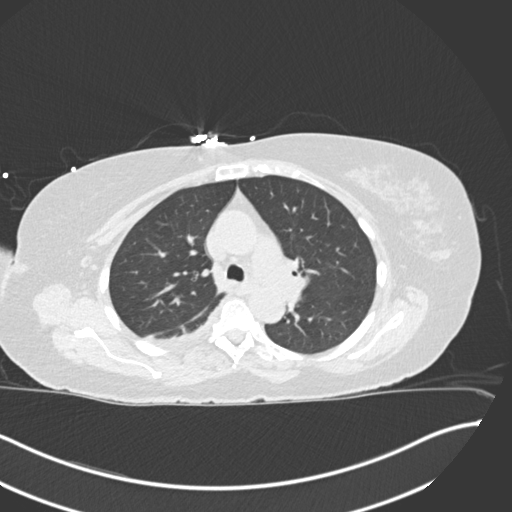
[im 116/170  mediastinal]
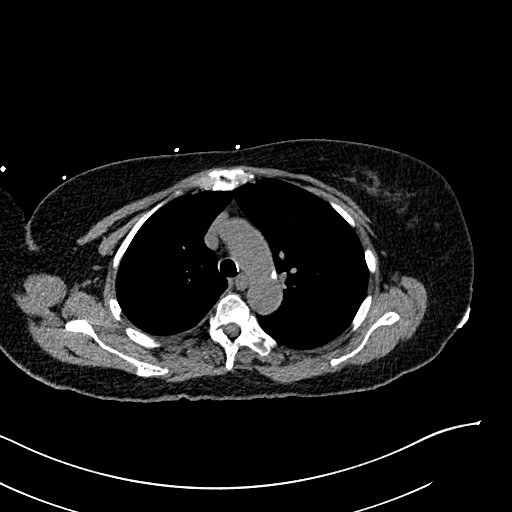
[im 116/170  lung]
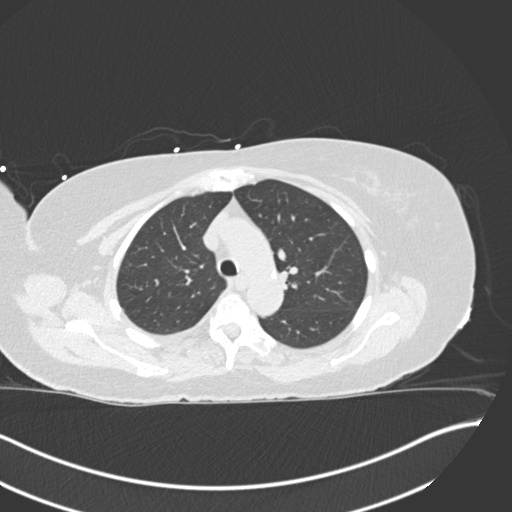
[im 134/170  lung]
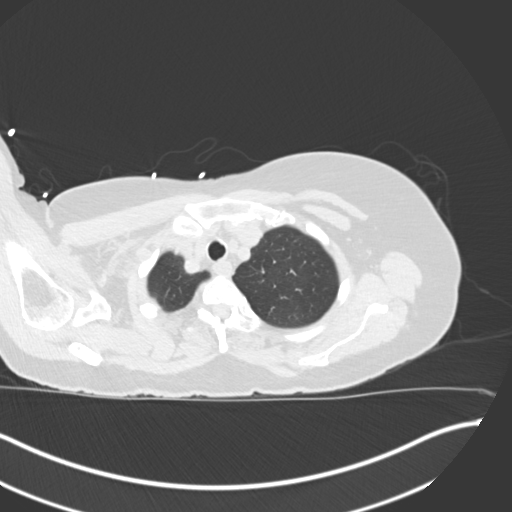
[im 143/170  lung]
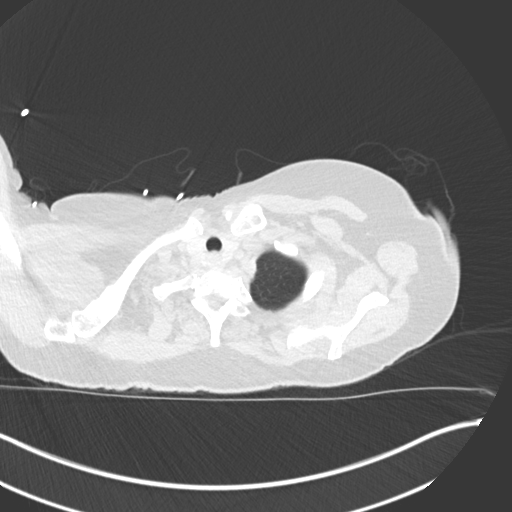
[im 161/170  lung]
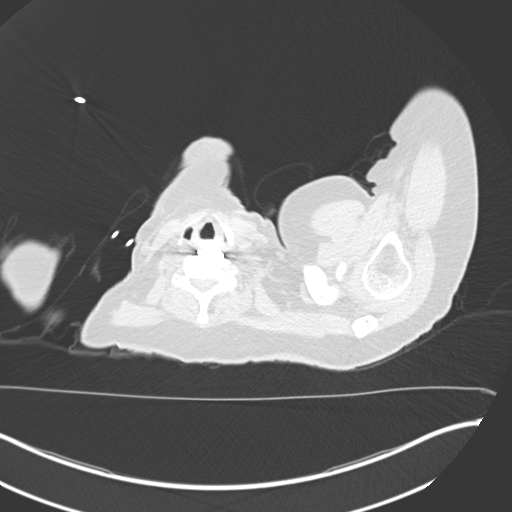

[Series 6: coronal · coronal · 0.73mm/px · 3 of 101 slices shown]
[im 21/101  lung]
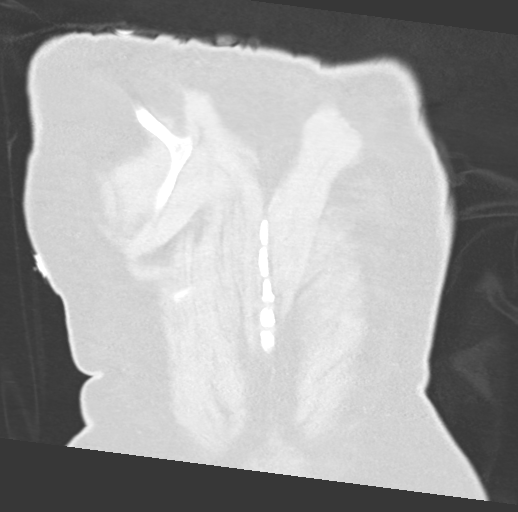
[im 41/101  lung]
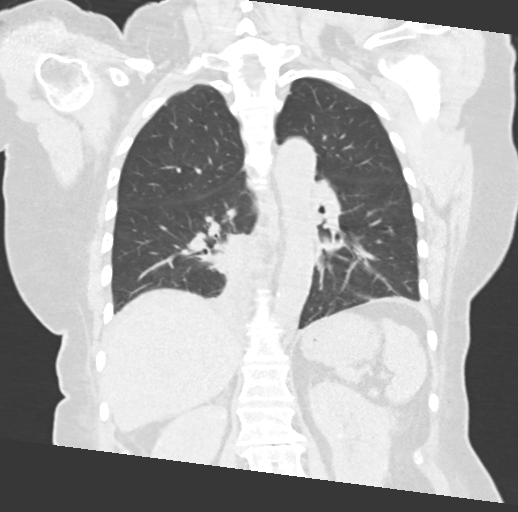
[im 61/101  lung]
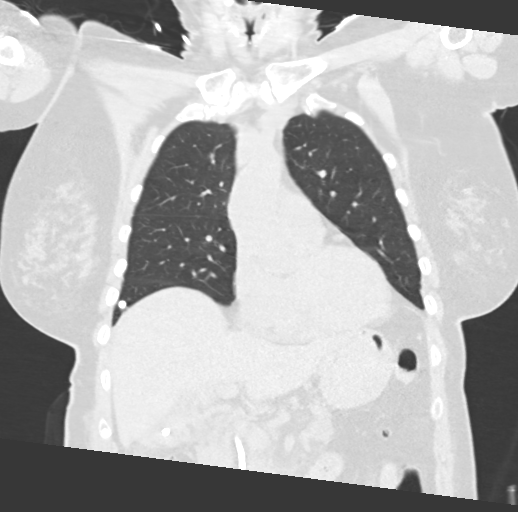

[15 of 36 positions shown; findings below may reference images not displayed]

FINDINGS: Cardiovascular: No acute findings. Aortic atherosclerotic
calcification noted.

Mediastinum/Nodes: No masses or pathologically enlarged lymph nodes
identified on this unenhanced exam.

Lungs/Pleura: Small right pleural effusion is seen with compressive
atelectasis in right lower lobe. No suspicious pulmonary nodules or
masses are identified. No evidence of central endobronchial
obstruction. Right hilar lymph node calcification and right lower
lobe calcified granuloma, consistent with old granulomatous disease.
Mild pleural-parenchymal scarring in lateral left lower lobe.

Upper Abdomen:  Internal external biliary drain noted.

Musculoskeletal:  No suspicious bone lesions.
IMPRESSION: No evidence of metastatic disease within the thorax.

Small right pleural effusion and right lower lobe compressive
atelectasis.

Aortic Atherosclerosis ([S5]-[S5]).

## 2021-05-14 SURGERY — ESOPHAGOGASTRODUODENOSCOPY (EGD) WITH PROPOFOL
Anesthesia: Monitor Anesthesia Care

## 2021-05-14 MED ORDER — ADULT MULTIVITAMIN W/MINERALS CH
1.0000 | ORAL_TABLET | Freq: Every day | ORAL | Status: DC
  Administered 2021-05-15 – 2021-05-17 (×3): 1 via ORAL
  Filled 2021-05-14 (×3): qty 1

## 2021-05-14 MED ORDER — LACTATED RINGERS IV SOLN
INTRAVENOUS | Status: AC | PRN
  Administered 2021-05-14: 1000 mL via INTRAVENOUS

## 2021-05-14 MED ORDER — SODIUM CHLORIDE 0.9% FLUSH
10.0000 mL | Freq: Two times a day (BID) | INTRAVENOUS | Status: DC
  Administered 2021-05-15 – 2021-05-16 (×3): 10 mL

## 2021-05-14 MED ORDER — INSULIN ASPART 100 UNIT/ML IJ SOLN
0.0000 [IU] | Freq: Three times a day (TID) | INTRAMUSCULAR | Status: DC
  Administered 2021-05-14 – 2021-05-15 (×2): 2 [IU] via SUBCUTANEOUS
  Administered 2021-05-16 – 2021-05-17 (×2): 1 [IU] via SUBCUTANEOUS
  Administered 2021-05-17: 2 [IU] via SUBCUTANEOUS

## 2021-05-14 MED ORDER — KATE FARMS STANDARD 1.4 PO LIQD
325.0000 mL | Freq: Two times a day (BID) | ORAL | Status: DC
  Administered 2021-05-14: 325 mL via ORAL
  Filled 2021-05-14 (×7): qty 325

## 2021-05-14 MED ORDER — PROPOFOL 10 MG/ML IV BOLUS
INTRAVENOUS | Status: DC | PRN
Start: 2021-05-14 — End: 2021-05-14
  Administered 2021-05-14 (×4): 10 mg via INTRAVENOUS

## 2021-05-14 MED ORDER — SODIUM CHLORIDE 0.9 % IV SOLN: INTRAVENOUS | Status: DC

## 2021-05-14 MED ORDER — PROPOFOL 500 MG/50ML IV EMUL
INTRAVENOUS | Status: DC | PRN
  Administered 2021-05-14: 150 ug/kg/min via INTRAVENOUS
  Administered 2021-05-14: 100 ug/kg/min via INTRAVENOUS

## 2021-05-14 MED ORDER — KATE FARMS STANDARD 1.4 PO LIQD: 325.0000 mL | Freq: Two times a day (BID) | ORAL | Status: DC

## 2021-05-14 SURGICAL SUPPLY — 15 items

## 2021-05-14 NOTE — Evaluation (Signed)
Physical Therapy Evaluation ?Patient Details ?Name: Tonya Jenkins ?MRN: [id number] ?DOB: 18-Dec-1945 ?Today's Date: 05/14/2021 ? ?History of Present Illness ? 76 y/o female presented to ED on 05/11/21 for jaundice. Found to have significant elevated LFTs, concern for pancreatic malignancy. 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 PMH: HTN, OSA on CPAP  ?Clinical Impression ? Patient admitted with above findings. Patient presents with generalized weakness, impaired balance, decreased activity tolerance, and pain. Patient requires min guard for ambulation with use of cane on L and HHAx1 on R, no overt LOB noted. Patient limited by pain at this time but anticipate patient will progress quickly once pain is managed. PTA, patient lives alone with her dog and was using rollator and cane interchangeably and will have friends available to assist as needed. Patient will benefit from skilled PT services during acute stay to address listed deficits. No PT follow up recommended at this time.    ?   ? ?Recommendations for follow up therapy are one component of a multi-disciplinary discharge planning process, led by the attending physician.  Recommendations may be updated based on patient status, additional functional criteria and insurance authorization. ? ?Follow Up Recommendations No PT follow up ? ?  ?Assistance Recommended at Discharge Set up Supervision/Assistance  ?Patient can return home with the following ?   ? ?  ?Equipment Recommendations None recommended by PT  ?Recommendations for Other Services ?    ?  ?Functional Status Assessment Patient has had a recent decline in their functional status and demonstrates the ability to make significant improvements in function in a reasonable and predictable amount of time.  ? ?  ?Precautions / Restrictions Precautions ?Precautions: Fall ?Precaution Comments: drain on R side ?Restrictions ?Weight Bearing Restrictions:  No  ? ?  ? ?Mobility ? Bed Mobility ?Overal bed mobility: Needs Assistance ?Bed Mobility: Rolling, Sidelying to Sit, Sit to Sidelying ?Rolling: Min guard ?Sidelying to sit: Min guard ?  ?  ?Sit to sidelying: Min guard ?General bed mobility comments: instructed patient on log roll technique for comfort to avoid stress on incision. Min guard for safety. Use of bed rails to complete ?  ? ?Transfers ?Overall transfer level: Needs assistance ?Equipment used: Straight cane ?Transfers: Sit to/from Stand ?Sit to Stand: Min guard ?  ?  ?  ?  ?  ?General transfer comment: min guard for safety ?  ? ?Ambulation/Gait ?Ambulation/Gait assistance: Min guard ?Gait Distance (Feet): 75 Feet ?Assistive device: Straight cane, 1 person hand held assist ?Gait Pattern/deviations: Step-through pattern, Decreased stride length ?Gait velocity: decreased ?  ?  ?General Gait Details: use of cane on L side and HHAx1 on R. min guard for safety. Slow steady gait. No overt LOB noted ? ?Stairs ?  ?  ?  ?  ?  ? ?Wheelchair Mobility ?  ? ?Modified Rankin (Stroke Patients Only) ?  ? ?  ? ?Balance Overall balance assessment: Mild deficits observed, not formally tested ?  ?  ?  ?  ?  ?  ?  ?  ?  ?  ?  ?  ?  ?  ?  ?  ?  ?  ?   ? ? ? ?Pertinent Vitals/Pain Pain Assessment ?Pain Assessment: Faces ?Faces Pain Scale: Hurts even more ?Pain Location: R abdomen ?Pain Descriptors / Indicators: Discomfort, Grimacing, Guarding ?Pain Intervention(s): Monitored during session, Repositioned  ? ? ?Home Living Family/patient expects to be discharged to:: Private residence ?  Living Arrangements: Alone ?Available Help at Discharge: Friend(s);Available PRN/intermittently ?Type of Home: House ?Home Access: Stairs to enter ?Entrance Stairs-Rails: Right;Left;Can reach both ?Entrance Stairs-Number of Steps: 5 ?  ?Home Layout: One level ?Home Equipment: Rollator (4 wheels);Cane - single point ?   ?  ?Prior Function Prior Level of Function : Independent/Modified Independent ?  ?   ?  ?  ?  ?  ?Mobility Comments: ambulates with rollator and SPC interchangeably dependent on where she goes ?  ?  ? ? ?Hand Dominance  ?   ? ?  ?Extremity/Trunk Assessment  ? Upper Extremity Assessment ?Upper Extremity Assessment: Generalized weakness ?  ? ?Lower Extremity Assessment ?Lower Extremity Assessment: Generalized weakness ?  ? ?Cervical / Trunk Assessment ?Cervical / Trunk Assessment: Normal  ?Communication  ? Communication: No difficulties  ?Cognition Arousal/Alertness: Awake/alert ?Behavior During Therapy: [facility] for tasks assessed/performed ?Overall Cognitive Status: Within Functional Limits for tasks assessed ?  ?  ?  ?  ?  ?  ?  ?  ?  ?  ?  ?  ?  ?  ?  ?  ?  ?  ?  ? ?  ?General Comments General comments (skin integrity, edema, etc.): VSS on RA ? ?  ?Exercises    ? ?Assessment/Plan  ?  ?PT Assessment Patient needs continued PT services  ?PT Problem List Decreased strength;Decreased activity tolerance;Decreased balance;Decreased mobility;Cardiopulmonary status limiting activity;Pain ? ?   ?  ?PT Treatment Interventions DME instruction;Gait training;Stair training;Functional mobility training;Therapeutic activities;Therapeutic exercise;Balance training;Patient/family education   ? ?PT Goals (Current goals can be found in the Care Plan section)  ?Acute Rehab PT Goals ?Patient Stated Goal: to go home tomorrow ?PT Goal Formulation: With patient ?Time For Goal Achievement: 05/28/21 ?Potential to Achieve Goals: Good ? ?  ?Frequency Min 3X/week ?  ? ? ?Co-evaluation   ?  ?  ?  ?  ? ? ?  ?AM-PAC PT "6 Clicks" Mobility  ?Outcome Measure Help needed turning from your back to your side while in a flat bed without using bedrails?: A Little ?Help needed moving from lying on your back to sitting on the side of a flat bed without using bedrails?: A Little ?Help needed moving to and from a bed to a chair (including a wheelchair)?: A Little ?Help needed standing up from a chair using your arms (e.g., wheelchair or  bedside chair)?: A Little ?Help needed to walk in hospital room?: A Little ?Help needed climbing 3-5 steps with a railing? : A Little ?6 Click Score: 18 ? ?  ?End of Session Equipment Utilized During Treatment: Gait belt ?Activity Tolerance: Patient tolerated treatment well ?Patient left: in bed;with call bell/phone within reach;with family/visitor present ?Nurse Communication: Mobility status ?PT Visit Diagnosis: Unsteadiness on feet (R26.81);Muscle weakness (generalized) (M62.81) ?  ? ?Time: 7517-0017 ?PT Time Calculation (min) (ACUTE ONLY): 24 min ? ? ?Charges:   PT Evaluation ?$PT Eval Moderate Complexity: 1 Mod ?PT Treatments ?$Therapeutic Activity: 8-22 mins ?  ?   ? ? ?Ticara Waner A. Gilford Rile, PT, DPT ?Acute Rehabilitation Services ?Pager [phone] ?Office [phone] ? ? ?Ermel Verne A Jahnyla Parrillo ?05/14/2021, 2:50 PM ? ?

## 2021-05-14 NOTE — Anesthesia Postprocedure Evaluation (Signed)
Anesthesia Post Note ? ?Patient: Tonya Jenkins ? ?Procedure(s) Performed: ESOPHAGOGASTRODUODENOSCOPY (EGD) WITH PROPOFOL ?UPPER ENDOSCOPIC ULTRASOUND (EUS) LINEAR (Left) ?FINE NEEDLE ASPIRATION (FNA) LINEAR ? ?  ? ?Patient location during evaluation: Endoscopy ?Anesthesia Type: MAC ?Level of consciousness: awake and alert ?Pain management: pain level controlled ?Vital Signs Assessment: post-procedure vital signs reviewed and stable ?Respiratory status: spontaneous breathing, nonlabored ventilation and respiratory function stable ?Cardiovascular status: blood pressure returned to baseline and stable ?Postop Assessment: no apparent nausea or vomiting ?Anesthetic complications: no ? ? ?No notable events documented. ? ?Last Vitals:  ?Vitals:  ? 05/14/21 1128 05/14/21 1132  ?BP: 138/82 (!) 121/96  ?Pulse: 65 68  ?Resp: 19 18  ?Temp:    ?SpO2: 92% 95%  ?  ?Last Pain:  ?Vitals:  ? 05/14/21 1010  ?TempSrc:   ?PainSc: 0-No pain  ? ? ?  ?  ?  ?  ?  ?  ? ?Lidia Collum ? ? ? ? ?

## 2021-05-14 NOTE — Progress Notes (Addendum)
Inpatient Diabetes Program Recommendations ? ?AACE/ADA: New Consensus Statement on Inpatient Glycemic Control (2015) ? ?Target Ranges:  Prepandial:   less than 140 mg/dL ?     Peak postprandial:   less than 180 mg/dL (1-2 hours) ?     Critically ill patients:  140 - 180 mg/dL  ? ? Latest Reference Range & Units 05/13/21 23:11 05/14/21 03:25 05/14/21 07:25  ?Glucose-Capillary 70 - 99 mg/dL 191 (H) 230 (H) 188 (H)  ?(H): Data is abnormally high ? Latest Reference Range & Units 05/11/21 20:30  ?Hemoglobin A1C 4.8 - 5.6 % 6.5 (H)  ?(H): Data is abnormally high ? ? ?Admit with: Obstructive jaundice secondary to pancreatic mass ? ?History: DM2 ? ? ?Checking CBGs Q4 hours ? ? ? ? ?MD- Note orders placed for CBG checks Q4 hours ? ?Please consider adding Novolog Sensitive Correction Scale/ SSI (0-9 units) Q4 hours ? ?Per review of Chart, looks like pt was diagnosed with diabetes back in April 2022 (see PCP notes from 06/10/2020--[facility]) ? ? ?Addendum 11:30am--Met w/ pt this AM.  Confirmed with pt that she does indeed carry a previous diagnosis of diabetes.  States she had pre-diabetes for a long time and her PCP diagnosed her this past year.  Manages CBG at home with diet and exercise.  Explained to pt that we are checking CBGs Q4H in the hospital and that I have requested the MD add Novolog SSi to her hospital regimen to help with glucose elevations--explained to pt that pain and stress will cause hyperglycemia even if pt not eating.  Pt agreeable and open to taking insulin in the hospital if needed ? ? ? ? ? ?--Will follow patient during hospitalization-- ? ?Wyn Quaker RN, MSN, CDE ?Diabetes Coordinator ?Inpatient Glycemic Control Team ?Team Pager: [phone] (8a-5p) ? ? ? ? ? ? ? ? ? ? ?

## 2021-05-14 NOTE — Progress Notes (Addendum)
Referring Physician(s): Dr. Benson Norway  Supervising Physician: Michaelle Birks  Patient Status:  [facility] - In-pt  Chief Complaint: Pancreatic mass  Subjective: Resting comfortably in bed.  Friend at bedside as well as Dr. Burr Medico. Drain in place.  Insertion site intact.  Drainage bag with bilious output.   Allergies: Amlodipine besylate, Hydrochlorothiazide, Lexapro [escitalopram oxalate], Oxycodone, and Penicillins  Medications: Prior to Admission medications   Medication Sig Start Date End Date Taking? Authorizing Provider  B Complex Vitamins (VITAMIN B COMPLEX PO) Take 1 capsule by mouth daily.   Yes [provider]  diltiazem (DILACOR XR) 180 MG 24 hr capsule Take 180 mg by mouth at bedtime. 04/27/21  Yes [provider]  melatonin 3 MG TABS tablet Take 3 mg by mouth at bedtime.   Yes [provider]  NON FORMULARY CPAP at bedtime   Yes [provider]  olmesartan (BENICAR) 40 MG tablet Take 40 mg by mouth at bedtime. 03/17/20  Yes [provider]  triamcinolone lotion (KENALOG) 0.1 % Apply 0.1 application. topically See admin instructions. Bid x 7 days 05/06/21  Yes [provider]  hydrOXYzine (ATARAX) 25 MG tablet Take 25 mg by mouth every 8 (eight) hours as needed for anxiety or itching. 05/06/21   [provider]     Vital Signs: BP 137/64 (BP Location: Right Arm)    Pulse 68    Temp 99.2 F (37.3 C) (Oral)    Resp 18    Ht 5' (1.524 m)    Wt 148 lb 2.4 oz (67.2 kg)    SpO2 93%    BMI 28.93 kg/m   Physical Exam NAD, alert ABdomen: soft, tender.  Drain insertion site I/c/d.  Bilious drainage in gravity bag.    Imaging: US Abdomen Complete  Result Date: 05/11/2021 CLINICAL DATA:  Left upper quadrant tenderness. EXAM: ABDOMEN ULTRASOUND COMPLETE COMPARISON:  CT 07/21/2020 FINDINGS: Gallbladder: Distended. No gallstones or wall thickening visualized. No sonographic Murphy sign noted by sonographer. Common bile duct:  Diameter: 7 mm proximally, flares to 10 mm distally Liver: No focal lesion identified. Increased and heterogeneous in parenchymal echogenicity. No capsular nodularity. Portal vein is patent on color Doppler imaging with normal direction of blood flow towards the liver. IVC: No abnormality visualized. Pancreas: Dilated pancreatic duct at 10 mm. No pancreatic mass is seen. Spleen: Normal in size. No focal sonographic abnormality. Calcified granuloma on CT are not seen. Right Kidney: Length: 9.8 cm. Suggestion of mild hydronephrosis. No visualized renal calculi. Lower pole cyst on prior CT is not seen by ultrasound. Left Kidney: Length: 9.5 cm. No hydronephrosis. No visualized renal calculi. No focal renal abnormality. Abdominal aorta: No aneurysm visualized. Other findings: No abdominal ascites. IMPRESSION: 1. Dilatation of the common bile duct and pancreatic ducts. This can be seen in the setting of pancreatic head mass, although no focal pancreatic lesion is seen by ultrasound. Recommend further assessment with created protocol MRI, or contrast-enhanced abdominopelvic CT if patient cannot tolerate breath hold technique. 2. Mild right hydronephrosis, of unknown etiology. 3. Hepatic steatosis. Electronically Signed   By: Keith Rake M.D.   On: 05/11/2021 21:25   CT ABDOMEN PELVIS W CONTRAST  Result Date: 05/12/2021 CLINICAL DATA:  Abdominal pain. EXAM: CT ABDOMEN AND PELVIS WITH CONTRAST TECHNIQUE: Multidetector CT imaging of the abdomen and pelvis was performed using the standard protocol following bolus administration of intravenous contrast. RADIATION DOSE REDUCTION: This exam was performed according to the departmental dose-optimization program which includes automated  exposure control, adjustment of the mA and/or kV according to patient size and/or use of iterative reconstruction technique. CONTRAST:  4m OMNIPAQUE IOHEXOL 300 MG/ML  SOLN COMPARISON:  Jul 21, 2020 FINDINGS: Lower chest: No acute  abnormality. Hepatobiliary: No focal liver abnormality is seen. Marked severity intrahepatic biliary dilatation is noted. The gallbladder is markedly distended without evidence of gallstones are gallbladder wall thickening. The common bile duct is dilated and measures approximately 13 mm in diameter. Abrupt narrowing of the common bile duct is seen at the level of the pancreatic head. (Axial CT images 28 through 31, CT series 3. This represents a new finding when compared to the prior study. Pancreas: Pancreatic duct is also dilated (12 mm in diameter) with abrupt narrowing seen within the pancreatic head (axial CT images 30 through 32, CT series 3). This represents a new finding when compared to the prior exam. Mild prominence of the pancreatic head is noted without an underlying mass lesion. No peripancreatic inflammatory fat stranding is seen. Spleen: Normal in size without focal abnormality. Adrenals/Urinary Tract: Adrenal glands are unremarkable. Kidneys are normal in size, without renal calculi or hydronephrosis. Small bilateral simple renal cysts are noted. Bladder is unremarkable. Stomach/Bowel: Stomach is within normal limits. Appendix appears normal. No evidence of bowel wall thickening, distention, or inflammatory changes. Noninflamed diverticula are seen throughout the large bowel. Vascular/Lymphatic: Aortic atherosclerosis. No enlarged abdominal or pelvic lymph nodes. Reproductive: Uterus and bilateral adnexa are unremarkable. Other: There is a 2.1 cm 1.0 cm fat containing umbilical hernia. No abdominopelvic ascites. Musculoskeletal: Multilevel degenerative changes are noted throughout the lumbar spine. IMPRESSION: 1. Marked severity intrahepatic and extrahepatic biliary dilatation with an abrupt narrowing of the common bile duct and pancreatic duct at the level of the pancreatic head. Further correlation with MRCP is recommended. 2. Markedly distended gallbladder without evidence of cholelithiasis or  acute cholecystitis. 3. Colonic diverticulosis. 4. Small bilateral simple renal cysts. 5. 2.1 cm fat containing umbilical hernia. 6. Aortic atherosclerosis. Aortic Atherosclerosis (ICD10-I70.0). Electronically Signed   By: TVirgina NorfolkM.D.   On: 05/12/2021 01:23   DG ERCP  Result Date: 05/13/2021 CLINICAL DATA:  Biliary obstruction. EXAM: ERCP COMPARISON:  CT AP, 05/12/2021 and 07/21/2020.  MRCP, 05/12/2021. FLUOROSCOPY: Exposure Index (as provided by the fluoroscopic device): 37 mGy Kerma FINDINGS: Limited oblique planar images of the RIGHT upper quadrant obtained C-arm. Images demonstrating flexible endoscopy, attempted biliary duct cannulation and retrograde cholangiogram. IMPRESSION: Fluoroscopic imaging for ERCP. No discrete retrograde biliary opacification For complete description of intra procedural findings, please see performing service dictation. Electronically Signed   By: JMichaelle BirksM.D.   On: 05/13/2021 09:40   MR ABDOMEN MRCP W WO CONTAST  Result Date: 05/12/2021 CLINICAL DATA:  76year old female with history of jaundice and abdominal pain. Biliary obstruction noted on recent CT examination. Follow-up study. EXAM: MRI ABDOMEN WITHOUT AND WITH CONTRAST (INCLUDING MRCP) TECHNIQUE: Multiplanar multisequence MR imaging of the abdomen was performed both before and after the administration of intravenous contrast. Heavily T2-weighted images of the biliary and pancreatic ducts were obtained, and three-dimensional MRCP images were rendered by post processing. CONTRAST:  765mGADAVIST GADOBUTROL 1 MMOL/ML IV SOLN COMPARISON:  No prior abdominal MRI. CT of the abdomen and pelvis 05/12/2021. FINDINGS: Comment: Portions of today's examination are limited by considerable patient respiratory motion. Lower chest: Unremarkable. Hepatobiliary: Within the limitations of today's examination, there are no definite suspicious cystic or solid hepatic lesions. Severe intra and extrahepatic biliary ductal  dilatation is noted. Gallbladder  is moderately distended. Gallbladder wall thickness is normal. No filling defects to suggest gallstones. No definite pericholecystic fluid. No filling defects within the common bile duct to suggest choledocholithiasis. Common bile duct measures up to 14 mm in the porta hepatis. There is abrupt cut off of the distal common bile duct in the region of the pancreatic head. Pancreas: MRCP images demonstrate severe diffuse pancreatic ductal dilatation measuring up to 8 mm in the pancreatic body. In the pancreatic head there is a enhancing mass-like area which is poorly delineated on today's motion limited examination, but estimated to measure approximately 3.6 x 2.1 x 4.2 cm (axial image 55 of series 22 and coronal image 51 of series 31), concerning for pancreatic neoplasm. This causes abrupt cut off of the distal pancreatic duct, as well as the distal common bile duct. No peripancreatic fluid collections or inflammatory changes. Spleen:  Unremarkable. Adrenals/Urinary Tract: Multiple small T1 hypointense, T2 hyperintense, nonenhancing lesions in both kidneys are compatible with tiny simple cysts. No hydroureteronephrosis in the visualized portions of the abdomen. Bilateral adrenal glands are normal in appearance. Stomach/Bowel: Visualized portions are unremarkable. Vascular/Lymphatic: No aneurysm identified in the visualized abdominal vasculature. No lymphadenopathy noted in the abdomen Other: No significant volume of ascites noted in the visualized portions of the peritoneal cavity. Musculoskeletal: No aggressive appearing osseous lesions are noted in the visualized portions of the skeleton. IMPRESSION: 1. 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 Further evaluation with endoscopic ultrasound is strongly recommended at this time. Electronically Signed   By: Vinnie Langton M.D.   On: 05/12/2021 07:05    Labs:  CBC: Recent Labs    05/11/21 2030 05/13/21 0241 05/14/21 0159  WBC 6.6 6.3 13.3*  HGB 11.5* 10.3* 11.2*  HCT 34.7* 29.5* 32.8*  PLT 307 254 267    COAGS: Recent Labs    05/12/21 0343  INR 0.9  APTT 27    BMP: Recent Labs    05/11/21 2030 05/13/21 0241 05/14/21 0159  NA 134* 137 134*  K 3.3* 3.6 4.4  CL 98 106 101  CO2 26 21* 24  GLUCOSE 181* 147* 219*  BUN '19 16 16  '$ CALCIUM 9.4 8.7* 8.3*  CREATININE 1.21* 1.14* 1.07*  GFRNONAA 47* 50* 54*    LIVER FUNCTION TESTS: Recent Labs    05/11/21 2030 05/13/21 0241 05/14/21 0159  BILITOT 8.7* 8.2* 5.1*  AST 686* 392* 420*  ALT 1,242* 856* 915*  ALKPHOS 402* 336* 340*  PROT 7.1 5.7* 5.6*  ALBUMIN 3.6 2.9* 2.7*    Assessment and Plan: Pancreatic mass Biliary obstruction s/p internal/external biliary drain placement 05/13/21 by Dr. Maryelizabeth Kaufmann. Patient with improvement today.  Afebrile.  WBC 13.9 Tbili down to 5.4 On IV Flagyl and Cipro.  Repeat EGD/EUS performed today by Dr. Benson Norway for biopsies.  Patient states she may be going home soon.  Drain will be left to gravity at this time.  Plan to return for drain exchange in 2 months per Dr. Maryelizabeth Kaufmann.  Order placed for schedulers to arrange.  If patient stays inpatient with further decompression she could potentially initiate capping trial, but will hold on this as appears she may be discharging soon.  Recommend lab work per PCP or GI within the next week to monitor electrolytes and trend hepatic function.  Discussed drain care at home.     Electronically Signed: Docia Barrier,  PA 05/14/2021, 5:05 PM   I spent a total of 15 Minutes at the the patient's bedside AND on the patient's hospital floor or unit, greater than 50% of which was counseling/coordinating care for pancreatic mass.

## 2021-05-14 NOTE — Op Note (Addendum)
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                          Purcell Nails, Technician, Garrison Columbus, CRNA ?Referring MD:              ?Medicines:                Propofol per Anesthesia ?Complications:            No immediate complications. ?Estimated Blood Loss:     Estimated blood loss was minimal. ?Procedure:                Pre-Anesthesia Assessment: ?                          - Prior to the procedure, a History and Physical  ?                          was performed, and patient medications and  ?                          allergies were reviewed. The patient's tolerance of  ?                          previous anesthesia was also reviewed. The risks  ?                          and benefits of the procedure and the sedation  ?                          options and risks were discussed with the patient.  ?                          All questions were answered, and informed consent  ?                          was obtained. Prior Anticoagulants: The patient has  ?                          taken no previous anticoagulant or antiplatelet  ?                          agents. ASA Grade Assessment: III - A patient with  ?                          severe systemic disease. After reviewing the risks  ?  and benefits, the patient was deemed in  ?                          satisfactory condition to undergo the procedure. ?                          - Sedation was administered by an anesthesia  ?                          professional. Deep sedation was attained. ?                          After obtaining informed consent, the endoscope was  ?                           passed under direct vision. Throughout the  ?                          procedure, the patient's blood pressure, pulse, and  ?                          oxygen saturations were monitored continuously. The  ?                          [id number] ([id number]) Olympus linear ultrasound scope  ?                          was introduced through the mouth, and advanced to  ?                          the second part of duodenum. The upper EUS was  ?                          accomplished without difficulty. The patient  ?                          tolerated the procedure well. ?Scope In: ?Scope Out: ?Findings: ?     ENDOSONOGRAPHIC FINDING: : ?     A round mass was identified in the pancreatic head. The mass was  ?     hypoechoic. The mass measured 16 mm by 12 mm in maximal cross-sectional  ?     diameter. The endosonographic borders were well-defined. The remainder  ?     of the pancreas was examined. The endosonographic appearance of  ?     parenchyma and the upstream pancreatic duct indicated a maximum duct  ?     diameter of 9 mm and parenchymal atrophy. Fine needle aspiration for  ?     cytology was performed. Color Doppler imaging was utilized prior to  ?     needle puncture to confirm a lack of significant vascular structures  ?     within the needle path. Five passes were made with the 22 gauge needle  ?     using a transduodenal approach. A stylet was used. A cytotechnologist  ?     was present to evaluate the adequacy of the specimen. The cellularity  of  ?     the specimen was adequate. Final cytology results are pending. ?     There was dilation in the common bile duct which measured up to 10 mm. ?     One stent was visualized endosonographically in the common bile duct, in  ?     the common hepatic duct and in the intrahepatic bile duct(s). Extension  ?     of the stent was noted in the intrahepatic bile duct(s). ?     The mass was identified in the head of the pancreas and it measured 16  ?     mm x 12.5 mm. This was  also the area where the plastic biliary stent was  ?     passing through in the CBD. Five passes with the 25 gauge FNA needle  ?     were performed and adequate samples were obtained. The CBD was dilated  ?     at 10 mm as well as the PD at 9 mm. The greatest dilation in the PD was  ?     in the head and neck. The pancreatic body and tail were atrophied. No  ?     lesions were found in the left lobe of the liver. ?Impression:               - A mass was identified in the pancreatic head.  ?                          Fine needle aspiration performed. ?                          - There was dilation in the common bile duct which  ?                          measured up to 10 mm. ?                          - One stent was visualized endosonographically in  ?                          the common bile duct, in the common hepatic duct  ?                          and in the intrahepatic bile duct(s). ?Moderate Sedation: ?     None ?Recommendation:           - Return patient to hospital ward for ongoing care. ?                          - Await cytology results. ?                          - Oncology consultation. ?                          - Check CA 19-9. ?Procedure Code(s):        --- Professional --- ?  43238, Esophagogastroduodenoscopy, flexible,  ?                          transoral; with transendoscopic ultrasound-guided  ?                          intramural or transmural fine needle  ?                          aspiration/biopsy(s), (includes endoscopic  ?                          ultrasound examination limited to the esophagus,  ?                          stomach or duodenum, and adjacent structures) ?Diagnosis Code(s):        --- Professional --- ?                          K86.89, Other specified diseases of pancreas ?                          C25.9, Malignant neoplasm of pancreas, unspecified ?                          K83.8, Other specified diseases of biliary tract ?CPT copyright 2019 American  Medical Association. All rights reserved. ?The codes documented in this report are preliminary and upon coder review may  ?be revised to meet current compliance requirements. ?Carol Ada, MD ?Carol Ada, MD ?05/14/2021 10:06:14 AM ?This report has been signed electronically. ?Number of Addenda: 0 ?

## 2021-05-14 NOTE — Anesthesia Procedure Notes (Signed)
Procedure Name: [place] ?Date/Time: 05/14/2021 9:05 AM ?Performed by: Harden Mo, CRNA ?Pre-anesthesia Checklist: Patient identified, Emergency Drugs available, Suction available and Patient being monitored ?Patient Re-evaluated:Patient Re-evaluated prior to induction ?Oxygen Delivery Method: Nasal cannula ?Preoxygenation: Pre-oxygenation with 100% oxygen ?Induction Type: IV induction ?Placement Confirmation: positive ETCO2 and breath sounds checked- equal and bilateral ?Dental Injury: Teeth and Oropharynx as per pre-operative assessment  ? ? ? ? ?

## 2021-05-14 NOTE — TOC Initial Note (Signed)
Transition of Care (TOC) - Initial/Assessment Note  ? ? ?Patient Details  ?Name: Tonya Jenkins ?MRN: [id number] ?Date of Birth: 1945/03/18 ? ?Transition of Care [facility]) CM/SW Contact:    ?Ninfa Meeker, RN ?Phone Number: ?05/14/2021, 4:35 PM ? ?Clinical Narrative:                 ?Transition of Care Screening Note: ? ?Transition of Care Department [facility]) has reviewed patient and no TOC needs have been identified at this time. We will continue to monitor patient advancement through Interdisciplinary progressions. If new patient transition needs arise, please place a consult.  ?  ?  ? ? ?Patient Goals and CMS Choice ?  ?  ?  ? ?Expected Discharge Plan and Services ?  ?  ?  ?  ?  ?                ?  ?  ?  ?  ?  ?  ?  ?  ?  ?  ? ?Prior Living Arrangements/Services ?  ?  ?  ?       ?  ?  ?  ?  ? ?Activities of Daily Living ?Home Assistive Devices/Equipment: Gilford Rile (specify type) ?ADL Screening (condition at time of admission) ?Patient's cognitive ability adequate to safely complete daily activities?: Yes ?Is the patient deaf or have difficulty hearing?: No ?Does the patient have difficulty seeing, even when wearing glasses/contacts?: No ?Does the patient have difficulty concentrating, remembering, or making decisions?: No ?Patient able to express need for assistance with ADLs?: Yes ?Does the patient have difficulty dressing or bathing?: No ?Independently performs ADLs?: Yes (appropriate for developmental age) ?Does the patient have difficulty walking or climbing stairs?: No ?Weakness of Legs: Both ?Weakness of Arms/Hands: None ? ?Permission Sought/Granted ?  ?  ?   ?   ?   ?   ? ?Emotional Assessment ?  ?  ?  ?  ?  ?  ? ?Admission diagnosis:  Hyperbilirubinemia [E80.6] ?Obstructive jaundice [K83.1] ?Patient Active Problem List  ? Diagnosis Date Noted  ? Obstructive jaundice secondary to pancreatic mass 05/12/2021  ? Normocytic anemia 05/12/2021  ? Essential hypertension 05/12/2021  ? Pancreatic mass 05/12/2021  ?  Hypokalemia 05/12/2021  ? Possible AKI (acute kidney injury) ([place]) 05/12/2021  ? Abnormal urinalysis 05/12/2021  ? Hyperbilirubinemia 05/12/2021  ? Elevated liver enzymes 05/12/2021  ? Hyperglycemia 05/12/2021  ? OSA on CPAP 05/12/2021  ? Snoring 01/01/2021  ? Closed fracture of metatarsal bone 12/10/2018  ? Right foot pain 02/05/2018  ? Hallux valgus of right foot 11/09/2017  ? Osteoarthritis of knee 10/14/2015  ? ?PCP:  [name], MD ?Pharmacy:   ?[facility] ?[place] [address] ?Phone: [phone] Fax: [phone] ? ? ? ? ?Social Determinants of Health (SDOH) Interventions ?  ? ?Readmission Risk Interventions ?No flowsheet data found. ? ? ?

## 2021-05-14 NOTE — Progress Notes (Signed)
Initial Nutrition Assessment ? ?DOCUMENTATION CODES:  ? ?Not applicable ? ?INTERVENTION:  ? ?- [employer] [place] Standard 1.4 po BID, each supplement provides 455 kcal and 20 grams of protein ? ?- Snacks BID between meals ? ?- MVI with minerals daily ? ?- Encourage PO intake ? ?NUTRITION DIAGNOSIS:  ? ?Inadequate oral intake related to poor appetite as evidenced by per patient/family report. ? ?GOAL:  ? ?Patient will meet greater than or equal to 90% of their needs ? ?MONITOR:  ? ?PO intake, Supplement acceptance, Labs, Weight trends ? ?REASON FOR ASSESSMENT:  ? ?Consult ?Assessment of nutrition requirement/status ? ?ASSESSMENT:  ? ?76 year old female who presented to the ED on 3/07 to be evaluated for jaundice. PMH of HTN, OSA, DM, gait disturbance as a result of prior back surgery. Pt admitted with obstructive jaundice secondary to pancreatic mass/transaminitis/hyperbilirubinemia. ? ?03/09 - s/p ERCP with unsuccessful stent placement, s/p anterograde cholangiogram with percutaneous biliary drainage catheter placement ?03/10 - s/p upper EUS ? 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 ? ?Spoke with pt and friend at bedside. Pt reports poor appetite since admission and decreased appetite for 2-3 days PTA. Pt reports that when she feels well, she typically eats 2 meals daily. Breakfast includes peanut butter toast or avocado toast with coffee and milk in her coffee. Lunch/dinner includes chicken salad or leftovers or soup. Pt typically drinks warm/hot water because she prefers it this way. Pt snacks on things like peanut butter and fruit. Pt has a garden at home and grows fresh vegetables. ? ?Pt denies any recent weight loss. Pt reports a UBW of 135-145 lbs. She believes that recently she may have gained some weight (potentially fluid) up to 155 lbs. Pt reports some abdominal distention. Current weight is 148 lbs. Weight history in chart indicates pt's weight has been fairly stable over time between 65.8-68 kg  over the last 1.5 years. ? ?Pt reports dislike of Boost and Ensure oral nutrition supplements. She prefers to eat "real food." Pt willing to try [employer] organic oral nutrition supplements. RD to order. Will also order snacks BID between meals along with daily MVI. Pt reports taking weekly vitamin D and daily B-complex and vitamin C at home PTA. ? ?Encouraged pt to make her own protein shakes at home with fruit, vegetables, yogurt, milk, protein powder, etc. Discussed importance of adequate kcal and protein intake in maintaining lean muscle mass. Also discussed increased nutrient needs. ? ?Meal Completion: 100% x 1 meal on 3/10 ? ?Medications reviewed and include: questran 4 grams BID, SSI, melatonin, IV abx ? ?Labs reviewed: sodium 134, creatinine 1.07, elevated LFTs, WBC 13.3 ?CBG's: 187-230 x 24 hours ? ?UOP: 700 ml x 12 hours ?Biliary drain: 300 ml x 24 hours ?I/O's +2.5 L since admit ? ?NUTRITION - FOCUSED PHYSICAL EXAM: ? 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 ?  ? ? ?Diet Order:   ?Diet Order   ? ?       ?  Diet regular Room service appropriate? Yes; Fluid consistency: Thin  Diet effective now       ?  ? ?  ?  ? ?  ? ? ?EDUCATION NEEDS:  ? ?Education needs have been addressed ? ?Skin:  Skin Assessment: Reviewed RN Assessment ? ?Last BM:  05/12/21 ? ?Height:  ? ?Ht Readings from Last 1 Encounters:  ?05/13/21 5' (1.524 m)  ? ? ?Weight:   ? ?Wt Readings from Last 1 Encounters:  ?05/13/21 67.2 kg  ? ? ?BMI:  Body mass index is 28.93 kg/m?. ? ?Estimated Nutritional Needs:  ? ?Kcal:  1700-1900 ? ?Protein:  80-95 grams ? ?Fluid:  1.7-1.9 L ? ? ? ?Gustavus Bryant, MS, RD, LDN ?Inpatient Clinical Dietitian ?Please see [facility] for contact information. ? ?

## 2021-05-14 NOTE — Consult Note (Addendum)
[place]  [phone] [phone]   MEDICAL ONCOLOGY - INITIAL CONSULTATION  Referral MD: Dr. Phillips Climes  Reason for Referral: Pancreatic mass  HPI: Tonya Jenkins is a 76 year old female with a past medical history significant for hypertension, OSA, and gait disturbance as a result of prior back surgery.  She presented to the emergency department with jaundice and pruritus.  Symptoms started about 6 days prior to admission where she developed itching of her skin with a red, bumpy rash.  Itching was severe and had only mild relief with cream she was given.  Additionally, she has had severe fatigue, intermittent abdominal pain, change in her skin color, diarrhea, light-colored stools.  Initial lab work showed a hemoglobin of 11.5, potassium 3.3, creatinine 1.21, AST 686, ALT 1242, alk phos 402, T. bili 8.7. Abdominal ultrasound showed dilatation of the common bile duct and pancreatic ducts.  CT abdomen/pelvis with contrast showed marked severity intrahepatic and extrahepatic biliary dilatation with an abrupt narrowing of the common bile duct and pancreatic duct at the level of the pancreatic head.  An MR CP was performed which showed a 3.6 x 2.1 x 4.2 cm enhancing lesion in the head of the pancreas obstructing the pancreatic duct and common bile duct highly concerning for primary pancreatic neoplasm.  She underwent ERCP on 05/13/2021 and bile duct obstruction was noted.  Stent could not be placed.  She had a biliary drain placed by IR on 05/13/2021.  She underwent EUS earlier today which showed a round mass in the pancreatic head which was hyperechoic.  It measured 16 mm x 12 mm.  Fine-needle aspiration for cytology was performed.  Cytology pending.  CA 19.9 has been ordered and is currently pending.  The patient was seen in her hospital room.  Two of her friends were at the bedside.  The patient reports that she has had a very pruritic rash for at least a few weeks.  Was  initially seen at urgent care and given hydroxyzine and triamcinolone cream.  She never tried the hydroxyzine and had minimal relief from the triamcinolone.  She was subsequently seen by dermatology for evaluation of the rash at her dermatologist noted jaundice and advised her to seek evaluation in the emergency department.  She reports ongoing itching which may be somewhat improved.  She has been having extreme fatigue.  She reports a poor appetite but does not think that she has lost any weight.  Denies headaches, dizziness, chest pain, shortness of breath.  She was not having abdominal pain prior to admission but has been having abdominal pain since her multiple procedures.  She reports nausea but is not having any vomiting.  Reports clay colored stools.  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  The patient lives alone with her dog.  Denies history of alcohol and tobacco use.  Family history significant for a brother with prostate cancer.  Medical oncology was asked see the patient make recommendations regarding her pancreatic mass.  Past Medical History:  Diagnosis Date   History of hip surgery    Hx of neck surgery    Hypertension   :   Past Surgical History:  Procedure Laterality Date   CERVICAL SPINE SURGERY     ERCP N/A 05/13/2021   Procedure: ENDOSCOPIC RETROGRADE CHOLANGIOPANCREATOGRAPHY (ERCP);  Surgeon: Carol Ada, MD;  Location: [facility];  Service: Gastroenterology;  Laterality: N/A;   HAMMER TOE SURGERY     HIP  SURGERY     KNEE SURGERY    :   Current Facility-Administered Medications  Medication Dose Route Frequency Provider Last Rate Last Admin   0.9 %  sodium chloride infusion   Intravenous Continuous Carol Ada, MD 75 mL/hr at 05/14/21 0534 New Bag at 05/14/21 0534   albuterol (PROVENTIL) (2.5 MG/3ML) 0.083% nebulizer solution 2.5 mg  2.5 mg Nebulization Q6H PRN Carol Ada, MD       cholestyramine Lucrezia Starch) packet 4 g   4 g Oral BID Carol Ada, MD   4 g at 05/13/21 2140   ciprofloxacin (CIPRO) IVPB 400 mg  400 mg Intravenous Q12H Carol Ada, MD   Stopped at 05/13/21 2342   diltiazem (CARDIZEM CD) 24 hr capsule 180 mg  180 mg Oral QHS Carol Ada, MD   180 mg at 05/13/21 2125   enoxaparin (LOVENOX) injection 40 mg  40 mg Subcutaneous Q24H Carol Ada, MD       fentaNYL (SUBLIMAZE) injection 12.5 mcg  12.5 mcg Intravenous Q2H PRN Vernelle Emerald, MD   12.5 mcg at 05/14/21 0015   hydrOXYzine (ATARAX) tablet 25 mg  25 mg Oral Q8H PRN Carol Ada, MD       insulin aspart (novoLOG) injection 0-9 Units  0-9 Units Subcutaneous TID WC Elgergawy, Silver Huguenin, MD       irbesartan (AVAPRO) tablet 300 mg  300 mg Oral Daily Carol Ada, MD   300 mg at 05/13/21 2125   labetalol (NORMODYNE) injection 10 mg  10 mg Intravenous Q3H PRN Carol Ada, MD   10 mg at 05/12/21 2354   melatonin tablet 3 mg  3 mg Oral QHS Carol Ada, MD   3 mg at 05/13/21 2125   metroNIDAZOLE (FLAGYL) IVPB 500 mg  500 mg Intravenous Carma Leaven, MD 100 mL/hr at 05/14/21 0556 500 mg at 05/14/21 0556   naphazoline-glycerin (CLEAR EYES REDNESS) ophth solution 2 drop  2 drop Left Eye QID PRN Elgergawy, Silver Huguenin, MD       ondansetron (ZOFRAN) tablet 4 mg  4 mg Oral Q6H PRN Carol Ada, MD       Or   ondansetron [facility]) injection 4 mg  4 mg Intravenous Q6H PRN Carol Ada, MD   4 mg at 05/13/21 2202   sodium chloride flush (NS) 0.9 % injection 10 mL  10 mL Intracatheter Q12H Mugweru, Jon, MD       sodium chloride flush (NS) 0.9 % injection 10 mL  10 mL Intracatheter Q12H Mugweru, Jon, MD   10 mL at 05/14/21 0323   sodium chloride flush (NS) 0.9 % injection 3 mL  3 mL Intravenous Q12H Carol Ada, MD   3 mL at 05/13/21 2244      Allergies  Allergen Reactions   Amlodipine Besylate     Other reaction(s): severe HA   Hydrochlorothiazide     Other reaction(s): hallucinations   Lexapro [Escitalopram Oxalate]     Pt stated, "It  gave me hallucinations"   Oxycodone    Penicillins Other (See Comments)  :  History reviewed. No pertinent family history.:   Social History   Socioeconomic History   Marital status: Divorced    Spouse name: Not on file   Number of children: Not on file   Years of education: Not on file   Highest education level: Not on file  Occupational History   Not on file  Tobacco Use   Smoking status: Never   Smokeless tobacco: Never  Substance  and Sexual Activity   Alcohol use: Not Currently   Drug use: Not Currently   Sexual activity: Not on file  Other Topics Concern   Not on file  Social History Narrative   Not on file   Social Determinants of Health   Financial Resource Strain: Not on file  Food Insecurity: Not on file  Transportation Needs: Not on file  Physical Activity: Not on file  Stress: Not on file  Social Connections: Not on file  Intimate Partner Violence: Not on file  :  Review of Systems: A comprehensive 14 point review of systems was negative except as noted in the HPI.  Exam: Patient Vitals for the past 24 hrs:  BP Temp Temp src Pulse Resp SpO2  05/14/21 1219 (!) 144/72 98.7 F (37.1 C) Oral 65 17 93 %  05/14/21 1132 (!) 121/96 -- -- 68 18 95 %  05/14/21 1128 138/82 -- -- 65 19 92 %  05/14/21 1010 (!) 125/58 98.7 F (37.1 C) -- 60 14 91 %  05/14/21 0955 (!) 126/54 98.7 F (37.1 C) -- 66 17 95 %  05/14/21 0755 (!) 135/55 (!) 97.5 F (36.4 C) Temporal 66 17 96 %  05/14/21 0736 129/70 98.6 F (37 C) Oral -- 20 --  05/14/21 0725 135/63 98.8 F (37.1 C) Oral 71 19 96 %  05/14/21 0325 138/62 98.2 F (36.8 C) Oral 68 19 94 %  05/14/21 0020 (!) 142/60 -- -- 66 19 95 %  05/13/21 2312 (!) 164/69 98.5 F (36.9 C) Oral 67 17 94 %  05/13/21 2239 (!) 157/77 -- -- 63 16 94 %  05/13/21 2205 (!) 175/76 -- -- 61 16 96 %  05/13/21 2129 (!) 186/76 -- -- 64 20 95 %  05/13/21 2000 (!) 153/68 -- -- 65 13 95 %  05/13/21 1932 (!) 161/72 97.8 F (36.6 C) Oral 65 15  94 %  05/13/21 1735 (!) 174/74 -- -- 65 19 97 %  05/13/21 1710 (!) 199/90 -- -- (!) 55 18 100 %  05/13/21 1705 (!) 193/76 -- -- (!) 54 17 100 %  05/13/21 1700 (!) 209/87 -- -- (!) 55 14 100 %  05/13/21 1655 (!) 216/91 -- -- 60 15 100 %  05/13/21 1650 (!) 195/81 -- -- (!) 57 13 100 %  05/13/21 1645 (!) 180/79 -- -- (!) 57 11 100 %  05/13/21 1640 (!) 173/78 -- -- (!) 57 12 100 %  05/13/21 1635 (!) 168/74 -- -- (!) 57 12 100 %  05/13/21 1630 (!) 159/75 -- -- (!) 57 12 100 %  05/13/21 1625 (!) 157/74 -- -- (!) 57 11 100 %  05/13/21 1620 (!) 156/73 -- -- (!) 58 11 100 %  05/13/21 1615 (!) 159/76 -- -- (!) 58 11 100 %  05/13/21 1610 (!) 172/70 -- -- 60 11 100 %  05/13/21 1605 (!) 192/79 -- -- 62 11 100 %  05/13/21 1600 (!) 203/90 -- -- (!) 58 15 100 %  05/13/21 1555 (!) 153/77 -- -- (!) 56 13 100 %  05/13/21 1550 133/66 -- -- (!) 56 11 100 %  05/13/21 1545 136/70 -- -- (!) 55 12 100 %  05/13/21 1540 (!) 142/68 -- -- (!) 57 (!) 7 98 %  05/13/21 1535 (!) 158/73 -- -- 61 10 99 %  05/13/21 1530 (!) 149/75 -- -- (!) 124 11 100 %    General: Awake and alert, no distress. Eyes: Scleral icterus present. ENT:  There were no oropharyngeal lesions.   Lymphatics:  Negative cervical, supraclavicular or axillary adenopathy.   Respiratory: lungs were clear bilaterally without wheezing or crackles.   Cardiovascular:  Regular rate and rhythm, S1/S2, without murmur, rub or gallop.  There was no pedal edema.   GI:  abdomen was soft, flat, nontender, nondistended, without organomegaly.    Skin: Rash noted to her trunk and upper legs. Neuro exam was nonfocal. Patient was alert and oriented.  Decreased sensation in the right lower extremity and foot.   Lab Results  Component Value Date   WBC 13.3 (H) 05/14/2021   HGB 11.2 (L) 05/14/2021   HCT 32.8 (L) 05/14/2021   PLT 267 05/14/2021   GLUCOSE 219 (H) 05/14/2021   ALT 915 (H) 05/14/2021   AST 420 (H) 05/14/2021   NA 134 (L) 05/14/2021   K 4.4  05/14/2021   CL 101 05/14/2021   CREATININE 1.07 (H) 05/14/2021   BUN 16 05/14/2021   CO2 24 05/14/2021    US Abdomen Complete  Result Date: 05/11/2021 CLINICAL DATA:  Left upper quadrant tenderness. EXAM: ABDOMEN ULTRASOUND COMPLETE COMPARISON:  CT 07/21/2020 FINDINGS: Gallbladder: Distended. No gallstones or wall thickening visualized. No sonographic Murphy sign noted by sonographer. Common bile duct: Diameter: 7 mm proximally, flares to 10 mm distally Liver: No focal lesion identified. Increased and heterogeneous in parenchymal echogenicity. No capsular nodularity. Portal vein is patent on color Doppler imaging with normal direction of blood flow towards the liver. IVC: No abnormality visualized. Pancreas: Dilated pancreatic duct at 10 mm. No pancreatic mass is seen. Spleen: Normal in size. No focal sonographic abnormality. Calcified granuloma on CT are not seen. Right Kidney: Length: 9.8 cm. Suggestion of mild hydronephrosis. No visualized renal calculi. Lower pole cyst on prior CT is not seen by ultrasound. Left Kidney: Length: 9.5 cm. No hydronephrosis. No visualized renal calculi. No focal renal abnormality. Abdominal aorta: No aneurysm visualized. Other findings: No abdominal ascites. IMPRESSION: 1. Dilatation of the common bile duct and pancreatic ducts. This can be seen in the setting of pancreatic head mass, although no focal pancreatic lesion is seen by ultrasound. Recommend further assessment with created protocol MRI, or contrast-enhanced abdominopelvic CT if patient cannot tolerate breath hold technique. 2. Mild right hydronephrosis, of unknown etiology. 3. Hepatic steatosis. Electronically Signed   By: Keith Rake M.D.   On: 05/11/2021 21:25   CT ABDOMEN PELVIS W CONTRAST  Result Date: 05/12/2021 CLINICAL DATA:  Abdominal pain. EXAM: CT ABDOMEN AND PELVIS WITH CONTRAST TECHNIQUE: Multidetector CT imaging of the abdomen and pelvis was performed using the standard protocol following  bolus administration of intravenous contrast. RADIATION DOSE REDUCTION: This exam was performed according to the departmental dose-optimization program which includes automated exposure control, adjustment of the mA and/or kV according to patient size and/or use of iterative reconstruction technique. CONTRAST:  43m OMNIPAQUE IOHEXOL 300 MG/ML  SOLN COMPARISON:  Jul 21, 2020 FINDINGS: Lower chest: No acute abnormality. Hepatobiliary: No focal liver abnormality is seen. Marked severity intrahepatic biliary dilatation is noted. The gallbladder is markedly distended without evidence of gallstones are gallbladder wall thickening. The common bile duct is dilated and measures approximately 13 mm in diameter. Abrupt narrowing of the common bile duct is seen at the level of the pancreatic head. (Axial CT images 28 through 31, CT series 3. This represents a new finding when compared to the prior study. Pancreas: Pancreatic duct is also dilated (12 mm in diameter) with abrupt narrowing seen within the pancreatic  head (axial CT images 30 through 32, CT series 3). This represents a new finding when compared to the prior exam. Mild prominence of the pancreatic head is noted without an underlying mass lesion. No peripancreatic inflammatory fat stranding is seen. Spleen: Normal in size without focal abnormality. Adrenals/Urinary Tract: Adrenal glands are unremarkable. Kidneys are normal in size, without renal calculi or hydronephrosis. Small bilateral simple renal cysts are noted. Bladder is unremarkable. Stomach/Bowel: Stomach is within normal limits. Appendix appears normal. No evidence of bowel wall thickening, distention, or inflammatory changes. Noninflamed diverticula are seen throughout the large bowel. Vascular/Lymphatic: Aortic atherosclerosis. No enlarged abdominal or pelvic lymph nodes. Reproductive: Uterus and bilateral adnexa are unremarkable. Other: There is a 2.1 cm 1.0 cm fat containing umbilical hernia. No  abdominopelvic ascites. Musculoskeletal: Multilevel degenerative changes are noted throughout the lumbar spine. IMPRESSION: 1. Marked severity intrahepatic and extrahepatic biliary dilatation with an abrupt narrowing of the common bile duct and pancreatic duct at the level of the pancreatic head. Further correlation with MRCP is recommended. 2. Markedly distended gallbladder without evidence of cholelithiasis or acute cholecystitis. 3. Colonic diverticulosis. 4. Small bilateral simple renal cysts. 5. 2.1 cm fat containing umbilical hernia. 6. Aortic atherosclerosis. Aortic Atherosclerosis (ICD10-I70.0). Electronically Signed   By: Virgina Norfolk M.D.   On: 05/12/2021 01:23   DG ERCP  Result Date: 05/13/2021 CLINICAL DATA:  Biliary obstruction. EXAM: ERCP COMPARISON:  CT AP, 05/12/2021 and 07/21/2020.  MRCP, 05/12/2021. FLUOROSCOPY: Exposure Index (as provided by the fluoroscopic device): 37 mGy Kerma FINDINGS: Limited oblique planar images of the RIGHT upper quadrant obtained C-arm. Images demonstrating flexible endoscopy, attempted biliary duct cannulation and retrograde cholangiogram. IMPRESSION: Fluoroscopic imaging for ERCP. No discrete retrograde biliary opacification For complete description of intra procedural findings, please see performing service dictation. Electronically Signed   By: Michaelle Birks M.D.   On: 05/13/2021 09:40   MR ABDOMEN MRCP W WO CONTAST  Result Date: 05/12/2021 CLINICAL DATA:  76 year old female with history of jaundice and abdominal pain. Biliary obstruction noted on recent CT examination. Follow-up study. EXAM: MRI ABDOMEN WITHOUT AND WITH CONTRAST (INCLUDING MRCP) TECHNIQUE: Multiplanar multisequence MR imaging of the abdomen was performed both before and after the administration of intravenous contrast. Heavily T2-weighted images of the biliary and pancreatic ducts were obtained, and three-dimensional MRCP images were rendered by post processing. CONTRAST:  46m GADAVIST  GADOBUTROL 1 MMOL/ML IV SOLN COMPARISON:  No prior abdominal MRI. CT of the abdomen and pelvis 05/12/2021. FINDINGS: Comment: Portions of today's examination are limited by considerable patient respiratory motion. Lower chest: Unremarkable. Hepatobiliary: Within the limitations of today's examination, there are no definite suspicious cystic or solid hepatic lesions. Severe intra and extrahepatic biliary ductal dilatation is noted. Gallbladder is moderately distended. Gallbladder wall thickness is normal. No filling defects to suggest gallstones. No definite pericholecystic fluid. No filling defects within the common bile duct to suggest choledocholithiasis. Common bile duct measures up to 14 mm in the porta hepatis. There is abrupt cut off of the distal common bile duct in the region of the pancreatic head. Pancreas: MRCP images demonstrate severe diffuse pancreatic ductal dilatation measuring up to 8 mm in the pancreatic body. In the pancreatic head there is a enhancing mass-like area which is poorly delineated on today's motion limited examination, but estimated to measure approximately 3.6 x 2.1 x 4.2 cm (axial image 55 of series 22 and coronal image 51 of series 31), concerning for pancreatic neoplasm. This causes abrupt cut off of the distal  pancreatic duct, as well as the distal common bile duct. No peripancreatic fluid collections or inflammatory changes. Spleen:  Unremarkable. Adrenals/Urinary Tract: Multiple small T1 hypointense, T2 hyperintense, nonenhancing lesions in both kidneys are compatible with tiny simple cysts. No hydroureteronephrosis in the visualized portions of the abdomen. Bilateral adrenal glands are normal in appearance. Stomach/Bowel: Visualized portions are unremarkable. Vascular/Lymphatic: No aneurysm identified in the visualized abdominal vasculature. No lymphadenopathy noted in the abdomen Other: No significant volume of ascites noted in the visualized portions of the peritoneal  cavity. Musculoskeletal: No aggressive appearing osseous lesions are noted in the visualized portions of the skeleton. IMPRESSION: 1. 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 Further evaluation with endoscopic ultrasound is strongly recommended at this time. Electronically Signed   By: Vinnie Langton M.D.   On: 05/12/2021 07:05     US Abdomen Complete  Result Date: 05/11/2021 CLINICAL DATA:  Left upper quadrant tenderness. EXAM: ABDOMEN ULTRASOUND COMPLETE COMPARISON:  CT 07/21/2020 FINDINGS: Gallbladder: Distended. No gallstones or wall thickening visualized. No sonographic Murphy sign noted by sonographer. Common bile duct: Diameter: 7 mm proximally, flares to 10 mm distally Liver: No focal lesion identified. Increased and heterogeneous in parenchymal echogenicity. No capsular nodularity. Portal vein is patent on color Doppler imaging with normal direction of blood flow towards the liver. IVC: No abnormality visualized. Pancreas: Dilated pancreatic duct at 10 mm. No pancreatic mass is seen. Spleen: Normal in size. No focal sonographic abnormality. Calcified granuloma on CT are not seen. Right Kidney: Length: 9.8 cm. Suggestion of mild hydronephrosis. No visualized renal calculi. Lower pole cyst on prior CT is not seen by ultrasound. Left Kidney: Length: 9.5 cm. No hydronephrosis. No visualized renal calculi. No focal renal abnormality. Abdominal aorta: No aneurysm visualized. Other findings: No abdominal ascites. IMPRESSION: 1. Dilatation of the common bile duct and pancreatic ducts. This can be seen in the setting of pancreatic head mass, although no focal pancreatic lesion is seen by ultrasound. Recommend further assessment with created protocol MRI, or contrast-enhanced abdominopelvic CT if patient cannot tolerate breath hold technique. 2. Mild right hydronephrosis, of unknown  etiology. 3. Hepatic steatosis. Electronically Signed   By: Keith Rake M.D.   On: 05/11/2021 21:25   CT ABDOMEN PELVIS W CONTRAST  Result Date: 05/12/2021 CLINICAL DATA:  Abdominal pain. EXAM: CT ABDOMEN AND PELVIS WITH CONTRAST TECHNIQUE: Multidetector CT imaging of the abdomen and pelvis was performed using the standard protocol following bolus administration of intravenous contrast. RADIATION DOSE REDUCTION: This exam was performed according to the departmental dose-optimization program which includes automated exposure control, adjustment of the mA and/or kV according to patient size and/or use of iterative reconstruction technique. CONTRAST:  29m OMNIPAQUE IOHEXOL 300 MG/ML  SOLN COMPARISON:  Jul 21, 2020 FINDINGS: Lower chest: No acute abnormality. Hepatobiliary: No focal liver abnormality is seen. Marked severity intrahepatic biliary dilatation is noted. The gallbladder is markedly distended without evidence of gallstones are gallbladder wall thickening. The common bile duct is dilated and measures approximately 13 mm in diameter. Abrupt narrowing of the common bile duct is seen at the level of the pancreatic head. (Axial CT images 28 through 31, CT series 3. This represents a new finding when compared to the prior study. Pancreas: Pancreatic duct is also dilated (12 mm in diameter) with abrupt narrowing seen within the pancreatic head (axial CT images 30 through 32, CT series  3). This represents a new finding when compared to the prior exam. Mild prominence of the pancreatic head is noted without an underlying mass lesion. No peripancreatic inflammatory fat stranding is seen. Spleen: Normal in size without focal abnormality. Adrenals/Urinary Tract: Adrenal glands are unremarkable. Kidneys are normal in size, without renal calculi or hydronephrosis. Small bilateral simple renal cysts are noted. Bladder is unremarkable. Stomach/Bowel: Stomach is within normal limits. Appendix appears normal. No  evidence of bowel wall thickening, distention, or inflammatory changes. Noninflamed diverticula are seen throughout the large bowel. Vascular/Lymphatic: Aortic atherosclerosis. No enlarged abdominal or pelvic lymph nodes. Reproductive: Uterus and bilateral adnexa are unremarkable. Other: There is a 2.1 cm 1.0 cm fat containing umbilical hernia. No abdominopelvic ascites. Musculoskeletal: Multilevel degenerative changes are noted throughout the lumbar spine. IMPRESSION: 1. Marked severity intrahepatic and extrahepatic biliary dilatation with an abrupt narrowing of the common bile duct and pancreatic duct at the level of the pancreatic head. Further correlation with MRCP is recommended. 2. Markedly distended gallbladder without evidence of cholelithiasis or acute cholecystitis. 3. Colonic diverticulosis. 4. Small bilateral simple renal cysts. 5. 2.1 cm fat containing umbilical hernia. 6. Aortic atherosclerosis. Aortic Atherosclerosis (ICD10-I70.0). Electronically Signed   By: Virgina Norfolk M.D.   On: 05/12/2021 01:23   DG ERCP  Result Date: 05/13/2021 CLINICAL DATA:  Biliary obstruction. EXAM: ERCP COMPARISON:  CT AP, 05/12/2021 and 07/21/2020.  MRCP, 05/12/2021. FLUOROSCOPY: Exposure Index (as provided by the fluoroscopic device): 37 mGy Kerma FINDINGS: Limited oblique planar images of the RIGHT upper quadrant obtained C-arm. Images demonstrating flexible endoscopy, attempted biliary duct cannulation and retrograde cholangiogram. IMPRESSION: Fluoroscopic imaging for ERCP. No discrete retrograde biliary opacification For complete description of intra procedural findings, please see performing service dictation. Electronically Signed   By: Michaelle Birks M.D.   On: 05/13/2021 09:40   MR ABDOMEN MRCP W WO CONTAST  Result Date: 05/12/2021 CLINICAL DATA:  76 year old female with history of jaundice and abdominal pain. Biliary obstruction noted on recent CT examination. Follow-up study. EXAM: MRI ABDOMEN WITHOUT  AND WITH CONTRAST (INCLUDING MRCP) TECHNIQUE: Multiplanar multisequence MR imaging of the abdomen was performed both before and after the administration of intravenous contrast. Heavily T2-weighted images of the biliary and pancreatic ducts were obtained, and three-dimensional MRCP images were rendered by post processing. CONTRAST:  54m GADAVIST GADOBUTROL 1 MMOL/ML IV SOLN COMPARISON:  No prior abdominal MRI. CT of the abdomen and pelvis 05/12/2021. FINDINGS: Comment: Portions of today's examination are limited by considerable patient respiratory motion. Lower chest: Unremarkable. Hepatobiliary: Within the limitations of today's examination, there are no definite suspicious cystic or solid hepatic lesions. Severe intra and extrahepatic biliary ductal dilatation is noted. Gallbladder is moderately distended. Gallbladder wall thickness is normal. No filling defects to suggest gallstones. No definite pericholecystic fluid. No filling defects within the common bile duct to suggest choledocholithiasis. Common bile duct measures up to 14 mm in the porta hepatis. There is abrupt cut off of the distal common bile duct in the region of the pancreatic head. Pancreas: MRCP images demonstrate severe diffuse pancreatic ductal dilatation measuring up to 8 mm in the pancreatic body. In the pancreatic head there is a enhancing mass-like area which is poorly delineated on today's motion limited examination, but estimated to measure approximately 3.6 x 2.1 x 4.2 cm (axial image 55 of series 22 and coronal image 51 of series 31), concerning for pancreatic neoplasm. This causes abrupt cut off of the distal pancreatic duct, as well as the distal common bile  duct. No peripancreatic fluid collections or inflammatory changes. Spleen:  Unremarkable. Adrenals/Urinary Tract: Multiple small T1 hypointense, T2 hyperintense, nonenhancing lesions in both kidneys are compatible with tiny simple cysts. No hydroureteronephrosis in the visualized  portions of the abdomen. Bilateral adrenal glands are normal in appearance. Stomach/Bowel: Visualized portions are unremarkable. Vascular/Lymphatic: No aneurysm identified in the visualized abdominal vasculature. No lymphadenopathy noted in the abdomen Other: No significant volume of ascites noted in the visualized portions of the peritoneal cavity. Musculoskeletal: No aggressive appearing osseous lesions are noted in the visualized portions of the skeleton. IMPRESSION: 1. 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 Further evaluation with endoscopic ultrasound is strongly recommended at this time. Electronically Signed   By: Vinnie Langton M.D.   On: 05/12/2021 07:05    Assessment and Plan:  1.  Pancreatic mass 2.  Obstructive jaundice secondary to pancreatic mass/transaminitis/hyperbilirubinemia 3.  Mild leukocytosis 4.  Normocytic anemia 5.  AKI, improving 6.  Protein calorie malnutrition 7.  Hypertension 8.  Arthritis 9.  Gait disturbance secondary to loss of sensation in her left lower extremity  -Discussed imaging findings with the patient and her friends.  I also discussed the preliminary results of her biopsy from earlier today which was consistent with adenocarcinoma.  It appears that her original tumor originated in the pancreas and this would represent pancreatic adenocarcinoma.  However, will await final pathology.  We will also follow-up on CA 19.9. -Recommend CT chest as part of her initial staging work-up. -We discussed that we will review her case in the GI multidisciplinary conference.  May be a candidate for surgical intervention if no evidence of metastatic disease.   -Recommend outpatient genetic counseling if final pathology consistent with pancreatic adenocarcinoma. -Status post biliary drain placement with some improvement of her T. bili and LFTs.   Continue to monitor liver function closely. -Recommend dietitian consult.  Thank you for this referral.   Mikey Bussing, DNP, AGPCNP-BC, AOCNP   Addendum  I have seen the patient, examined her. I agree with the assessment and and plan and have edited the notes.   76 yo female, with PMH of hypertension, OSA, presented with jaundice and pruritus.  I have reviewed her CT scan images, and recent procedure notes.  She has a 3.6 x 2.1 x 4.2 cm mass in the head of pancreas, she has caused significant obstructive jaundice, status post PTCA by IR yesterday and ERCP by Dr. Benson Norway today.  EUS and fine-needle biopsy of the pancreas mass was done today, cytology still pending.  CT chest is pending to complete a staging.  I reviewed the above findings with patient and her friend today.  If no evidence of metastasis on CT chest, this is likely resectable pancreatic cancer.  However given the size of tumor and her age, no adjuvant chemotherapy is likely needed before her definitive Whipple surgery, if she is a candidate for the surgery.  I plan to review her case in our GI tumor board, particular discussed with Dr. Barry Dienes and Dr. Zenia Resides regarding her resectability. I will set up a follow-up with me in the office next week. All questions were answered.   Tonya Merle MD  05/14/2021

## 2021-05-14 NOTE — Interval H&P Note (Signed)
History and Physical Interval Note: ? ?05/14/2021 ?8:20 AM ? ?Tonya Jenkins  has presented today for surgery, with the diagnosis of Pancreatic mass.  The various methods of treatment have been discussed with the patient and family. After consideration of risks, benefits and other options for treatment, the patient has consented to  Procedure(s): ?ESOPHAGOGASTRODUODENOSCOPY (EGD) WITH PROPOFOL (N/A) ?UPPER ENDOSCOPIC ULTRASOUND (EUS) LINEAR (Left) as a surgical intervention.  The patient's history has been reviewed, patient examined, no change in status, stable for surgery.  I have reviewed the patient's chart and labs.  Questions were answered to the patient's satisfaction.   ? ? ?Tonya Jenkins ? ? ?

## 2021-05-14 NOTE — Plan of Care (Signed)
Pt had severe pain at the beginning of the shift. Orders for IV Fentanyl every 3 hours. Pt c/o pain before 3 hours was up. MD notified and Fentanyl frequency changed to every 2 hours. Pt had 2 doses and also IV Zofran. Received orders for sips and chips and po meds. Pt up to [facility] with stand-by assist. Friend at bedside 24 hours a day. Pt started on IV Cipro and IV Flagyl r/t perforation during CBD stent attempts. Pt to have EGD with Pancreatic BX today. Pt anxious but refuses meds for this or for the rash she has. Resp easy per RA. BP elevated this shift but pain wasn't under control so no IV Labatolol given. POC explained to pt and to MD if BP elevation persisted 1 hour after pain meds then Labatolol would be given. Pt with RUQ Bili drain with q 12 hour saline flushes ?

## 2021-05-14 NOTE — Transfer of Care (Signed)
Immediate Anesthesia Transfer of Care Note ? ?Patient: Tonya Jenkins ? ?Procedure(s) Performed: ESOPHAGOGASTRODUODENOSCOPY (EGD) WITH PROPOFOL ?UPPER ENDOSCOPIC ULTRASOUND (EUS) LINEAR (Left) ?FINE NEEDLE ASPIRATION (FNA) LINEAR ? ?Patient Location: PACU ? ?Anesthesia Type:MAC ? ?Level of Consciousness: awake and drowsy ? ?Airway & Oxygen Therapy: Patient Spontanous Breathing and Patient connected to nasal cannula oxygen ? ?Post-op Assessment: Report given to RN, Post -op Vital signs reviewed and stable and Patient moving all extremities X 4 ? ?Post vital signs: Reviewed and stable ? ?Last Vitals:  ?Vitals Value Taken Time  ?BP 112/73   ?Temp    ?Pulse 66 05/14/21 0954  ?Resp 17 05/14/21 0954  ?SpO2 95 % 05/14/21 0954  ?Vitals shown include unvalidated device data. ? ?Last Pain:  ?Vitals:  ? 05/14/21 0755  ?TempSrc: Temporal  ?PainSc: 0-No pain  ?   ? ?Patients Stated Pain Goal: 0 (05/14/21 0320) ? ?Complications: No notable events documented. ?

## 2021-05-14 NOTE — Progress Notes (Signed)
PROGRESS NOTE    Tonya Jenkins  BWL:[id number] DOB: November 09, 1945 DOA: 05/11/2021 PCP: [name], MD    Chief Complaint  Patient presents with   Jaundice    Brief Narrative:   Tonya Jenkins is a 76 y.o. 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  Significant for painless obstructive jaundice, and pancreatic mass, initial attempt for ERCP were unsuccessful for stent placement, had ANTEROGRADE CHOLANGIOGRAM PERCUTANEOUS BILIARY DRAINAGE CATHETER PLACEMENT via CHOLECYSTOSTOMY ACCESS by IR on 3/9, she went for EUS 05/14/2021.    Assessment & Plan:   Principal Problem:   Obstructive jaundice secondary to pancreatic mass Active Problems:   Pancreatic mass   Possible AKI (acute kidney injury) ([place])   Normocytic anemia   Essential hypertension   Hypokalemia   Abnormal urinalysis   Hyperbilirubinemia   Elevated liver enzymes   Hyperglycemia   OSA on CPAP  Obstructive jaundice secondary to pancreatic mass - Patient presented due to concerns of jaundice with severe itching.  Significant elevated LFTs, concern for pancreatic malignancy . -GI input greatly appreciated, she went for ERCP 3/9 , with unsuccessful stent placement. - ANTEROGRADE CHOLANGIOGRAM PERCUTANEOUS BILIARY DRAINAGE CATHETER PLACEMENT via CHOLECYSTOSTOMY ACCESS by IR on 3/9, she went for EUS 05/14/2021. -Continue with IV Rocephin and Flagyl .  And had a retroperitoneal perforation during ERCP on 3/9. -Advance to regular diet. -Prelim cytology significant for adenocarcinoma, follow on CA 19-9 and final pathology results, oncology has been consulted.. -Total bilirubin trending down, continue to monitor LFTs closely.    AKI (acute kidney injury) ([place]) -Continue with IV fluids   Essential hypertension On admission blood pressures  elevated up to 166/95.  Home blood pressure regimen includes olmesartan 40 mg daily and Cardizem 180 mg nightly. -Continue Cardizem and pharmacy substitution for olmesartan   Normocytic anemia -Stable, continue to monitor    OSA on CPAP Patient reports using CPAP at night as prescribed. - Continue CPAP per RT at night   Hyperglycemia With A1c of 6.5, continue to monitor CBG closely with insulin sliding scale.     Elevated liver enzymes Secondary to above.  Total bili trending down.   Hyperbilirubinemia Secondary to above.   Abnormal urinalysis On admission patient was noted to have urinalysis with moderate hemoglobin, trace leukocytes, rare bacteria, and 6-10 WBCs. -Check urine culture   Hypokalemia Repleted       DVT prophylaxis: SCD , lovenox Code Status: Full Family Communication: D/W friend at bedside with patients permission Disposition:   Status is: Inpatient    Consultants:  IR GI    Subjective:  Ports she is feeling better today, she still complaining of abdominal pain  Objective: Vitals:   05/14/21 0736 05/14/21 0755 05/14/21 0955 05/14/21 1010  BP: 129/70 (!) 135/55 (!) 126/54 (!) 125/58  Pulse:  66 66 60  Resp: '20 17 17 14  '$ Temp: 98.6 F (37 C) (!) 97.5 F (36.4 C) 98.7 F (37.1 C) 98.7 F (37.1 C)  TempSrc: Oral Temporal    SpO2:  96% 95% 91%  Weight:      Height:        Intake/Output Summary (Last 24 hours) at 05/14/2021 1127 Last data filed at 05/14/2021 0325 Gross per 24 hour  Intake 2113.41 ml  Output 1000 ml  Net 1113.41 ml   [employer]  05/12/21 1700 05/13/21 0720  Weight: 67.2 kg 67.2 kg    Examination:  Awake Alert, Oriented X 3, frail, less jaundiced today Symmetrical Chest wall movement, Good air movement bilaterally, CTAB RRR,No Gallops,Rubs or new Murmurs, No Parasternal Heave +ve B.Sounds, Abd Soft, diffuse mild tenderness , No rebound - guarding or rigidity. No Cyanosis, Clubbing or edema, No new Rash or  bruise      Data Reviewed: I have personally reviewed following labs and imaging studies  CBC: Recent Labs  Lab 05/11/21 2030 05/13/21 0241 05/14/21 0159  WBC 6.6 6.3 13.3*  NEUTROABS 4.2  --   --   HGB 11.5* 10.3* 11.2*  HCT 34.7* 29.5* 32.8*  MCV 82.0 80.4 81.4  PLT 307 254 379    Basic Metabolic Panel: Recent Labs  Lab 05/11/21 2030 05/13/21 0241 05/14/21 0159  NA 134* 137 134*  K 3.3* 3.6 4.4  CL 98 106 101  CO2 26 21* 24  GLUCOSE 181* 147* 219*  BUN '19 16 16  '$ CREATININE 1.21* 1.14* 1.07*  CALCIUM 9.4 8.7* 8.3*    GFR: Estimated Creatinine Clearance: 38.9 mL/min (A) (by C-G formula based on SCr of 1.07 mg/dL (H)).  Liver Function Tests: Recent Labs  Lab 05/11/21 2030 05/13/21 0241 05/14/21 0159  AST 686* 392* 420*  ALT 1,242* 856* 915*  ALKPHOS 402* 336* 340*  BILITOT 8.7* 8.2* 5.1*  PROT 7.1 5.7* 5.6*  ALBUMIN 3.6 2.9* 2.7*    CBG: Recent Labs  Lab 05/13/21 0946 05/13/21 1931 05/13/21 2311 05/14/21 0325 05/14/21 0725  GLUCAP 153* 190* 191* 230* 188*     Recent Results (from the past 240 hour(s))  Resp Panel by RT-PCR (Flu A&B, Covid) Nasopharyngeal Swab     Status: None   Collection Time: 05/12/21  4:37 AM   Specimen: Nasopharyngeal Swab; Nasopharyngeal(NP) swabs in vial transport medium  Result Value Ref Range Status   SARS Coronavirus 2 by RT PCR NEGATIVE NEGATIVE Final    Comment: (NOTE) SARS-CoV-2 target nucleic acids are NOT DETECTED.  The SARS-CoV-2 RNA is generally detectable in upper respiratory specimens during the acute phase of infection. The lowest concentration of SARS-CoV-2 viral copies this assay can detect is 138 copies/mL. A negative result does not preclude SARS-Cov-2 infection and should not be used as the sole basis for treatment or other patient management decisions. A negative result may occur with  improper specimen collection/handling, submission of specimen other than nasopharyngeal swab, presence of viral  mutation(s) within the areas targeted by this assay, and inadequate number of viral copies(<138 copies/mL). A negative result must be combined with clinical observations, patient history, and epidemiological information. The expected result is Negative.  Fact Sheet for Patients:  EntrepreneurPulse.com.au  Fact Sheet for Healthcare Providers:  IncredibleEmployment.be  This test is no t yet approved or cleared by the [place] FDA and  has been authorized for detection and/or diagnosis of SARS-CoV-2 by FDA under an Emergency Use Authorization (EUA). This EUA will remain  in effect (meaning this test can be used) for the duration of the COVID-19 declaration under Section 564(b)(1) of the Act, 21 U.S.C.section 360bbb-3(b)(1), unless the authorization is terminated  or revoked sooner.       Influenza A by PCR NEGATIVE NEGATIVE Final   Influenza B by PCR NEGATIVE NEGATIVE Final    Comment: (NOTE) The Xpert Xpress SARS-CoV-2/FLU/RSV plus assay is intended as an aid in the diagnosis of influenza from Nasopharyngeal swab specimens and should not be used as a sole  basis for treatment. Nasal washings and aspirates are unacceptable for Xpert Xpress SARS-CoV-2/FLU/RSV testing.  Fact Sheet for Patients: EntrepreneurPulse.com.au  Fact Sheet for Healthcare Providers: IncredibleEmployment.be  This test is not yet approved or cleared by the [place] FDA and has been authorized for detection and/or diagnosis of SARS-CoV-2 by FDA under an Emergency Use Authorization (EUA). This EUA will remain in effect (meaning this test can be used) for the duration of the COVID-19 declaration under Section 564(b)(1) of the Act, 21 U.S.C. section 360bbb-3(b)(1), unless the authorization is terminated or revoked.  Performed at [facility], [place] [address] [place], [place] [address]   Urine Culture     Status: Abnormal    Collection Time: 05/12/21 11:18 AM   Specimen: Urine, Random  Result Value Ref Range Status   Specimen Description URINE, RANDOM  Final   Special Requests   Final    Immunocompromised Performed at [facility], [place] [address], [place], [place] [address]    Culture MULTIPLE SPECIES PRESENT, SUGGEST RECOLLECTION (A)  Final   Report Status 05/13/2021 FINAL  Final  Aerobic/Anaerobic Culture w Gram Stain (surgical/deep wound)     Status: None (Preliminary result)   Collection Time: 05/13/21  5:27 PM   Specimen: Gallbladder; Bile  Result Value Ref Range Status   Specimen Description GALL BLADDER  Final   Special Requests NONE  Final   Gram Stain   Final    NO SQUAMOUS EPITHELIAL CELLS SEEN NO WBC SEEN NO ORGANISMS SEEN    Culture   Final    NO GROWTH < 12 HOURS Performed at [facility], [place] [address] [place], [place] [address]    Report Status PENDING  Incomplete         Radiology Studies: DG ERCP  Result Date: 05/13/2021 CLINICAL DATA:  Biliary obstruction. EXAM: ERCP COMPARISON:  CT AP, 05/12/2021 and 07/21/2020.  MRCP, 05/12/2021. FLUOROSCOPY: Exposure Index (as provided by the fluoroscopic device): 37 mGy Kerma FINDINGS: Limited oblique planar images of the RIGHT upper quadrant obtained C-arm. Images demonstrating flexible endoscopy, attempted biliary duct cannulation and retrograde cholangiogram. IMPRESSION: Fluoroscopic imaging for ERCP. No discrete retrograde biliary opacification For complete description of intra procedural findings, please see performing service dictation. Electronically Signed   By: Michaelle Birks M.D.   On: 05/13/2021 09:40        Scheduled Meds:  cholestyramine  4 g Oral BID   diltiazem  180 mg Oral QHS   enoxaparin (LOVENOX) injection  40 mg Subcutaneous Q24H   irbesartan  300 mg Oral Daily   melatonin  3 mg Oral QHS   sodium chloride flush  10 mL Intracatheter Q12H   sodium chloride flush  10 mL Intracatheter Q12H   sodium  chloride flush  3 mL Intravenous Q12H   Continuous Infusions:  sodium chloride 75 mL/hr at 05/14/21 0534   ciprofloxacin Stopped (05/13/21 2342)   metronidazole 500 mg (05/14/21 0556)     LOS: 2 days       Phillips Climes, MD [facility]   To contact the attending provider between 7A-7P or the covering provider during after hours 7P-7A, please log into the web site [email or web address] and access using universal [place] password for that web site. If you do not have the password, please call the hospital operator.  05/14/2021, 11:27 AM

## 2021-05-14 NOTE — Progress Notes (Signed)
Pt picked up for procedure, pt on room air and tele, able to ambulate with no distress, family at bedside updated. ?

## 2021-05-14 NOTE — Anesthesia Preprocedure Evaluation (Signed)
Anesthesia Evaluation  ?Patient identified by MRN, date of birth, ID band ?Patient awake ? ? ? ?Reviewed: ?Allergy & Precautions, NPO status , Patient's Chart, lab work & pertinent test results ? ?History of Anesthesia Complications ?Negative for: history of anesthetic complications ? ?Airway ?Mallampati: II ? ?TM Distance: >3 FB ?Neck ROM: Full ? ? ? Dental ?  ?Pulmonary ?sleep apnea and Continuous Positive Airway Pressure Ventilation ,  ?  ?Pulmonary exam normal ? ? ? ? ? ? ? Cardiovascular ?hypertension, Pt. on medications ?Normal cardiovascular exam ? ? ?  ?Neuro/Psych ?negative neurological ROS ?   ? GI/Hepatic ?Neg liver ROS, Pancreatic mass ?  ?Endo/Other  ?negative endocrine ROS ? Renal/GU ?Renal InsufficiencyRenal disease  ?negative genitourinary ?  ?Musculoskeletal ? ?(+) Arthritis ,  ? Abdominal ?  ?Peds ? Hematology ? ?(+) Blood dyscrasia, anemia ,   ?Anesthesia Other Findings ? ? Reproductive/Obstetrics ? ?  ? ? ? ? ? ? ? ? ? ? ? ? ? ?  ?  ? ? ? ? ? ? ? ?Anesthesia Physical ?Anesthesia Plan ? ?ASA: 3 ? ?Anesthesia Plan: MAC  ? ?Post-op Pain Management: Minimal or no pain anticipated  ? ?Induction: Intravenous ? ?PONV Risk Score and Plan: 2 and Propofol infusion, TIVA and Treatment may vary due to age or medical condition ? ?Airway Management Planned: Natural Airway, Nasal Cannula and Simple Face Mask ? ?Additional Equipment: None ? ?Intra-op Plan:  ? ?Post-operative Plan:  ? ?Informed Consent: I have reviewed the patients History and Physical, chart, labs and discussed the procedure including the risks, benefits and alternatives for the proposed anesthesia with the patient or authorized representative who has indicated his/her understanding and acceptance.  ? ? ? ? ? ?Plan Discussed with:  ? ?Anesthesia Plan Comments:   ? ? ? ? ? ? ?Anesthesia Quick Evaluation ? ?

## 2021-05-15 ENCOUNTER — Encounter (HOSPITAL_COMMUNITY): Payer: Self-pay

## 2021-05-15 DIAGNOSIS — K831 Obstruction of bile duct: Secondary | ICD-10-CM | POA: Diagnosis not present

## 2021-05-15 DIAGNOSIS — R748 Abnormal levels of other serum enzymes: Secondary | ICD-10-CM | POA: Diagnosis not present

## 2021-05-15 LAB — CBC
HCT: 29.4 % — ABNORMAL LOW (ref 36.0–46.0)
Hemoglobin: 10.1 g/dL — ABNORMAL LOW (ref 12.0–15.0)
MCH: 28.3 pg (ref 26.0–34.0)
MCHC: 34.4 g/dL (ref 30.0–36.0)
MCV: 82.4 fL (ref 80.0–100.0)
Platelets: 228 10*3/uL (ref 150–400)
RBC: 3.57 MIL/uL — ABNORMAL LOW (ref 3.87–5.11)
RDW: 18 % — ABNORMAL HIGH (ref 11.5–15.5)
WBC: 13 10*3/uL — ABNORMAL HIGH (ref 4.0–10.5)
nRBC: 0 % (ref 0.0–0.2)

## 2021-05-15 LAB — HEPATIC FUNCTION PANEL
ALT: 593 U/L — ABNORMAL HIGH (ref 0–44)
AST: 162 U/L — ABNORMAL HIGH (ref 15–41)
Albumin: 2.4 g/dL — ABNORMAL LOW (ref 3.5–5.0)
Alkaline Phosphatase: 241 U/L — ABNORMAL HIGH (ref 38–126)
Bilirubin, Direct: 1.7 mg/dL — ABNORMAL HIGH (ref 0.0–0.2)
Indirect Bilirubin: 1.6 mg/dL — ABNORMAL HIGH (ref 0.3–0.9)
Total Bilirubin: 3.3 mg/dL — ABNORMAL HIGH (ref 0.3–1.2)
Total Protein: 5.3 g/dL — ABNORMAL LOW (ref 6.5–8.1)

## 2021-05-15 LAB — GLUCOSE, CAPILLARY
Glucose-Capillary: 119 mg/dL — ABNORMAL HIGH (ref 70–99)
Glucose-Capillary: 158 mg/dL — ABNORMAL HIGH (ref 70–99)
Glucose-Capillary: 111 mg/dL — ABNORMAL HIGH (ref 70–99)
Glucose-Capillary: 118 mg/dL — ABNORMAL HIGH (ref 70–99)

## 2021-05-15 LAB — BASIC METABOLIC PANEL
Anion gap: 8 (ref 5–15)
BUN: 19 mg/dL (ref 8–23)
CO2: 24 mmol/L (ref 22–32)
Calcium: 8.2 mg/dL — ABNORMAL LOW (ref 8.9–10.3)
Chloride: 103 mmol/L (ref 98–111)
Creatinine, Ser: 1.12 mg/dL — ABNORMAL HIGH (ref 0.44–1.00)
GFR, Estimated: 51 mL/min — ABNORMAL LOW (ref >60)
Glucose, Bld: 189 mg/dL — ABNORMAL HIGH (ref 70–99)
Potassium: 3.6 mmol/L (ref 3.5–5.1)
Sodium: 135 mmol/L (ref 135–145)

## 2021-05-15 LAB — LACTATE DEHYDROGENASE: LDH: 126 U/L (ref 98–192)

## 2021-05-15 LAB — CANCER ANTIGEN 19-9: CA 19-9: <2 U/mL (ref 0–35)

## 2021-05-15 NOTE — Progress Notes (Signed)
Referring Physician(s): Dr. Benson Norway  Supervising Physician: Markus Daft  Patient Status:  [facility] - In-pt  Chief Complaint:  Biliary obstruction   Brief History:  Tonya Jenkins is a 76 y.o. female who presented to ED 05/11/21 c/o jaundice, fatigue, abdominal pain, diarrhea, white stools and pruritis.   Labs showed abnormal LFTs, hyperbilirubinemia, hypokalemia and lesion to the head of the pancreas obstructing the pancreatic and common bile duct.   GI was consulted and attempted ERCP but failed d/t inability to cannulate.    MR Abd 05/11/21 showed= A 3.6 x 2.1 x 4.2 cm enhancing lesion in the head of the pancreas obstructing the pancreatic duct and common bile duct highly concerning for primary pancreatic neoplasm.   She underwent image guided biliary drain placement by Dr. Maryelizabeth Kaufmann yesterday.  Tbili down to 3.3   Subjective:  Doing ok. No complaints.  Allergies: Amlodipine besylate, Hydrochlorothiazide, Lexapro [escitalopram oxalate], Oxycodone, and Penicillins  Medications: Prior to Admission medications   Medication Sig Start Date End Date Taking? Authorizing Provider  B Complex Vitamins (VITAMIN B COMPLEX PO) Take 1 capsule by mouth daily.   Yes [provider]  diltiazem (DILACOR XR) 180 MG 24 hr capsule Take 180 mg by mouth at bedtime. 04/27/21  Yes [provider]  melatonin 3 MG TABS tablet Take 3 mg by mouth at bedtime.   Yes [provider]  NON FORMULARY CPAP at bedtime   Yes [provider]  olmesartan (BENICAR) 40 MG tablet Take 40 mg by mouth at bedtime. 03/17/20  Yes [provider]  triamcinolone lotion (KENALOG) 0.1 % Apply 0.1 application. topically See admin instructions. Bid x 7 days 05/06/21  Yes [provider]  hydrOXYzine (ATARAX) 25 MG tablet Take 25 mg by mouth every 8 (eight) hours as needed for anxiety or itching. 05/06/21   [provider]     Vital Signs: BP 133/64    Pulse 66    Temp 98.7  F (37.1 C) (Oral)    Resp (!) 22    Ht 5' (1.524 m)    Wt 148 lb 2.4 oz (67.2 kg)    SpO2 92%    BMI 28.93 kg/m   Physical Exam Vitals reviewed.  Constitutional:      Appearance: Normal appearance.  Cardiovascular:     Rate and Rhythm: Normal rate.  Pulmonary:     Effort: Pulmonary effort is normal. No respiratory distress.  Abdominal:     Comments: Drain Location: RUQ Size: Fr size: 10 Fr Date of placement: 05/14/21 Currently to: Drain collection device: gravity 24 hour output: 1,090 mL bilious  Flushes/aspirates easily.  Insertion site unremarkable. Suture and stat lock in place. Dressed appropriately.   Neurological:     General: No focal deficit present.     Mental Status: She is alert and oriented to person, place, and time.  Psychiatric:        Mood and Affect: Mood normal.        Behavior: Behavior normal.        Thought Content: Thought content normal.        Judgment: Judgment normal.    Imaging: CT CHEST WO CONTRAST  Result Date: 05/14/2021 CLINICAL DATA:  Newly diagnosed pancreatic carcinoma.  Staging. * onc * EXAM: CT CHEST WITHOUT CONTRAST TECHNIQUE: Multidetector CT imaging of the chest was performed following the standard protocol without IV contrast. RADIATION DOSE REDUCTION: This exam was performed according to the departmental dose-optimization program which includes automated exposure  control, adjustment of the mA and/or kV according to patient size and/or use of iterative reconstruction technique. COMPARISON:  None. FINDINGS: Cardiovascular: No acute findings. Aortic atherosclerotic calcification noted. Mediastinum/Nodes: No masses or pathologically enlarged lymph nodes identified on this unenhanced exam. Lungs/Pleura: Small right pleural effusion is seen with compressive atelectasis in right lower lobe. No suspicious pulmonary nodules or masses are identified. No evidence of central endobronchial obstruction. Right hilar lymph node calcification and right lower  lobe calcified granuloma, consistent with old granulomatous disease. Mild pleural-parenchymal scarring in lateral left lower lobe. Upper Abdomen:  Internal external biliary drain noted. Musculoskeletal:  No suspicious bone lesions. IMPRESSION: No evidence of metastatic disease within the thorax. Small right pleural effusion and right lower lobe compressive atelectasis. Aortic Atherosclerosis (ICD10-I70.0). Electronically Signed   By: Marlaine Hind M.D.   On: 05/14/2021 20:27   US Abdomen Complete  Result Date: 05/11/2021 CLINICAL DATA:  Left upper quadrant tenderness. EXAM: ABDOMEN ULTRASOUND COMPLETE COMPARISON:  CT 07/21/2020 FINDINGS: Gallbladder: Distended. No gallstones or wall thickening visualized. No sonographic Murphy sign noted by sonographer. Common bile duct: Diameter: 7 mm proximally, flares to 10 mm distally Liver: No focal lesion identified. Increased and heterogeneous in parenchymal echogenicity. No capsular nodularity. Portal vein is patent on color Doppler imaging with normal direction of blood flow towards the liver. IVC: No abnormality visualized. Pancreas: Dilated pancreatic duct at 10 mm. No pancreatic mass is seen. Spleen: Normal in size. No focal sonographic abnormality. Calcified granuloma on CT are not seen. Right Kidney: Length: 9.8 cm. Suggestion of mild hydronephrosis. No visualized renal calculi. Lower pole cyst on prior CT is not seen by ultrasound. Left Kidney: Length: 9.5 cm. No hydronephrosis. No visualized renal calculi. No focal renal abnormality. Abdominal aorta: No aneurysm visualized. Other findings: No abdominal ascites. IMPRESSION: 1. Dilatation of the common bile duct and pancreatic ducts. This can be seen in the setting of pancreatic head mass, although no focal pancreatic lesion is seen by ultrasound. Recommend further assessment with created protocol MRI, or contrast-enhanced abdominopelvic CT if patient cannot tolerate breath hold technique. 2. Mild right  hydronephrosis, of unknown etiology. 3. Hepatic steatosis. Electronically Signed   By: Keith Rake M.D.   On: 05/11/2021 21:25   CT ABDOMEN PELVIS W CONTRAST  Result Date: 05/12/2021 CLINICAL DATA:  Abdominal pain. EXAM: CT ABDOMEN AND PELVIS WITH CONTRAST TECHNIQUE: Multidetector CT imaging of the abdomen and pelvis was performed using the standard protocol following bolus administration of intravenous contrast. RADIATION DOSE REDUCTION: This exam was performed according to the departmental dose-optimization program which includes automated exposure control, adjustment of the mA and/or kV according to patient size and/or use of iterative reconstruction technique. CONTRAST:  78m OMNIPAQUE IOHEXOL 300 MG/ML  SOLN COMPARISON:  Jul 21, 2020 FINDINGS: Lower chest: No acute abnormality. Hepatobiliary: No focal liver abnormality is seen. Marked severity intrahepatic biliary dilatation is noted. The gallbladder is markedly distended without evidence of gallstones are gallbladder wall thickening. The common bile duct is dilated and measures approximately 13 mm in diameter. Abrupt narrowing of the common bile duct is seen at the level of the pancreatic head. (Axial CT images 28 through 31, CT series 3. This represents a new finding when compared to the prior study. Pancreas: Pancreatic duct is also dilated (12 mm in diameter) with abrupt narrowing seen within the pancreatic head (axial CT images 30 through 32, CT series 3). This represents a new finding when compared to the prior exam. Mild prominence of the pancreatic head  is noted without an underlying mass lesion. No peripancreatic inflammatory fat stranding is seen. Spleen: Normal in size without focal abnormality. Adrenals/Urinary Tract: Adrenal glands are unremarkable. Kidneys are normal in size, without renal calculi or hydronephrosis. Small bilateral simple renal cysts are noted. Bladder is unremarkable. Stomach/Bowel: Stomach is within normal limits.  Appendix appears normal. No evidence of bowel wall thickening, distention, or inflammatory changes. Noninflamed diverticula are seen throughout the large bowel. Vascular/Lymphatic: Aortic atherosclerosis. No enlarged abdominal or pelvic lymph nodes. Reproductive: Uterus and bilateral adnexa are unremarkable. Other: There is a 2.1 cm 1.0 cm fat containing umbilical hernia. No abdominopelvic ascites. Musculoskeletal: Multilevel degenerative changes are noted throughout the lumbar spine. IMPRESSION: 1. Marked severity intrahepatic and extrahepatic biliary dilatation with an abrupt narrowing of the common bile duct and pancreatic duct at the level of the pancreatic head. Further correlation with MRCP is recommended. 2. Markedly distended gallbladder without evidence of cholelithiasis or acute cholecystitis. 3. Colonic diverticulosis. 4. Small bilateral simple renal cysts. 5. 2.1 cm fat containing umbilical hernia. 6. Aortic atherosclerosis. Aortic Atherosclerosis (ICD10-I70.0). Electronically Signed   By: Virgina Norfolk M.D.   On: 05/12/2021 01:23   DG ERCP  Result Date: 05/13/2021 CLINICAL DATA:  Biliary obstruction. EXAM: ERCP COMPARISON:  CT AP, 05/12/2021 and 07/21/2020.  MRCP, 05/12/2021. FLUOROSCOPY: Exposure Index (as provided by the fluoroscopic device): 37 mGy Kerma FINDINGS: Limited oblique planar images of the RIGHT upper quadrant obtained C-arm. Images demonstrating flexible endoscopy, attempted biliary duct cannulation and retrograde cholangiogram. IMPRESSION: Fluoroscopic imaging for ERCP. No discrete retrograde biliary opacification For complete description of intra procedural findings, please see performing service dictation. Electronically Signed   By: Michaelle Birks M.D.   On: 05/13/2021 09:40   MR ABDOMEN MRCP W WO CONTAST  Result Date: 05/12/2021 CLINICAL DATA:  76 year old female with history of jaundice and abdominal pain. Biliary obstruction noted on recent CT examination. Follow-up study.  EXAM: MRI ABDOMEN WITHOUT AND WITH CONTRAST (INCLUDING MRCP) TECHNIQUE: Multiplanar multisequence MR imaging of the abdomen was performed both before and after the administration of intravenous contrast. Heavily T2-weighted images of the biliary and pancreatic ducts were obtained, and three-dimensional MRCP images were rendered by post processing. CONTRAST:  81m GADAVIST GADOBUTROL 1 MMOL/ML IV SOLN COMPARISON:  No prior abdominal MRI. CT of the abdomen and pelvis 05/12/2021. FINDINGS: Comment: Portions of today's examination are limited by considerable patient respiratory motion. Lower chest: Unremarkable. Hepatobiliary: Within the limitations of today's examination, there are no definite suspicious cystic or solid hepatic lesions. Severe intra and extrahepatic biliary ductal dilatation is noted. Gallbladder is moderately distended. Gallbladder wall thickness is normal. No filling defects to suggest gallstones. No definite pericholecystic fluid. No filling defects within the common bile duct to suggest choledocholithiasis. Common bile duct measures up to 14 mm in the porta hepatis. There is abrupt cut off of the distal common bile duct in the region of the pancreatic head. Pancreas: MRCP images demonstrate severe diffuse pancreatic ductal dilatation measuring up to 8 mm in the pancreatic body. In the pancreatic head there is a enhancing mass-like area which is poorly delineated on today's motion limited examination, but estimated to measure approximately 3.6 x 2.1 x 4.2 cm (axial image 55 of series 22 and coronal image 51 of series 31), concerning for pancreatic neoplasm. This causes abrupt cut off of the distal pancreatic duct, as well as the distal common bile duct. No peripancreatic fluid collections or inflammatory changes. Spleen:  Unremarkable. Adrenals/Urinary Tract: Multiple small T1 hypointense, T2  hyperintense, nonenhancing lesions in both kidneys are compatible with tiny simple cysts. No  hydroureteronephrosis in the visualized portions of the abdomen. Bilateral adrenal glands are normal in appearance. Stomach/Bowel: Visualized portions are unremarkable. Vascular/Lymphatic: No aneurysm identified in the visualized abdominal vasculature. No lymphadenopathy noted in the abdomen Other: No significant volume of ascites noted in the visualized portions of the peritoneal cavity. Musculoskeletal: No aggressive appearing osseous lesions are noted in the visualized portions of the skeleton. IMPRESSION: 1. 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 Further evaluation with endoscopic ultrasound is strongly recommended at this time. Electronically Signed   By: Vinnie Langton M.D.   On: 05/12/2021 07:05   IR INT EXT BILIARY DRAIN WITH CHOLANGIOGRAM  Result Date: 05/14/2021 INDICATION: Pancreatic mass.  Malignant biliary obstruction. EXAM: Procedures: 1. ANTEROGRADE CHOLANGIOGRAM 2. PERCUTANEOUS TRANSHEPATIC BILIARY DRAINAGE CATHETER PLACEMENT VIA CHOLECYSTOSTOMY ACCESS Performing Physician: Michaelle Birks, MD Assistant(s): Daryll Brod, MD A qualified trainee/resident or advanced practice provider (APP) was not immediately available to assist with this case. COMPARISON:  ERCP, earlier same day. MRI abdomen and CT AP, 05/12/2021. US Abdomen, 05/11/2021. MEDICATIONS: The patient was on scheduled IV antibiotics, including ciprofloxacin and Flagyl. CONTRAST:  62m OMNIPAQUE IOHEXOL 300 MG/ML SOLN - administered into the biliary tree. ANESTHESIA/SEDATION: Moderate (conscious) sedation was employed during this procedure. A total of Versed 4 mg and Fentanyl 200 mcg was administered intravenously. Moderate Sedation Time: 91 minutes. The patient's level of consciousness and vital signs were monitored continuously by radiology nursing throughout the procedure under my direct supervision.  FLUOROSCOPY TIME:  32 minutes, 6 seconds. Fluoroscopic dose; 177 mGy. COMPLICATIONS: None immediate. TECHNIQUE: Informed written consent was obtained from the patient after a discussion of the risks, benefits and alternatives to treatment. Questions regarding the procedure were encouraged and answered. A timeout was performed prior to the initiation of the procedure. The right upper abdominal quadrant was prepped and draped in the usual sterile fashion, and a sterile drape was applied covering the operative field. Maximum barrier sterile technique with sterile gowns and gloves were used for the procedure. A timeout was performed prior to the initiation of the procedure. Ultrasound scanning of the right upper quadrant demonstrates a markedly dilated gallbladder. Utilizing a transhepatic approach, a 22 gauge needle was advanced into the gallbladder under direct ultrasound guidance. An ultrasound image was saved for documentation purposes. Appropriate intraluminal puncture was confirmed with the efflux of bile and advancement of an 0.018 wire into the gallbladder lumen. The needle was exchanged for an AAtlanticset. A small amount of contrast was injected to confirm appropriate intraluminal positioning. Over a Benson wire, a 5 Fr, 35 cm Brite tip sheath was advanced into the gallbladder and positioned at the gallbladder neck/cystic duct. Using a 0.018 inch V18 Glidewire and 90 cm Navicross catheter access through the cystic duct into the common bile duct, then finally past the obstruction and into the duodenum was obtained. Transpapillary anchoring with an 0.035 inch Amplatz wire was placed. Over the wire, a 10 Fr internal/external biliary catheter was advanced over wire, across the obstruction, with coil ultimately locked within the duodenum. Contrast was injected and a completion radiographs were obtained. The catheter was connected to a drainage bag which yielded the brisk return of bile. The catheter was secured to  the skin with an interrupted suture and StatLock device. Dressings were applied. The patient tolerated the procedure  well without immediate postprocedural complication. FINDINGS: Sonographic evaluation of the liver demonstrates marked intrahepatic biliary ductal dilatation as was demonstrated on preceding abdominal CT. Under direct ultrasound guidance, a dilated gallbladder was accessed via transhepatic approach then cannulation across the cystic duct was performed, allowing for placement of a 10 [place] internalized/externalized biliary drainage catheter with end ultimately coiled and locked within the duodenum. Transpapillary access into duodenum after considerable effort. Antegrade cholangiogram demonstrates marked dilatation of the CBD and intrahepatic biliary tree with communication between the right and left biliary trees at the level of the hilum.Distal obstruction resulting in flush occlusion on cholangiogram. IMPRESSION: Successful placement of a 10 Fr percutaneous internalized/externalized biliary drainage catheter via cholecystostomy access, with catheter end coiled within the duodenum. PLAN: 1. Flush tube w 10 mL sterile NS and record drain output qShift. 2. Follow up for routine tube exchange in 2 month(s). Michaelle Birks, MD [facility] Electronically Signed   By: Michaelle Birks M.D.   On: 05/14/2021 21:06    Labs:  CBC: Recent Labs    05/11/21 2030 05/13/21 0241 05/14/21 0159 05/15/21 0045  WBC 6.6 6.3 13.3* 13.0*  HGB 11.5* 10.3* 11.2* 10.1*  HCT 34.7* 29.5* 32.8* 29.4*  PLT 307 254 267 228    COAGS: Recent Labs    05/12/21 0343  INR 0.9  APTT 27    BMP: Recent Labs    05/11/21 2030 05/13/21 0241 05/14/21 0159 05/15/21 0045  NA 134* 137 134* 135  K 3.3* 3.6 4.4 3.6  CL 98 106 101 103  CO2 26 21* 24 24  GLUCOSE 181* 147* 219* 189*  BUN '19 16 16 19  '$ CALCIUM 9.4 8.7* 8.3* 8.2*  CREATININE 1.21* 1.14* 1.07* 1.12*   GFRNONAA 47* 50* 54* 51*    LIVER FUNCTION TESTS: Recent Labs    05/11/21 2030 05/13/21 0241 05/14/21 0159 05/15/21 0045  BILITOT 8.7* 8.2* 5.1* 3.3*  AST 686* 392* 420* 162*  ALT 1,242* 856* 915* 593*  ALKPHOS 402* 336* 340* 241*  PROT 7.1 5.7* 5.6* 5.3*  ALBUMIN 3.6 2.9* 2.7* 2.4*    Assessment and Plan:  Biliary obstruction secondary to pancreatic mass.  S/P biliary drain placement by Dr. Maryelizabeth Kaufmann 05/14/21  Continue flushes as ordered with 5 cc NS. Record output Q shift. Dressing changes QD or PRN if soiled.  Call IR APP or on call IR MD if difficulty flushing or sudden change in drain output.   Plan to return for drain exchange in 2 months per Dr. Maryelizabeth Kaufmann.  Order placed for schedulers to arrange.   If patient stays inpatient with further decompression she could potentially initiate capping trial, but will hold on this as appears she may be discharging soon.   Recommend lab work per PCP or GI within the next week to monitor electrolytes and trend hepatic function.   Electronically Signed: Murrell Redden, PA-C 05/15/2021, 12:07 PM    I spent a total of 15 Minutes at the the patient's bedside AND on the patient's hospital floor or unit, greater than 50% of which was counseling/coordinating care for f/u biliary drain.

## 2021-05-15 NOTE — Progress Notes (Signed)
Physical Therapy Treatment ?Patient Details ?Name: Tonya Jenkins ?MRN: [id number] ?DOB: [birth year] ?Today's Date: 05/15/2021 ? ? ?History of Present Illness 76 y/o female presented to ED on 05/11/21 for jaundice. Found to have significant elevated LFTs, concern for pancreatic malignancy. 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 PMH: HTN, OSA on CPAP ? ?  ?PT Comments  ? ? Pt was sore this afternoon and struggled to mobilize without rails and HOB raised plus occasional assist.  Emphasis on transitions, sit to stand, amb first with cane, but noting pt needed a RW/rollator which was not available at the time. ?  ?Recommendations for follow up therapy are one component of a multi-disciplinary discharge planning process, led by the attending physician.  Recommendations may be updated based on patient status, additional functional criteria and insurance authorization. ? ?Follow Up Recommendations ? Other (comment) (PCA short term (maximized services)) ?  ?  ?Assistance Recommended at Discharge Set up Supervision/Assistance  ?Patient can return home with the following   ?  ?Equipment Recommendations ? BSC/3in1;Hospital bed (hospital bed for short term, at the very least a rail for her bed.)  ?  ?Recommendations for Other Services   ? ? ?  ?Precautions / Restrictions Precautions ?Precautions: Fall ?Precaution Comments: drain on R side ?Restrictions ?Weight Bearing Restrictions: No  ?  ? ?Mobility ? Bed Mobility ?Overal bed mobility: Needs Assistance ?Bed Mobility: Rolling, Sidelying to Sit, Sit to Sidelying ?Rolling: Supervision, Min guard ?Sidelying to sit: Min guard ?  ?  ?Sit to sidelying: Min guard ?General bed mobility comments: pt struggled with transition side to sitting and forgot to use side to sit without cuing.  pt would benefit from hospital bed for short period. ?  ? ?Transfers ?Overall transfer level: Needs assistance ?Equipment used: Straight  cane ?Transfers: Sit to/from Stand ?Sit to Stand: Min guard ?  ?  ?  ?  ?  ?General transfer comment: no assist needed, used UE's appropriately, but did not look confident with cane only. ?  ? ?Ambulation/Gait ?Ambulation/Gait assistance: Min guard, Min assist ?Gait Distance (Feet): 150 Feet ?Assistive device: Straight cane, IV Pole ?Gait Pattern/deviations: Step-through pattern, Decreased stride length ?Gait velocity: decreased ?Gait velocity interpretation: <1.8 ft/sec, indicate of risk for recurrent falls ?  ?General Gait Details: With cane, pt unable to amb confidently, want HHA or use of the IV pole.  No rollator available.  Min stability assist on occasion ? ? ?Stairs ?  ?  ?  ?  ?  ? ? ?Wheelchair Mobility ?  ? ?Modified Rankin (Stroke Patients Only) ?  ? ? ?  ?Balance Overall balance assessment: Mild deficits observed, not formally tested ?  ?  ?  ?  ?  ?  ?  ?  ?  ?  ?  ?  ?  ?  ?  ?  ?  ?  ?  ? ?  ?Cognition Arousal/Alertness: Awake/alert ?Behavior During Therapy: [facility] for tasks assessed/performed ?Overall Cognitive Status: Within Functional Limits for tasks assessed ?  ?  ?  ?  ?  ?  ?  ?  ?  ?  ?  ?  ?  ?  ?  ?  ?  ?  ?  ? ?  ?Exercises   ? ?  ?General Comments General comments (skin integrity, edema, etc.): vss ?  ?  ? ?Pertinent Vitals/Pain Pain Assessment ?Pain Assessment: Faces ?Faces Pain Scale: Hurts even more (  with movement.) ?Pain Location: R abdomen ?Pain Descriptors / Indicators: Discomfort, Grimacing, Guarding ?Pain Intervention(s): Monitored during session  ? ? ?Home Living   ?  ?  ?  ?  ?  ?  ?  ?  ?  ?   ?  ?Prior Function    ?  ?  ?   ? ?PT Goals (current goals can now be found in the care plan section) Acute Rehab PT Goals ?PT Goal Formulation: With patient ?Time For Goal Achievement: 05/28/21 ?Potential to Achieve Goals: Good ?Progress towards PT goals: Progressing toward goals ? ?  ?Frequency ? ? ? Min 3X/week ? ? ? ?  ?PT Plan Current plan remains appropriate  ? ? ?Co-evaluation   ?  ?   ?  ?  ? ?  ?AM-PAC PT "6 Clicks" Mobility   ?Outcome Measure ? Help needed turning from your back to your side while in a flat bed without using bedrails?: A Little ?Help needed moving from lying on your back to sitting on the side of a flat bed without using bedrails?: A Little ?Help needed moving to and from a bed to a chair (including a wheelchair)?: A Little ?Help needed standing up from a chair using your arms (e.g., wheelchair or bedside chair)?: A Little ?Help needed to walk in hospital room?: A Little ?Help needed climbing 3-5 steps with a railing? : A Little ?6 Click Score: 18 ? ?  ?End of Session   ?Activity Tolerance: Patient tolerated treatment well ?Patient left: in bed;with call bell/phone within reach;with family/visitor present ?Nurse Communication: Mobility status ?PT Visit Diagnosis: Other abnormalities of gait and mobility (R26.89) ?  ? ? ?Time: 3664-4034 ?PT Time Calculation (min) (ACUTE ONLY): 24 min ? ?Charges:  $Gait Training: 8-22 mins ?$Therapeutic Activity: 8-22 mins          ?          ? ?05/15/2021 ? ?Ginger Carne., PT ?Acute Rehabilitation Services ?[phone]  (pager) ?[phone]  (office) ? ? ?[name] ?05/15/2021, 5:30 PM ? ?

## 2021-05-15 NOTE — Progress Notes (Signed)
PROGRESS NOTE    Tonya Jenkins  EQA:[id number] DOB: 1945/09/25 DOA: 05/11/2021 PCP: [name], MD    Chief Complaint  Patient presents with   Jaundice    Brief Narrative:   Tonya Jenkins is a 76 y.o. 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  Significant for painless obstructive jaundice, and pancreatic mass, initial attempt for ERCP were unsuccessful for stent placement, had ANTEROGRADE CHOLANGIOGRAM PERCUTANEOUS BILIARY DRAINAGE CATHETER PLACEMENT via CHOLECYSTOSTOMY ACCESS by IR on 3/9, she went for EUS 05/14/2021.    Assessment & Plan:   Principal Problem:   Obstructive jaundice secondary to pancreatic mass Active Problems:   Pancreatic mass   Possible AKI (acute kidney injury) ([place])   Normocytic anemia   Essential hypertension   Hypokalemia   Abnormal urinalysis   Hyperbilirubinemia   Elevated liver enzymes   Hyperglycemia   OSA on CPAP  Obstructive jaundice secondary to pancreatic mass - Patient presented due to concerns of jaundice with severe itching.  Significant elevated LFTs, concern for pancreatic malignancy . -GI input greatly appreciated, she went for ERCP 3/9 , with unsuccessful stent placement. - ANTEROGRADE CHOLANGIOGRAM PERCUTANEOUS BILIARY DRAINAGE CATHETER PLACEMENT via CHOLECYSTOSTOMY ACCESS by IR on 3/9, she went for EUS 05/14/2021. -Continue with IV Ciprofloxacin  and Flagyl .  As she  had a retroperitoneal perforation during ERCP on 3/9.  She will need total of 10 days of antibiotic treatment. -Tolerating regular diet. -Follow on final pathology result -CA 19-9 within normal limit -Total bilirubin trending down, continue to monitor LFTs closely. -CT chest with no evidence of lung metastasis but significant for pleural effusion.    AKI (acute kidney injury)  ([place]) -Continue with IV fluids   Essential hypertension On admission blood pressures elevated up to 166/95.  Home blood pressure regimen includes olmesartan 40 mg daily and Cardizem 180 mg nightly. -Continue Cardizem and pharmacy substitution for olmesartan   Normocytic anemia -Stable, continue to monitor    OSA on CPAP Patient reports using CPAP at night as prescribed. - Continue CPAP per RT at night   Hyperglycemia With A1c of 6.5, continue to monitor CBG closely with insulin sliding scale.     Elevated liver enzymes Secondary to above.  Total bili trending down.   Hyperbilirubinemia Secondary to above.   Abnormal urinalysis On admission patient was noted to have urinalysis with moderate hemoglobin, trace leukocytes, rare bacteria, and 6-10 WBCs. -Check urine culture   Hypokalemia Repleted       DVT prophylaxis: SCD , lovenox Code Status: Full Family Communication: None at bedside Disposition:   Status is: Inpatient    Consultants:  IR GI    Subjective:  Still complaining of abdominal pain, but she denies nausea, vomiting, tolerating oral intake  Objective: Vitals:   05/15/21 0000 05/15/21 0303 05/15/21 0800 05/15/21 1200  BP: (!) 115/93 134/66 136/62 133/64  Pulse: 73 66 71 66  Resp: '20 19 20 '$ (!) 22  Temp: 99 F (37.2 C)  98.2 F (36.8 C) 98.7 F (37.1 C)  TempSrc: Oral   Oral  SpO2: 93% 93% 94% 92%  Weight:      Height:        Intake/Output Summary (Last 24 hours) at 05/15/2021 1407 Last data filed at 05/15/2021 0941 Gross per 24 hour  Intake 1124.08 ml  Output 940  ml  Net 184.08 ml   Filed Weights   05/12/21 1700 05/13/21 0720  Weight: 67.2 kg 67.2 kg    Examination:  Awake Alert, Oriented X 3, No new F.N deficits, Normal affect Symmetrical Chest wall movement, Good air movement bilaterally, CTAB RRR,No Gallops,Rubs or new Murmurs, No Parasternal Heave +ve B.Sounds, Abd Soft, abdomen diffusely tender to palpation, but there is  more pronounced in the right upper quadrant, No rebound - guarding or rigidity. No Cyanosis, Clubbing or edema, No new Rash or bruise       Data Reviewed: I have personally reviewed following labs and imaging studies  CBC: Recent Labs  Lab 05/11/21 2030 05/13/21 0241 05/14/21 0159 05/15/21 0045  WBC 6.6 6.3 13.3* 13.0*  NEUTROABS 4.2  --   --   --   HGB 11.5* 10.3* 11.2* 10.1*  HCT 34.7* 29.5* 32.8* 29.4*  MCV 82.0 80.4 81.4 82.4  PLT 307 254 267 144    Basic Metabolic Panel: Recent Labs  Lab 05/11/21 2030 05/13/21 0241 05/14/21 0159 05/15/21 0045  NA 134* 137 134* 135  K 3.3* 3.6 4.4 3.6  CL 98 106 101 103  CO2 26 21* 24 24  GLUCOSE 181* 147* 219* 189*  BUN '19 16 16 19  '$ CREATININE 1.21* 1.14* 1.07* 1.12*  CALCIUM 9.4 8.7* 8.3* 8.2*    GFR: Estimated Creatinine Clearance: 37.1 mL/min (A) (by C-G formula based on SCr of 1.12 mg/dL (H)).  Liver Function Tests: Recent Labs  Lab 05/11/21 2030 05/13/21 0241 05/14/21 0159 05/15/21 0045  AST 686* 392* 420* 162*  ALT 1,242* 856* 915* 593*  ALKPHOS 402* 336* 340* 241*  BILITOT 8.7* 8.2* 5.1* 3.3*  PROT 7.1 5.7* 5.6* 5.3*  ALBUMIN 3.6 2.9* 2.7* 2.4*    CBG: Recent Labs  Lab 05/14/21 0725 05/14/21 1218 05/14/21 1613 05/15/21 0802 05/15/21 1203  GLUCAP 188* 187* 170* 119* 158*     Recent Results (from the past 240 hour(s))  Resp Panel by RT-PCR (Flu A&B, Covid) Nasopharyngeal Swab     Status: None   Collection Time: 05/12/21  4:37 AM   Specimen: Nasopharyngeal Swab; Nasopharyngeal(NP) swabs in vial transport medium  Result Value Ref Range Status   SARS Coronavirus 2 by RT PCR NEGATIVE NEGATIVE Final    Comment: (NOTE) SARS-CoV-2 target nucleic acids are NOT DETECTED.  The SARS-CoV-2 RNA is generally detectable in upper respiratory specimens during the acute phase of infection. The lowest concentration of SARS-CoV-2 viral copies this assay can detect is 138 copies/mL. A negative result does not  preclude SARS-Cov-2 infection and should not be used as the sole basis for treatment or other patient management decisions. A negative result may occur with  improper specimen collection/handling, submission of specimen other than nasopharyngeal swab, presence of viral mutation(s) within the areas targeted by this assay, and inadequate number of viral copies(<138 copies/mL). A negative result must be combined with clinical observations, patient history, and epidemiological information. The expected result is Negative.  Fact Sheet for Patients:  EntrepreneurPulse.com.au  Fact Sheet for Healthcare Providers:  IncredibleEmployment.be  This test is no t yet approved or cleared by the [place] FDA and  has been authorized for detection and/or diagnosis of SARS-CoV-2 by FDA under an Emergency Use Authorization (EUA). This EUA will remain  in effect (meaning this test can be used) for the duration of the COVID-19 declaration under Section 564(b)(1) of the Act, 21 U.S.C.section 360bbb-3(b)(1), unless the authorization is terminated  or revoked sooner.  Influenza A by PCR NEGATIVE NEGATIVE Final   Influenza B by PCR NEGATIVE NEGATIVE Final    Comment: (NOTE) The Xpert Xpress SARS-CoV-2/FLU/RSV plus assay is intended as an aid in the diagnosis of influenza from Nasopharyngeal swab specimens and should not be used as a sole basis for treatment. Nasal washings and aspirates are unacceptable for Xpert Xpress SARS-CoV-2/FLU/RSV testing.  Fact Sheet for Patients: EntrepreneurPulse.com.au  Fact Sheet for Healthcare Providers: IncredibleEmployment.be  This test is not yet approved or cleared by the [place] FDA and has been authorized for detection and/or diagnosis of SARS-CoV-2 by FDA under an Emergency Use Authorization (EUA). This EUA will remain in effect (meaning this test can be used) for the  duration of the COVID-19 declaration under Section 564(b)(1) of the Act, 21 U.S.C. section 360bbb-3(b)(1), unless the authorization is terminated or revoked.  Performed at [facility], [facility] [address], [place], [place] [address]   Urine Culture     Status: Abnormal   Collection Time: 05/12/21 11:18 AM   Specimen: Urine, Random  Result Value Ref Range Status   Specimen Description URINE, RANDOM  Final   Special Requests   Final    Immunocompromised Performed at [facility], [place] [address], [place], [place] [address]    Culture MULTIPLE SPECIES PRESENT, SUGGEST RECOLLECTION (A)  Final   Report Status 05/13/2021 FINAL  Final  Aerobic/Anaerobic Culture w Gram Stain (surgical/deep wound)     Status: None (Preliminary result)   Collection Time: 05/13/21  5:27 PM   Specimen: Gallbladder; Bile  Result Value Ref Range Status   Specimen Description GALL BLADDER  Final   Special Requests NONE  Final   Gram Stain   Final    NO SQUAMOUS EPITHELIAL CELLS SEEN NO WBC SEEN NO ORGANISMS SEEN    Culture   Final    NO GROWTH 2 DAYS Performed at [facility], [place] [address] [place], [place] [address]    Report Status PENDING  Incomplete         Radiology Studies: CT CHEST WO CONTRAST  Result Date: 05/14/2021 CLINICAL DATA:  Newly diagnosed pancreatic carcinoma.  Staging. * onc * EXAM: CT CHEST WITHOUT CONTRAST TECHNIQUE: Multidetector CT imaging of the chest was performed following the standard protocol without IV contrast. RADIATION DOSE REDUCTION: This exam was performed according to the departmental dose-optimization program which includes automated exposure control, adjustment of the mA and/or kV according to patient size and/or use of iterative reconstruction technique. COMPARISON:  None. FINDINGS: Cardiovascular: No acute findings. Aortic atherosclerotic calcification noted. Mediastinum/Nodes: No masses or pathologically enlarged lymph nodes identified on this  unenhanced exam. Lungs/Pleura: Small right pleural effusion is seen with compressive atelectasis in right lower lobe. No suspicious pulmonary nodules or masses are identified. No evidence of central endobronchial obstruction. Right hilar lymph node calcification and right lower lobe calcified granuloma, consistent with old granulomatous disease. Mild pleural-parenchymal scarring in lateral left lower lobe. Upper Abdomen:  Internal external biliary drain noted. Musculoskeletal:  No suspicious bone lesions. IMPRESSION: No evidence of metastatic disease within the thorax. Small right pleural effusion and right lower lobe compressive atelectasis. Aortic Atherosclerosis (ICD10-I70.0). Electronically Signed   By: Marlaine Hind M.D.   On: 05/14/2021 20:27   IR INT EXT BILIARY DRAIN WITH CHOLANGIOGRAM  Result Date: 05/14/2021 INDICATION: Pancreatic mass.  Malignant biliary obstruction. EXAM: Procedures: 1. ANTEROGRADE CHOLANGIOGRAM 2. PERCUTANEOUS TRANSHEPATIC BILIARY DRAINAGE CATHETER PLACEMENT VIA CHOLECYSTOSTOMY ACCESS Performing Physician: Michaelle Birks, MD Assistant(s): Tomasita Crumble  Annamaria Boots, MD A qualified trainee/resident or advanced practice provider (APP) was not immediately available to assist with this case. COMPARISON:  ERCP, earlier same day. MRI abdomen and CT AP, 05/12/2021. US Abdomen, 05/11/2021. MEDICATIONS: The patient was on scheduled IV antibiotics, including ciprofloxacin and Flagyl. CONTRAST:  64m OMNIPAQUE IOHEXOL 300 MG/ML SOLN - administered into the biliary tree. ANESTHESIA/SEDATION: Moderate (conscious) sedation was employed during this procedure. A total of Versed 4 mg and Fentanyl 200 mcg was administered intravenously. Moderate Sedation Time: 91 minutes. The patient's level of consciousness and vital signs were monitored continuously by radiology nursing throughout the procedure under my direct supervision. FLUOROSCOPY TIME:  32 minutes, 6 seconds. Fluoroscopic dose; 177 mGy. COMPLICATIONS: None  immediate. TECHNIQUE: Informed written consent was obtained from the patient after a discussion of the risks, benefits and alternatives to treatment. Questions regarding the procedure were encouraged and answered. A timeout was performed prior to the initiation of the procedure. The right upper abdominal quadrant was prepped and draped in the usual sterile fashion, and a sterile drape was applied covering the operative field. Maximum barrier sterile technique with sterile gowns and gloves were used for the procedure. A timeout was performed prior to the initiation of the procedure. Ultrasound scanning of the right upper quadrant demonstrates a markedly dilated gallbladder. Utilizing a transhepatic approach, a 22 gauge needle was advanced into the gallbladder under direct ultrasound guidance. An ultrasound image was saved for documentation purposes. Appropriate intraluminal puncture was confirmed with the efflux of bile and advancement of an 0.018 wire into the gallbladder lumen. The needle was exchanged for an ALenexaset. A small amount of contrast was injected to confirm appropriate intraluminal positioning. Over a Benson wire, a 5 Fr, 35 cm Brite tip sheath was advanced into the gallbladder and positioned at the gallbladder neck/cystic duct. Using a 0.018 inch V18 Glidewire and 90 cm Navicross catheter access through the cystic duct into the common bile duct, then finally past the obstruction and into the duodenum was obtained. Transpapillary anchoring with an 0.035 inch Amplatz wire was placed. Over the wire, a 10 Fr internal/external biliary catheter was advanced over wire, across the obstruction, with coil ultimately locked within the duodenum. Contrast was injected and a completion radiographs were obtained. The catheter was connected to a drainage bag which yielded the brisk return of bile. The catheter was secured to the skin with an interrupted suture and StatLock device. Dressings were applied. The  patient tolerated the procedure well without immediate postprocedural complication. FINDINGS: Sonographic evaluation of the liver demonstrates marked intrahepatic biliary ductal dilatation as was demonstrated on preceding abdominal CT. Under direct ultrasound guidance, a dilated gallbladder was accessed via transhepatic approach then cannulation across the cystic duct was performed, allowing for placement of a 10 [place]/externalized biliary drainage catheter with end ultimately coiled and locked within the duodenum. Transpapillary access into duodenum after considerable effort. Antegrade cholangiogram demonstrates marked dilatation of the CBD and intrahepatic biliary tree with communication between the right and left biliary trees at the level of the hilum.Distal obstruction resulting in flush occlusion on cholangiogram. IMPRESSION: Successful placement of a 10 Fr percutaneous internalized/externalized biliary drainage catheter via cholecystostomy access, with catheter end coiled within the duodenum. PLAN: 1. Flush tube w 10 mL sterile NS and record drain output qShift. 2. Follow up for routine tube exchange in 2 month(s). JMichaelle Birks MD [facility] Electronically Signed   By: JMichaelle BirksM.D.   On: 05/14/2021 21:06  Scheduled Meds:  cholestyramine  4 g Oral BID   diltiazem  180 mg Oral QHS   enoxaparin (LOVENOX) injection  40 mg Subcutaneous Q24H   feeding supplement (KATE FARMS STANDARD 1.4)  325 mL Oral BID BM   insulin aspart  0-9 Units Subcutaneous TID WC   irbesartan  300 mg Oral Daily   melatonin  3 mg Oral QHS   multivitamin with minerals  1 tablet Oral Daily   sodium chloride flush  10 mL Intracatheter Q12H   sodium chloride flush  10 mL Intracatheter Q12H   sodium chloride flush  3 mL Intravenous Q12H   Continuous Infusions:  sodium chloride 75 mL/hr at 05/15/21 0800   ciprofloxacin 400 mg (05/15/21 0941)    metronidazole Stopped (05/15/21 0718)     LOS: 3 days       Phillips Climes, MD [facility]   To contact the attending provider between 7A-7P or the covering provider during after hours 7P-7A, please log into the web site [email or web address] and access using universal [place] password for that web site. If you do not have the password, please call the hospital operator.  05/15/2021, 2:07 PM

## 2021-05-15 NOTE — Progress Notes (Signed)
Occupational Therapy Evaluation ?Patient Details ?Name: Tonya Jenkins ?MRN: [id number] ?DOB: Dec 01, 1945 ?Today's Date: 05/15/2021 ? ? ?History of Present Illness 76 y/o female presented to ED on 05/11/21 for jaundice. Found to have significant elevated LFTs, concern for pancreatic malignancy. 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 PMH: HTN, OSA on CPAP  ? ?Clinical Impression ?  ?Pt presents with deficits listed below. PTA pt PLOF living at home alone with PRN assistance from family at DC. Pt reports being I to mod I with ADLs with use of AE and increased time. Pt reports performing cooking light meals and  requiring assistance with cleaning. Pt presents with deficits of weakness, pain, decreased ADL function, and limited functional stability. Pt demonstrated CGA bed mobility, min A seated LB dressing with education of compensatory tech and use of AE (reacher and sock aide), anticipating independent with set up in sitting with grooming and self feeding, assessment of functional standing limited due to c/o nausea and pain. Pt will benefit to continue skilled level OT to address established deficits prior to DC to maximize independence. DC recommendation to home with support vs HHOT. OT will continue to follow acutely.  ?   ? ?Recommendations for follow up therapy are one component of a multi-disciplinary discharge planning process, led by the attending physician.  Recommendations may be updated based on patient status, additional functional criteria and insurance authorization.  ? ?Follow Up Recommendations ? Home health OT  ?  ?Assistance Recommended at Discharge PRN  ?Patient can return home with the following Assistance with cooking/housework ? ?  ?Functional Status Assessment ? Patient has had a recent decline in their functional status and demonstrates the ability to make significant improvements in function in a reasonable and predictable amount of  time.  ?Equipment Recommendations ? BSC/3in1 (recommending sock aide and requesting rollator.)  ?  ?Recommendations for Other Services   ? ? ?  ?Precautions / Restrictions Precautions ?Precautions: Fall ?Precaution Comments: drain on R side ?Restrictions ?Weight Bearing Restrictions: No  ? ?  ? ?Mobility Bed Mobility ?Overal bed mobility: Needs Assistance ?Bed Mobility: Rolling, Sidelying to Sit, Sit to Sidelying ?Rolling: Supervision ?Sidelying to sit: Min guard ?  ?  ?Sit to sidelying: Min guard ?General bed mobility comments: Pt demonstrated learned Log roll technique from prior PT session. min cues for pushing up with B arms to elevate trunk. ?  ? ?Transfers ?Overall transfer level: Needs assistance ?  ?  ?  ?  ?  ?  ?  ?  ?General transfer comment: NT this session due to c/o nausea. Defer to PT assessment. ?  ? ?  ?Balance Overall balance assessment: Mild deficits observed, not formally tested ?  ?   ? ?ADL either performed or assessed with clinical judgement  ? ?ADL Overall ADL's : Needs assistance/impaired ?  ?Eating/Feeding Details (indicate cue type and reason): NT but will infer independent with set up in sitting. ?  ?Grooming Details (indicate cue type and reason): NT but will infer set up in sitting. ?  ?  ?  ?  ?Upper Body Dressing : Supervision/safety;Set up ?Upper Body Dressing Details (indicate cue type and reason): Pt limited with UE movement due to pain. Will anticipate assistance to manage clothing. ?Lower Body Dressing: Minimal assistance ?Lower Body Dressing Details (indicate cue type and reason): Pt educated of figure 4 position to limit reaching forward to safely doff R sock, pt exhibits pain and required  min A to thread foot into sock. Decreased assistance for LLE. Pt educated of use of reacher and sock aide in home setting to limited reaching down or forward. ?  ?Armed forces technical officer Details (indicate cue type and reason): NT due to c/o nausea and requesting to return to supine. Defer to PT  assessment. ?  ?  ?  ?  ?  ?General ADL Comments: Session limited with standing balance due to c/o nausea reported to RN. Pt presented back to supine.  ? ? ? ?Vision Baseline Vision/History: 1 Wears glasses (occasitonally for driving.) ?   ?   ?   ?   ? ?Pertinent Vitals/Pain Pain Assessment ?Pain Assessment: Faces ?Faces Pain Scale: Hurts even more (with movement.) ?Pain Location: R abdomen ?Pain Descriptors / Indicators: Discomfort, Grimacing, Guarding ?Pain Intervention(s): Monitored during session  ? ? ? ?Hand Dominance Right ?  ?Extremity/Trunk Assessment Upper Extremity Assessment ?Upper Extremity Assessment: Generalized weakness ?  ?Lower Extremity Assessment ?Lower Extremity Assessment: Defer to PT evaluation ?  ?Cervical / Trunk Assessment ?Cervical / Trunk Assessment: Normal ?  ?Communication Communication ?Communication: No difficulties ?  ?Cognition Arousal/Alertness: Awake/alert ?Behavior During Therapy: [facility] for tasks assessed/performed ?Overall Cognitive Status: Within Functional Limits for tasks assessed ?  ?  ?  ?  ?  ?  ?  ?  ?  ?  ?  ?  ?  ?  ?  ?  ?  ?  ?  ?General Comments  Pt on RA 93% SpO2, HR 66 bpm, BP levels 136/62 prior to start of session, 123/72 sitting, 126/100 supine. ? ?  ?   ?   ? ?Home Living Family/patient expects to be discharged to:: Private residence ?Living Arrangements: Alone ?Available Help at Discharge: Friend(s);Available PRN/intermittently ?Type of Home: House ?Home Access: Stairs to enter ?Entrance Stairs-Number of Steps: 5 ?Entrance Stairs-Rails: Right;Left;Can reach both ?Home Layout: One level ?  ?  ?Bathroom Shower/Tub: Walk-in shower ?  ?Bathroom Toilet: Handicapped height ?  ?  ?Home Equipment: Rollator (4 wheels);Cane - single point;Grab bars - tub/shower;Hand held shower head;Grab bars - toilet ?  ?Additional Comments: cares for her dog. ?  ? ?  ?Prior Functioning/Environment Prior Level of Function : Independent/Modified Independent ?  ?  ?  ?  ?  ?  ?Mobility  Comments: ambulates with rollator and SPC interchangeably dependent on where she goes ?ADLs Comments: light cooking, mentions hiring someone to clean home, still drives. Reports being I to mod I with ADLs with increased time and use of AE. ?  ? ?  ?  ?OT Problem List: Decreased strength;Decreased range of motion;Decreased activity tolerance;Decreased safety awareness;Pain;Decreased knowledge of use of DME or AE ?  ?   ?OT Treatment/Interventions: Self-care/ADL training;Therapeutic exercise;DME and/or AE instruction;Therapeutic activities;Patient/family education  ?  ?OT Goals(Current goals can be found in the care plan section) Acute Rehab OT Goals ?Patient Stated Goal: To return home safely with BSC. ?OT Goal Formulation: With patient ?Time For Goal Achievement: 05/29/21 ?Potential to Achieve Goals: Good  ?OT Frequency: Min 2X/week ?  ? ?Co-evaluation   ?  ?  ?  ?  ? ?  ?AM-PAC OT "6 Clicks" Daily Activity     ?Outcome Measure Help from another person eating meals?: None ?Help from another person taking care of personal grooming?: None ?Help from another person toileting, which includes using toliet, bedpan, or urinal?: A Little ?Help from another person bathing (including washing, rinsing, drying)?: A Little ?Help from another person to put  on and taking off regular upper body clothing?: A Little ?Help from another person to put on and taking off regular lower body clothing?: A Little ?6 Click Score: 20 ?  ?End of Session Nurse Communication: Mobility status (requestind meds.) ? ?Activity Tolerance: Other (comment) (limited with nausea) ?Patient left: in bed;with call bell/phone within reach;with bed alarm set;with nursing/sitter in room ? ?OT Visit Diagnosis: Muscle weakness (generalized) (M62.81);Pain  ?              ?Time: 6950-7225 ?OT Time Calculation (min): 23 min ?Charges:  OT General Charges ?$OT Visit: 1 Visit ?OT Evaluation ?$OT Eval Low Complexity: 1 Low ?OT Treatments ?$Self Care/Home Management : 8-22  mins ? ?Minus Breeding, MSOT, OTR/L ? ?Supplemental Rehabilitation Services ? ?[phone] ? ? ?Marius Ditch ?05/15/2021, 11:37 AM ?

## 2021-05-16 ENCOUNTER — Inpatient Hospital Stay (HOSPITAL_COMMUNITY): Payer: Medicare Other

## 2021-05-16 DIAGNOSIS — K831 Obstruction of bile duct: Secondary | ICD-10-CM | POA: Diagnosis not present

## 2021-05-16 DIAGNOSIS — J90 Pleural effusion, not elsewhere classified: Secondary | ICD-10-CM | POA: Diagnosis not present

## 2021-05-16 DIAGNOSIS — N179 Acute kidney failure, unspecified: Secondary | ICD-10-CM | POA: Diagnosis not present

## 2021-05-16 DIAGNOSIS — K8689 Other specified diseases of pancreas: Secondary | ICD-10-CM | POA: Diagnosis not present

## 2021-05-16 LAB — LACTATE DEHYDROGENASE, PLEURAL OR PERITONEAL FLUID: LD, Fluid: 247 U/L — ABNORMAL HIGH (ref 3–23)

## 2021-05-16 LAB — COMPREHENSIVE METABOLIC PANEL
ALT: 390 U/L — ABNORMAL HIGH (ref 0–44)
AST: 70 U/L — ABNORMAL HIGH (ref 15–41)
Albumin: 2.3 g/dL — ABNORMAL LOW (ref 3.5–5.0)
Alkaline Phosphatase: 209 U/L — ABNORMAL HIGH (ref 38–126)
Anion gap: 9 (ref 5–15)
BUN: 14 mg/dL (ref 8–23)
CO2: 24 mmol/L (ref 22–32)
Calcium: 8.2 mg/dL — ABNORMAL LOW (ref 8.9–10.3)
Chloride: 101 mmol/L (ref 98–111)
Creatinine, Ser: 1.03 mg/dL — ABNORMAL HIGH (ref 0.44–1.00)
GFR, Estimated: 57 mL/min — ABNORMAL LOW (ref >60)
Glucose, Bld: 172 mg/dL — ABNORMAL HIGH (ref 70–99)
Potassium: 3.2 mmol/L — ABNORMAL LOW (ref 3.5–5.1)
Sodium: 134 mmol/L — ABNORMAL LOW (ref 135–145)
Total Bilirubin: 3 mg/dL — ABNORMAL HIGH (ref 0.3–1.2)
Total Protein: 5.5 g/dL — ABNORMAL LOW (ref 6.5–8.1)

## 2021-05-16 LAB — CBC
HCT: 29.5 % — ABNORMAL LOW (ref 36.0–46.0)
Hemoglobin: 10 g/dL — ABNORMAL LOW (ref 12.0–15.0)
MCH: 28.3 pg (ref 26.0–34.0)
MCHC: 33.9 g/dL (ref 30.0–36.0)
MCV: 83.6 fL (ref 80.0–100.0)
Platelets: 236 10*3/uL (ref 150–400)
RBC: 3.53 MIL/uL — ABNORMAL LOW (ref 3.87–5.11)
RDW: 18.1 % — ABNORMAL HIGH (ref 11.5–15.5)
WBC: 11.3 10*3/uL — ABNORMAL HIGH (ref 4.0–10.5)
nRBC: 0 % (ref 0.0–0.2)

## 2021-05-16 LAB — GLUCOSE, CAPILLARY
Glucose-Capillary: 122 mg/dL — ABNORMAL HIGH (ref 70–99)
Glucose-Capillary: 146 mg/dL — ABNORMAL HIGH (ref 70–99)
Glucose-Capillary: 117 mg/dL — ABNORMAL HIGH (ref 70–99)
Glucose-Capillary: 154 mg/dL — ABNORMAL HIGH (ref 70–99)

## 2021-05-16 LAB — PROTEIN, PLEURAL OR PERITONEAL FLUID: Total protein, fluid: <3 g/dL

## 2021-05-16 IMAGING — US US THORACENTESIS ASP PLEURAL SPACE W/IMG GUIDE
1 series · 7 of 7 positions shown · non-contrast
Comparison: none

INDICATION: Biliary obstruction due to pancreatic mass. Right pleural effusion.
Request for diagnostic and therapeutic thoracentesis.

[Series 1: us thoracentesis asp pleural space w/img guide · 7 of 7 slices shown]
[im 1/7]
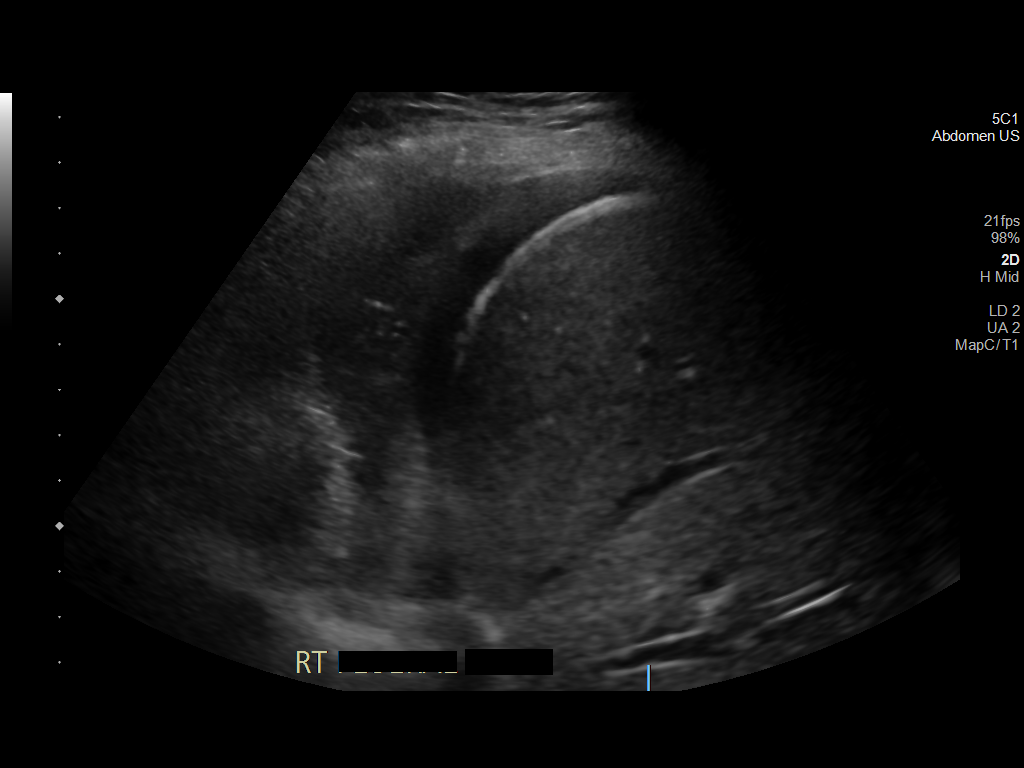
[im 2/7]
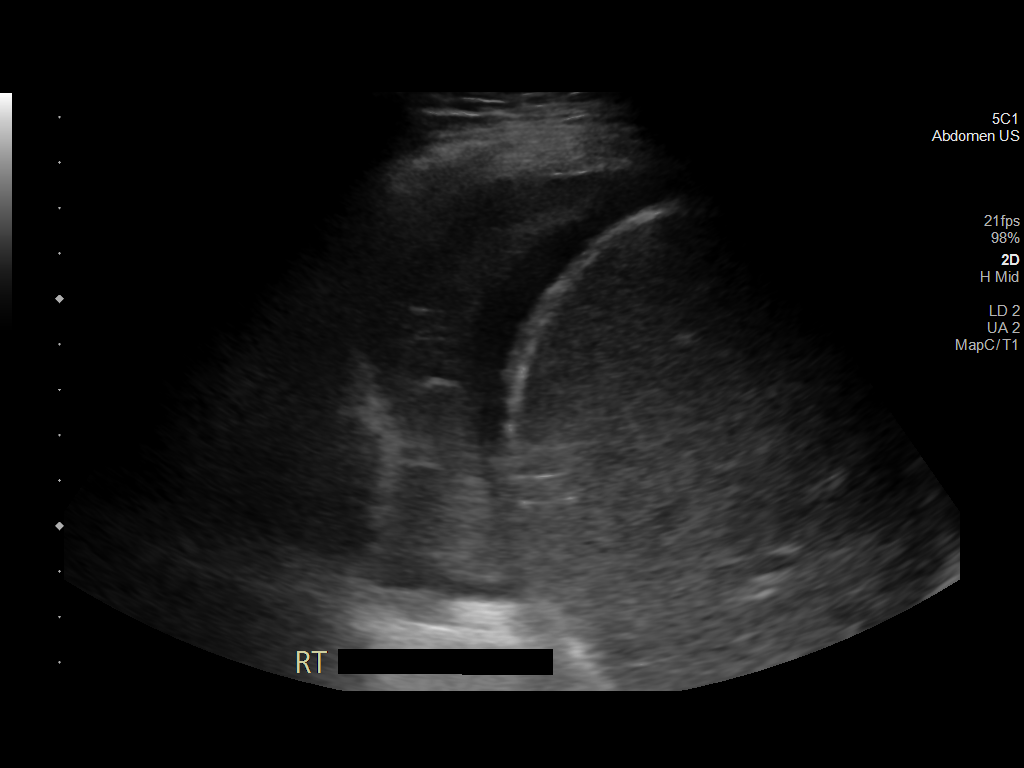
[im 3/7]
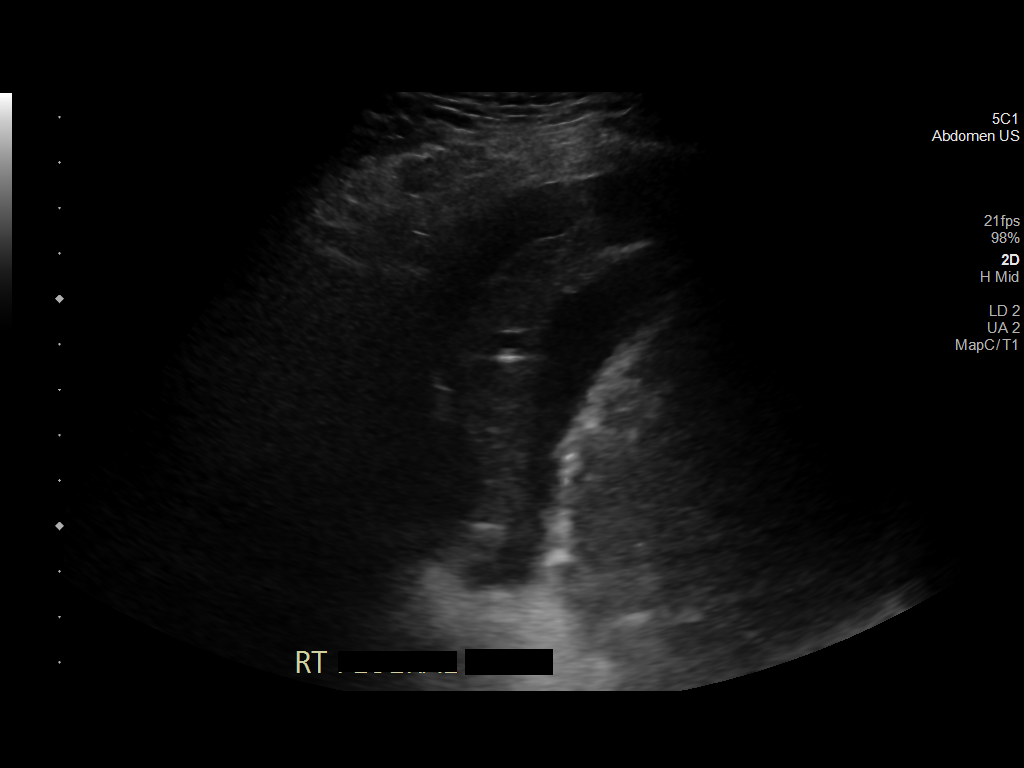
[im 4/7]
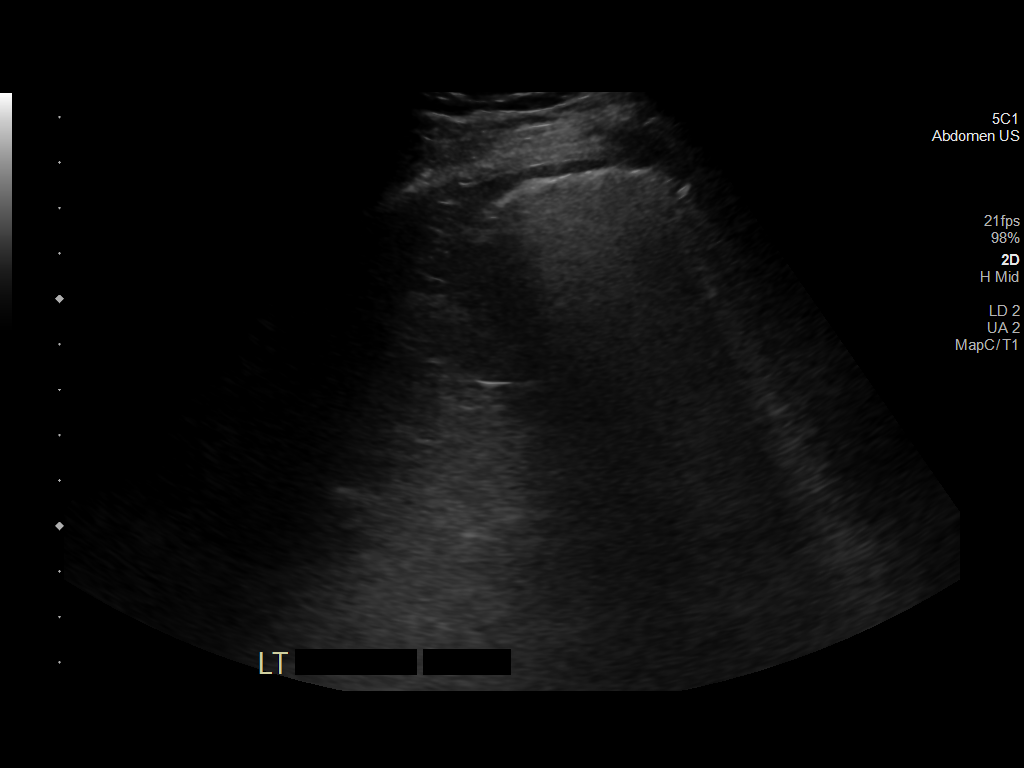
[im 5/7]
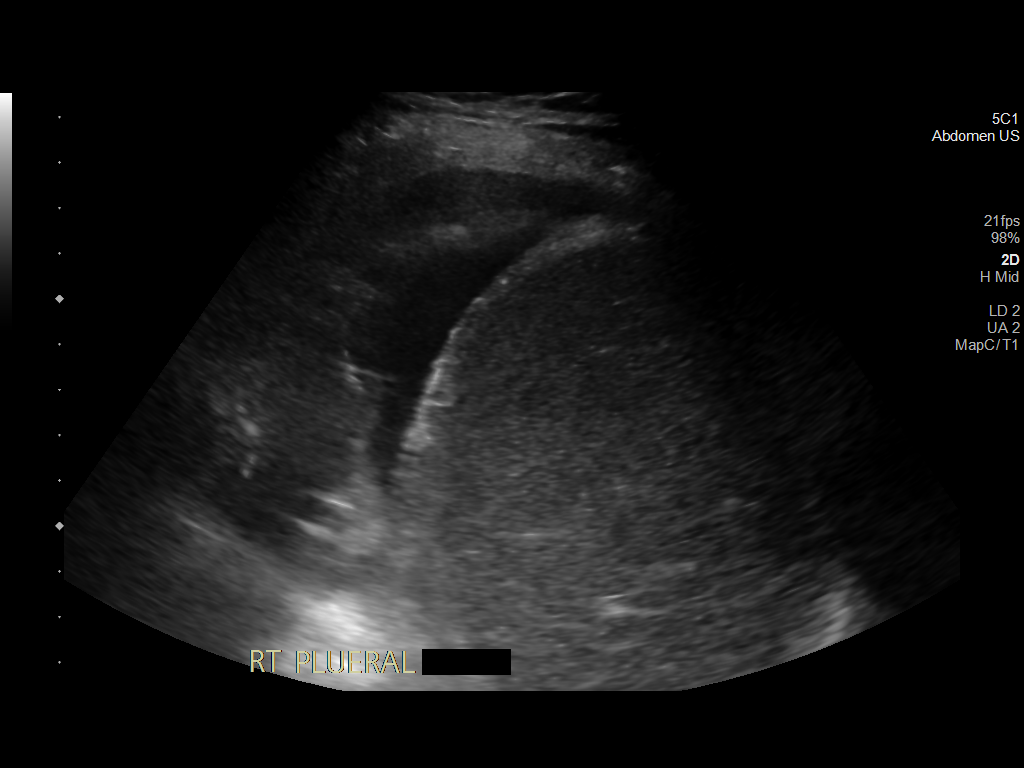
[im 6/7]
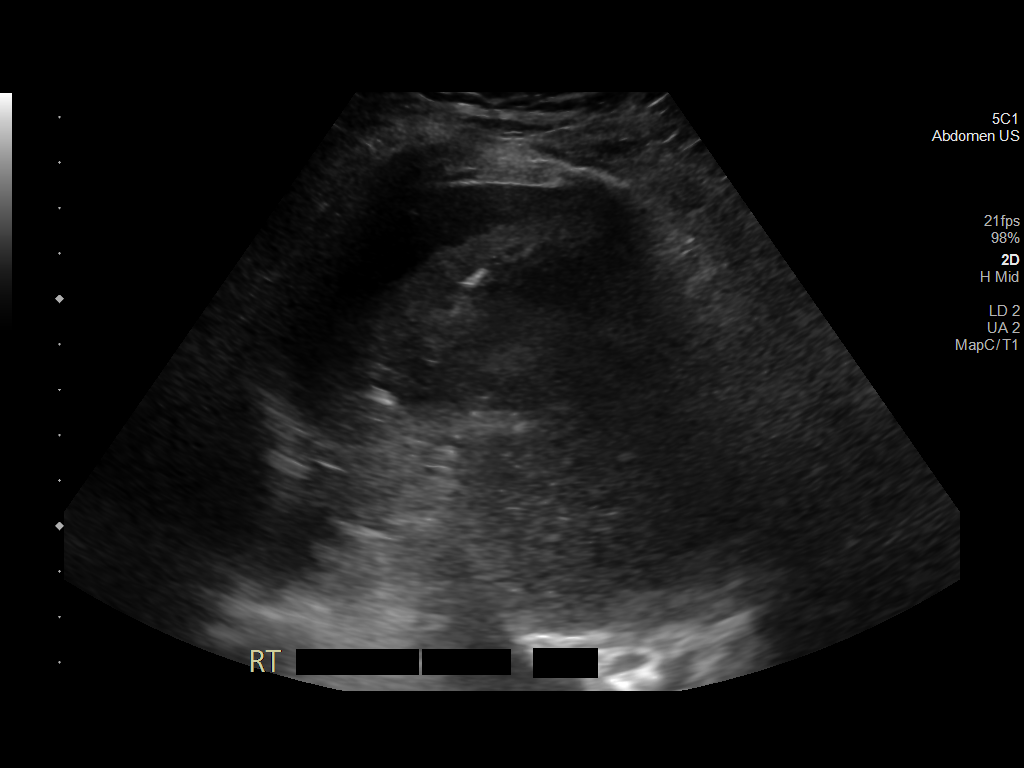
[im 7/7]
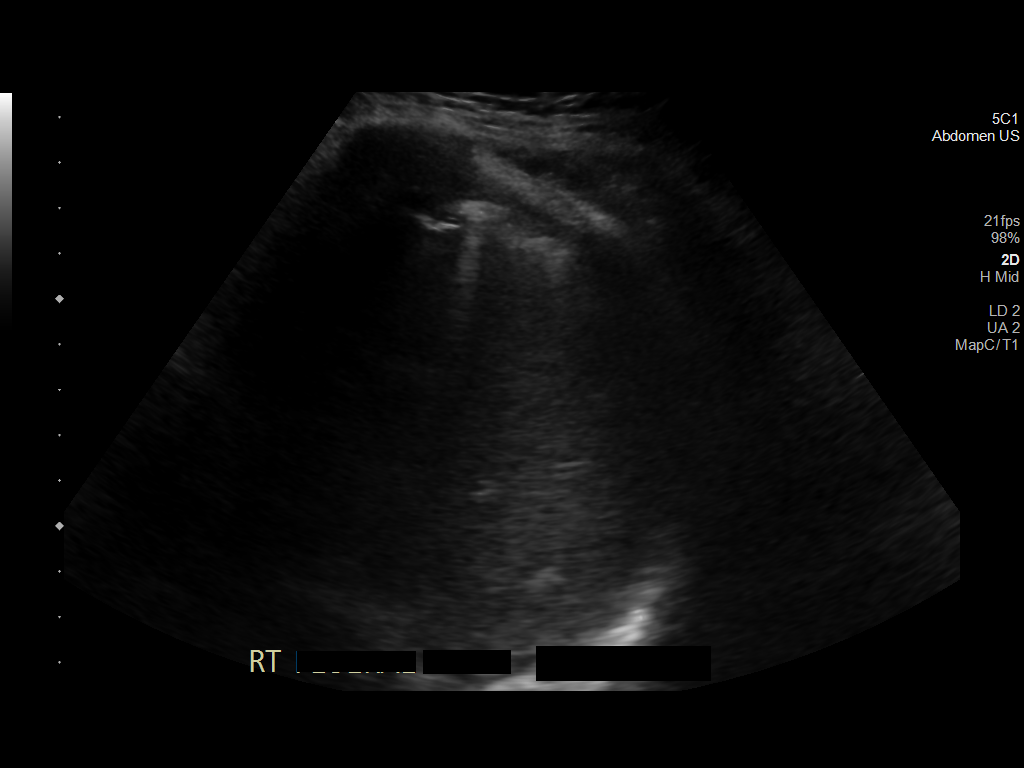

[7 of 7 positions shown; findings below may reference images not displayed]

EXAM:
ULTRASOUND GUIDED RIGHT THORACENTESIS

MEDICATIONS:
1% lidocaine 10 mL

COMPLICATIONS:
None immediate.

PROCEDURE:
An ultrasound guided thoracentesis was thoroughly discussed with the
patient and questions answered. The benefits, risks, alternatives
and complications were also discussed. The patient understands and
wishes to proceed with the procedure. Written consent was obtained.

Ultrasound was performed to localize and mark an adequate pocket of
fluid in the right chest. The area was then prepped and draped in
the normal sterile fashion. 1% Lidocaine was used for local
anesthesia. Under ultrasound guidance a 6 Fr Safe-T-Centesis
catheter was introduced. Thoracentesis was performed. The catheter
was removed and a dressing applied.
FINDINGS: A total of approximately 150 mL of hazy yellow fluid was removed.
Samples were sent to the laboratory as requested by the clinical
team.
IMPRESSION: Successful ultrasound guided right thoracentesis yielding 150 mL of
pleural fluid.

No pneumothorax on post-procedure chest x-ray.

## 2021-05-16 IMAGING — DX DG CHEST 1V
1 series · 1 of 1 positions shown · non-contrast
Comparison: No prior chest x-ray.  Chest CT [DATE].

CLINICAL DATA: 75-year-old female status post thoracentesis.

EXAM:
CHEST  1 VIEW

[chest ap]
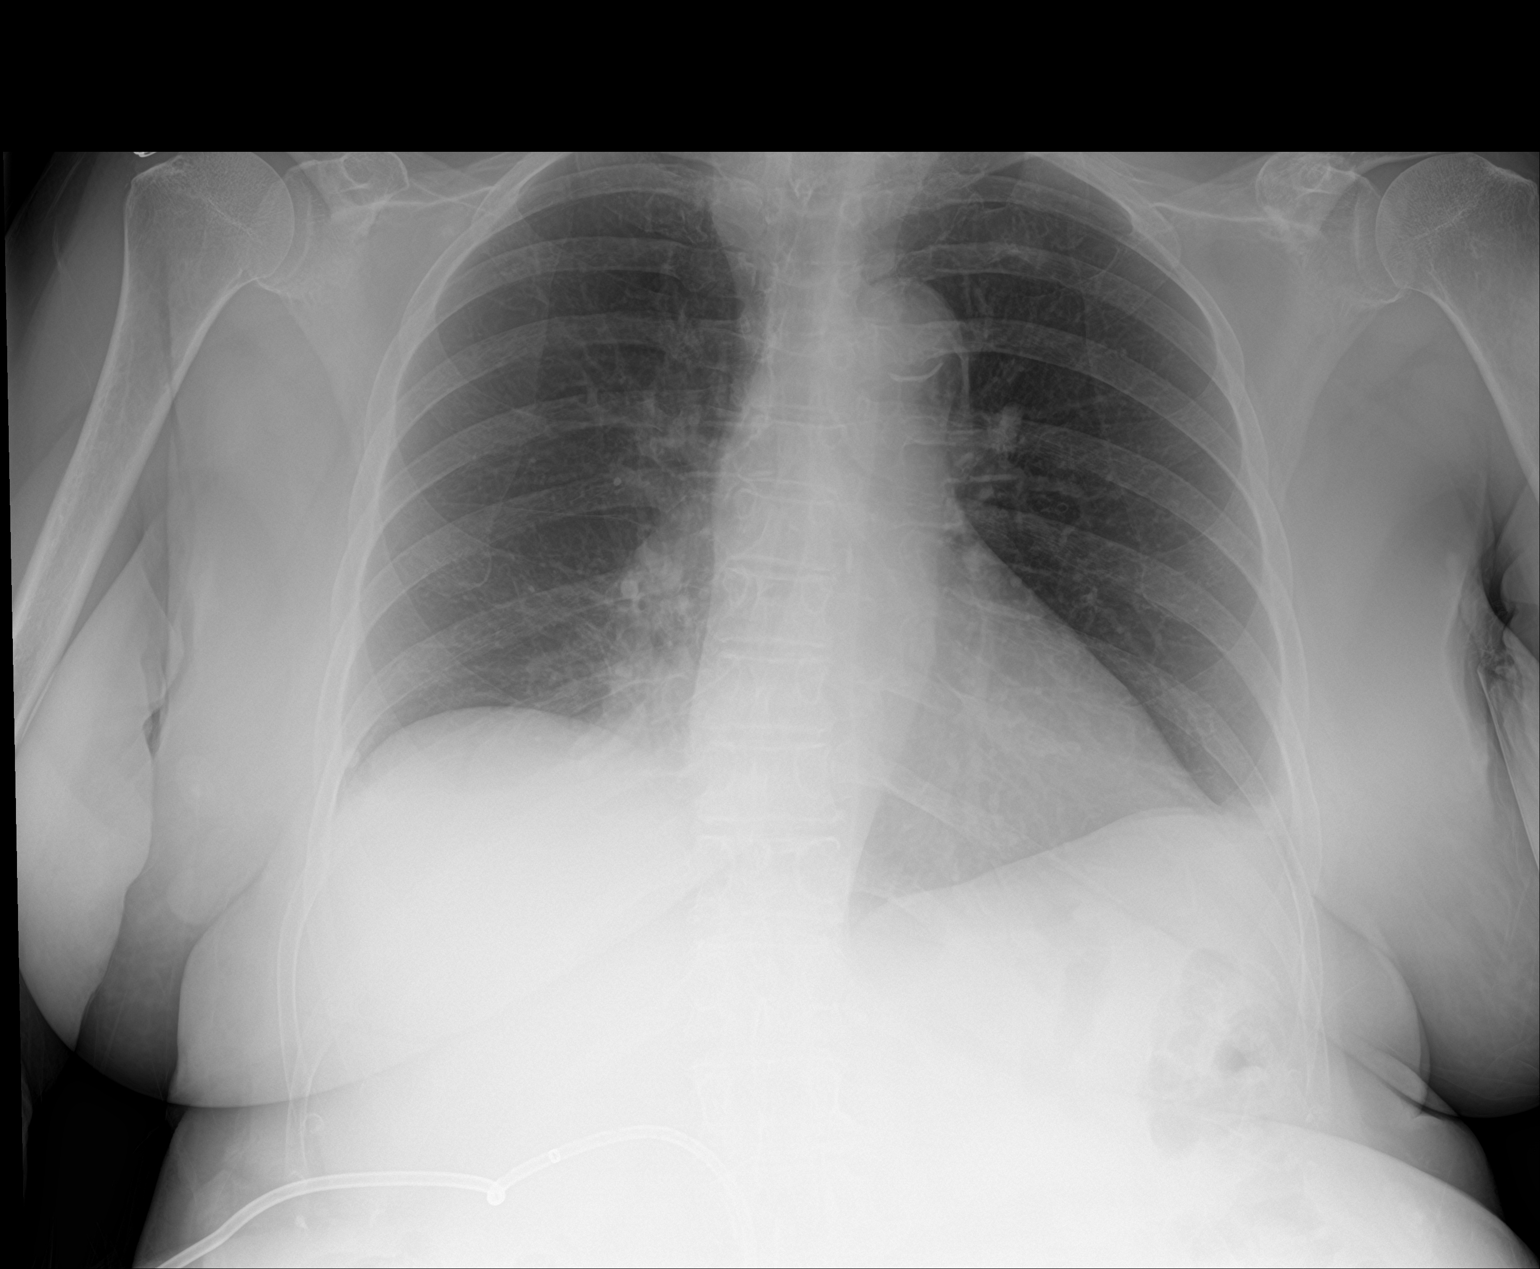

[1 of 1 positions shown; findings below may reference images not displayed]

FINDINGS: Lung volumes are normal. No consolidative airspace disease. No
definite right pleural effusion. Trace left pleural effusion. No
pneumothorax. No pulmonary nodule or mass noted. Pulmonary
vasculature and the cardiomediastinal silhouette are within normal
limits. Atherosclerosis in the thoracic aorta. Catheter projecting
over the right upper quadrant of the abdomen, compatible with
percutaneous biliary drain.
IMPRESSION: 1. No postprocedural pneumothorax.
2. Trace left pleural effusion.
3. Aortic atherosclerosis.

## 2021-05-16 MED ORDER — POLYETHYLENE GLYCOL 3350 17 G PO PACK
17.0000 g | PACK | Freq: Two times a day (BID) | ORAL | Status: DC
Start: 2021-05-16 — End: 2021-05-17
  Administered 2021-05-16 – 2021-05-17 (×2): 17 g via ORAL
  Filled 2021-05-16 (×3): qty 1

## 2021-05-16 MED ORDER — LIDOCAINE HCL (PF) 1 % IJ SOLN
INTRAMUSCULAR | Status: AC
  Filled 2021-05-16: qty 30

## 2021-05-16 MED ORDER — POTASSIUM CHLORIDE CRYS ER 20 MEQ PO TBCR
40.0000 meq | EXTENDED_RELEASE_TABLET | ORAL | Status: AC
  Administered 2021-05-16 (×2): 40 meq via ORAL
  Filled 2021-05-16 (×2): qty 2

## 2021-05-16 MED ORDER — MENTHOL 3 MG MT LOZG
1.0000 | LOZENGE | OROMUCOSAL | Status: DC | PRN
  Filled 2021-05-16: qty 9

## 2021-05-16 MED ORDER — TRAMADOL HCL 50 MG PO TABS: 50.0000 mg | ORAL_TABLET | Freq: Four times a day (QID) | ORAL | Status: DC | PRN

## 2021-05-16 MED ORDER — PHENOL 1.4 % MT LIQD
1.0000 | OROMUCOSAL | Status: DC | PRN
  Filled 2021-05-16: qty 177

## 2021-05-16 NOTE — Progress Notes (Signed)
[facility] Progress Note ?Covering for Drs. Collene Mares and [place] ? ? ? ?Since last GI note: ?She was found to have a small pleural effusion on staging chest CT, underwent IR thoracentesis this morning yielding 160m of hazy yellow fluid sent for the usual tests, including cytology. ? ?She has appropriate discomfort at perc biliary drain site. ? ?Sore throat as well, limiting her po intake a bit. ? ? ?Objective: ?Vital signs in last 24 hours: ?Temp:  [98.3 ?F (36.8 ?C)-100 ?F (37.8 ?C)] 98.6 ?F (37 ?C) (03/12 1158) ?Pulse Rate:  [68-81] 68 (03/12 1158) ?Resp:  [16-18] 16 (03/12 1158) ?BP: (128-148)/(57-73) 148/73 (03/12 1158) ?SpO2:  [93 %-95 %] 95 % (03/12 1158) ?Last BM Date : 05/14/21 ?General: alert and oriented times 3 ?Heart: regular rate and rythm ?Abdomen: soft, non-tender, non-distended, normal bowel sounds ?PercGB-bili drain filled with dark bilious fluid ? ?Lab Results: ?Recent Labs  ?  05/14/21 ?0159 05/15/21 ?0045 05/16/21 ?0011  ?WBC 13.3* 13.0* 11.3*  ?HGB 11.2* 10.1* 10.0*  ?PLT 267 228 236  ?MCV 81.4 82.4 83.6  ? ?Recent Labs  ?  05/14/21 ?0159 05/15/21 ?0045 05/16/21 ?0011  ?NA 134* 135 134*  ?K 4.4 3.6 3.2*  ?CL 101 103 101  ?CO2 '24 24 24  '$ ?GLUCOSE 219* 189* 172*  ?BUN '16 19 14  '$ ?CREATININE 1.07* 1.12* 1.03*  ?CALCIUM 8.3* 8.2* 8.2*  ? ?Recent Labs  ?  05/14/21 ?0159 05/15/21 ?0045 05/16/21 ?0011  ?PROT 5.6* 5.3* 5.5*  ?ALBUMIN 2.7* 2.4* 2.3*  ?AST 420* 162* 70*  ?ALT 915* 593* 390*  ?ALKPHOS 340* 241* 209*  ?BILITOT 5.1* 3.3* 3.0*  ?BILIDIR  --  1.7*  --   ?IBILI  --  1.6*  --   ? ?Medications: ?Scheduled Meds: ? cholestyramine  4 g Oral BID  ? diltiazem  180 mg Oral QHS  ? enoxaparin (LOVENOX) injection  40 mg Subcutaneous Q24H  ? feeding supplement (KATE FARMS STANDARD 1.4)  325 mL Oral BID BM  ? insulin aspart  0-9 Units Subcutaneous TID WC  ? irbesartan  300 mg Oral Daily  ? melatonin  3 mg Oral QHS  ? multivitamin with minerals  1 tablet Oral Daily  ? polyethylene glycol  17 g Oral BID   ? potassium chloride  40 mEq Oral Q4H  ? sodium chloride flush  10 mL Intracatheter Q12H  ? sodium chloride flush  10 mL Intracatheter Q12H  ? sodium chloride flush  3 mL Intravenous Q12H  ? ?Continuous Infusions: ? ciprofloxacin 400 mg (05/16/21 1006)  ? metronidazole 500 mg (05/16/21 0530)  ? ?PRN Meds:.albuterol, fentaNYL (SUBLIMAZE) injection, hydrOXYzine, labetalol, naphazoline-glycerin, ondansetron **OR** ondansetron (ZOFRAN) IV, traMADol ? ? ? ?Assessment/Plan: ?76y.o. female with pancreatic mass causing biliary obstruction, also found to have a small right sided pleural effusion on staging tests. ? ?The mass does not appear to involve any major vessels on my review of her imaging results ? ?ERCP 3/9 was unsuccessful and likely resulted in small retroperitoneal perf (with wire and sphincterotome). Plan is for her to remain on abx for total of 10 days.  Perc bili drain is functioning, TBili was 8.7 prior to the drain (internal, external drain via Gallbladder) and is 3 today. ? ?EUS 3/10 FNA of panc mass, cytology results pending. Should be back Monday or Tuesday. ? ?Thoracentesis today, 1564mremoved. If cytology is + then will define metastatic disease. ? ?She is safe for d/c from GI perspective.  Will need IR drain care  appointment in the next few weeks.   ? ?Milus Banister, MD  05/16/2021, 12:32 PM ?[facility] ?Pager [phone]) [phone] ? ? ? ?

## 2021-05-16 NOTE — Progress Notes (Signed)
PROGRESS NOTE    Tonya Jenkins  YKZ:[id number] DOB: 1946-02-11 DOA: 05/11/2021 PCP: [name], MD    Chief Complaint  Patient presents with   Jaundice    Brief Narrative:   Tonya Jenkins is a 76 y.o. 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  Significant for painless obstructive jaundice, and pancreatic mass, initial attempt for ERCP were unsuccessful for stent placement, had ANTEROGRADE CHOLANGIOGRAM PERCUTANEOUS BILIARY DRAINAGE CATHETER PLACEMENT via CHOLECYSTOSTOMY ACCESS by IR on 3/9, she went for EUS 05/14/2021. -CT chest without contrast for staging obtained, with no evidence of lung mets, but with small right pleural effusion status post diagnostic thoracentesis 3/12.    Assessment & Plan:   Principal Problem:   Obstructive jaundice secondary to pancreatic mass Active Problems:   Pancreatic mass   Possible AKI (acute kidney injury) ([place])   Normocytic anemia   Essential hypertension   Hypokalemia   Abnormal urinalysis   Hyperbilirubinemia   Elevated liver enzymes   Hyperglycemia   OSA on CPAP  Obstructive jaundice secondary to pancreatic mass - Patient presented due to concerns of jaundice with severe itching.  Significant elevated LFTs, concern for pancreatic malignancy . -GI input greatly appreciated, she went for ERCP 3/9 , with unsuccessful stent placement. - ANTEROGRADE CHOLANGIOGRAM PERCUTANEOUS BILIARY DRAINAGE CATHETER PLACEMENT via CHOLECYSTOSTOMY ACCESS by IR on 3/9, she went for EUS 05/14/2021. -Continue with IV Ciprofloxacin  and Flagyl .  As she  had a retroperitoneal perforation during ERCP on 3/9.  She will need total of 10 days of antibiotic treatment. -Tolerating regular diet. -Follow on final pathology result -CA 19-9 within normal limit -Total bilirubin  trending down, continue to monitor LFTs closely. -CT chest with no evidence of lung metastasis but significant for mild pleural effusion.  Pleural effusion -Mild, status post thoracentesis, cytology was sent and pending, peers to be exudative given LDH of fluid of 247    AKI (acute kidney injury) ([place]) -Continue with IV fluids   Essential hypertension On admission blood pressures elevated up to 166/95.  Home blood pressure regimen includes olmesartan 40 mg daily and Cardizem 180 mg nightly. -Continue Cardizem and pharmacy substitution for olmesartan   Normocytic anemia -Stable, continue to monitor    OSA on CPAP Patient reports using CPAP at night as prescribed. - Continue CPAP per RT at night   Hyperglycemia With A1c of 6.5, continue to monitor CBG closely with insulin sliding scale.     Elevated liver enzymes Secondary to above.  Total bili trending down.   Hyperbilirubinemia Secondary to above.   Abnormal urinalysis On admission patient was noted to have urinalysis with moderate hemoglobin, trace leukocytes, rare bacteria, and 6-10 WBCs. -Check urine culture   Hypokalemia Repleted       DVT prophylaxis: SCD , lovenox Code Status: Full Family Communication: None at bedside Disposition:   Status is: Inpatient    Consultants:  IR GI    Subjective:  Complaining of abdominal pain, no nausea, no vomiting but she has poor appetite, no bowel movement yet since admission.  Objective: Vitals:   05/16/21 0905 05/16/21 0910 05/16/21 0915 05/16/21 1158  BP: 134/67 (!) 128/57 140/71 (!) 148/73  Pulse:    68  Resp:    16  Temp:    98.6 F (37 C)  TempSrc:  Oral  SpO2:    95%  Weight:      Height:        Intake/Output Summary (Last 24 hours) at 05/16/2021 1409 Last data filed at 05/16/2021 1400 Gross per 24 hour  Intake 300 ml  Output 875 ml  Net -575 ml   Filed Weights   05/12/21 1700 05/13/21 0720  Weight: 67.2 kg 67.2 kg     Examination:  Awake Alert, Oriented X 3, No new F.N deficits, jaundiced almost resolved Symmetrical Chest wall movement, Good air movement bilaterally, CTAB RRR,No Gallops,Rubs or new Murmurs, No Parasternal Heave +ve B.Sounds, Abd Soft, right upper quadrant tenderness, No rebound - guarding or rigidity. No Cyanosis, Clubbing or edema, No new Rash or bruise       Data Reviewed: I have personally reviewed following labs and imaging studies  CBC: Recent Labs  Lab 05/11/21 2030 05/13/21 0241 05/14/21 0159 05/15/21 0045 05/16/21 0011  WBC 6.6 6.3 13.3* 13.0* 11.3*  NEUTROABS 4.2  --   --   --   --   HGB 11.5* 10.3* 11.2* 10.1* 10.0*  HCT 34.7* 29.5* 32.8* 29.4* 29.5*  MCV 82.0 80.4 81.4 82.4 83.6  PLT 307 254 267 228 762    Basic Metabolic Panel: Recent Labs  Lab 05/11/21 2030 05/13/21 0241 05/14/21 0159 05/15/21 0045 05/16/21 0011  NA 134* 137 134* 135 134*  K 3.3* 3.6 4.4 3.6 3.2*  CL 98 106 101 103 101  CO2 26 21* '24 24 24  '$ GLUCOSE 181* 147* 219* 189* 172*  BUN '19 16 16 19 14  '$ CREATININE 1.21* 1.14* 1.07* 1.12* 1.03*  CALCIUM 9.4 8.7* 8.3* 8.2* 8.2*    GFR: Estimated Creatinine Clearance: 40.4 mL/min (A) (by C-G formula based on SCr of 1.03 mg/dL (H)).  Liver Function Tests: Recent Labs  Lab 05/11/21 2030 05/13/21 0241 05/14/21 0159 05/15/21 0045 05/16/21 0011  AST 686* 392* 420* 162* 70*  ALT 1,242* 856* 915* 593* 390*  ALKPHOS 402* 336* 340* 241* 209*  BILITOT 8.7* 8.2* 5.1* 3.3* 3.0*  PROT 7.1 5.7* 5.6* 5.3* 5.5*  ALBUMIN 3.6 2.9* 2.7* 2.4* 2.3*    CBG: Recent Labs  Lab 05/15/21 1203 05/15/21 1647 05/15/21 2050 05/16/21 0817 05/16/21 1200  GLUCAP 158* 111* 118* 122* 146*     Recent Results (from the past 240 hour(s))  Resp Panel by RT-PCR (Flu A&B, Covid) Nasopharyngeal Swab     Status: None   Collection Time: 05/12/21  4:37 AM   Specimen: Nasopharyngeal Swab; Nasopharyngeal(NP) swabs in vial transport medium  Result Value Ref  Range Status   SARS Coronavirus 2 by RT PCR NEGATIVE NEGATIVE Final    Comment: (NOTE) SARS-CoV-2 target nucleic acids are NOT DETECTED.  The SARS-CoV-2 RNA is generally detectable in upper respiratory specimens during the acute phase of infection. The lowest concentration of SARS-CoV-2 viral copies this assay can detect is 138 copies/mL. A negative result does not preclude SARS-Cov-2 infection and should not be used as the sole basis for treatment or other patient management decisions. A negative result may occur with  improper specimen collection/handling, submission of specimen other than nasopharyngeal swab, presence of viral mutation(s) within the areas targeted by this assay, and inadequate number of viral copies(<138 copies/mL). A negative result must be combined with clinical observations, patient history, and epidemiological information. The expected result is Negative.  Fact Sheet for Patients:  EntrepreneurPulse.com.au  Fact Sheet for Healthcare Providers:  IncredibleEmployment.be  This test is no t yet approved or  cleared by the [place] and  has been authorized for detection and/or diagnosis of SARS-CoV-2 by FDA under an Emergency Use Authorization (EUA). This EUA will remain  in effect (meaning this test can be used) for the duration of the COVID-19 declaration under Section 564(b)(1) of the Act, 21 U.S.C.section 360bbb-3(b)(1), unless the authorization is terminated  or revoked sooner.       Influenza A by PCR NEGATIVE NEGATIVE Final   Influenza B by PCR NEGATIVE NEGATIVE Final    Comment: (NOTE) The Xpert Xpress SARS-CoV-2/FLU/RSV plus assay is intended as an aid in the diagnosis of influenza from Nasopharyngeal swab specimens and should not be used as a sole basis for treatment. Nasal washings and aspirates are unacceptable for Xpert Xpress SARS-CoV-2/FLU/RSV testing.  Fact Sheet for  Patients: EntrepreneurPulse.com.au  Fact Sheet for Healthcare Providers: IncredibleEmployment.be  This test is not yet approved or cleared by the [place] FDA and has been authorized for detection and/or diagnosis of SARS-CoV-2 by FDA under an Emergency Use Authorization (EUA). This EUA will remain in effect (meaning this test can be used) for the duration of the COVID-19 declaration under Section 564(b)(1) of the Act, 21 U.S.C. section 360bbb-3(b)(1), unless the authorization is terminated or revoked.  Performed at [facility], [place] [address], [place], [place] [address]   Urine Culture     Status: Abnormal   Collection Time: 05/12/21 11:18 AM   Specimen: Urine, Random  Result Value Ref Range Status   Specimen Description URINE, RANDOM  Final   Special Requests   Final    Immunocompromised Performed at [facility], [place] [address] [place], [place] [address]    Culture MULTIPLE SPECIES PRESENT, SUGGEST RECOLLECTION (A)  Final   Report Status 05/13/2021 FINAL  Final  Aerobic/Anaerobic Culture w Gram Stain (surgical/deep wound)     Status: None (Preliminary result)   Collection Time: 05/13/21  5:27 PM   Specimen: Gallbladder; Bile  Result Value Ref Range Status   Specimen Description GALL BLADDER  Final   Special Requests NONE  Final   Gram Stain   Final    NO SQUAMOUS EPITHELIAL CELLS SEEN NO WBC SEEN NO ORGANISMS SEEN    Culture   Final    NO GROWTH 3 DAYS NO ANAEROBES ISOLATED; CULTURE IN PROGRESS FOR 5 DAYS Performed at [facility], [place] [address], [place], [place] [address]    Report Status PENDING  Incomplete  Body fluid culture w Gram Stain     Status: None (Preliminary result)   Collection Time: 05/16/21  9:36 AM   Specimen: Lung, Right Upper Lobe; Pleural Fluid  Result Value Ref Range Status   Specimen Description PLEURAL FLUID  Final   Special Requests   Final    LUNG RUL Performed at [facility], [place] [address], [place], [place] [address]    Gram Stain PENDING  Incomplete   Culture PENDING  Incomplete   Report Status PENDING  Incomplete         Radiology Studies: DG Chest 1 View  Result Date: 05/16/2021 CLINICAL DATA:  76 year old female status post thoracentesis. EXAM: CHEST  1 VIEW COMPARISON:  No prior chest x-ray.  Chest CT 05/14/2021. FINDINGS: Lung volumes are normal. No consolidative airspace disease. No definite right pleural effusion. Trace left pleural effusion. No pneumothorax. No pulmonary nodule or mass noted. Pulmonary vasculature and the cardiomediastinal silhouette are within normal limits. Atherosclerosis in the thoracic aorta. Catheter projecting  over the right upper quadrant of the abdomen, compatible with percutaneous biliary drain. IMPRESSION: 1. No postprocedural pneumothorax. 2. Trace left pleural effusion. 3. Aortic atherosclerosis. Electronically Signed   By: Vinnie Langton M.D.   On: 05/16/2021 09:53   CT CHEST WO CONTRAST  Result Date: 05/14/2021 CLINICAL DATA:  Newly diagnosed pancreatic carcinoma.  Staging. * onc * EXAM: CT CHEST WITHOUT CONTRAST TECHNIQUE: Multidetector CT imaging of the chest was performed following the standard protocol without IV contrast. RADIATION DOSE REDUCTION: This exam was performed according to the departmental dose-optimization program which includes automated exposure control, adjustment of the mA and/or kV according to patient size and/or use of iterative reconstruction technique. COMPARISON:  None. FINDINGS: Cardiovascular: No acute findings. Aortic atherosclerotic calcification noted. Mediastinum/Nodes: No masses or pathologically enlarged lymph nodes identified on this unenhanced exam. Lungs/Pleura: Small right pleural effusion is seen with compressive atelectasis in right lower lobe. No suspicious pulmonary nodules or masses are identified. No evidence of central endobronchial obstruction. Right hilar lymph node  calcification and right lower lobe calcified granuloma, consistent with old granulomatous disease. Mild pleural-parenchymal scarring in lateral left lower lobe. Upper Abdomen:  Internal external biliary drain noted. Musculoskeletal:  No suspicious bone lesions. IMPRESSION: No evidence of metastatic disease within the thorax. Small right pleural effusion and right lower lobe compressive atelectasis. Aortic Atherosclerosis (ICD10-I70.0). Electronically Signed   By: Marlaine Hind M.D.   On: 05/14/2021 20:27   US THORACENTESIS ASP PLEURAL SPACE W/IMG GUIDE  Result Date: 05/16/2021 INDICATION: Biliary obstruction due to pancreatic mass. Right pleural effusion. Request for diagnostic and therapeutic thoracentesis. EXAM: ULTRASOUND GUIDED RIGHT THORACENTESIS MEDICATIONS: 1% lidocaine 10 mL COMPLICATIONS: None immediate. PROCEDURE: An ultrasound guided thoracentesis was thoroughly discussed with the patient and questions answered. The benefits, risks, alternatives and complications were also discussed. The patient understands and wishes to proceed with the procedure. Written consent was obtained. Ultrasound was performed to localize and mark an adequate pocket of fluid in the right chest. The area was then prepped and draped in the normal sterile fashion. 1% Lidocaine was used for local anesthesia. Under ultrasound guidance a 6 Fr Safe-T-Centesis catheter was introduced. Thoracentesis was performed. The catheter was removed and a dressing applied. FINDINGS: A total of approximately 150 mL of hazy yellow fluid was removed. Samples were sent to the laboratory as requested by the clinical team. IMPRESSION: Successful ultrasound guided right thoracentesis yielding 150 mL of pleural fluid. No pneumothorax on post-procedure chest x-ray. Procedure performed by: Gareth Eagle, PA-C Electronically Signed   By: Markus Daft M.D.   On: 05/16/2021 11:24        Scheduled Meds:  cholestyramine  4 g Oral BID   diltiazem  180 mg  Oral QHS   enoxaparin (LOVENOX) injection  40 mg Subcutaneous Q24H   feeding supplement (KATE FARMS STANDARD 1.4)  325 mL Oral BID BM   insulin aspart  0-9 Units Subcutaneous TID WC   irbesartan  300 mg Oral Daily   melatonin  3 mg Oral QHS   multivitamin with minerals  1 tablet Oral Daily   polyethylene glycol  17 g Oral BID   sodium chloride flush  10 mL Intracatheter Q12H   sodium chloride flush  10 mL Intracatheter Q12H   sodium chloride flush  3 mL Intravenous Q12H   Continuous Infusions:  ciprofloxacin 400 mg (05/16/21 1006)   metronidazole 500 mg (05/16/21 0530)     LOS: 4 days       Phillips Climes, MD  [facility]   To contact the attending provider between 7A-7P or the covering provider during after hours 7P-7A, please log into the web site [email or web address] and access using universal [place] password for that web site. If you do not have the password, please call the hospital operator.  05/16/2021, 2:09 PM

## 2021-05-16 NOTE — Progress Notes (Signed)
Pt placed on CPAP for bed. RT will cont to monitor.  ?

## 2021-05-16 NOTE — Procedures (Signed)
PROCEDURE SUMMARY: ? ?Successful US guided right thoracentesis. ?Yielded right of hazy yellow fluid. ?Patient tolerated procedure well. ?No immediate complications. ?EBL = trace ? ?Specimen was sent for labs. ? ?Post procedure chest X-ray pending. ? ?Murrell Redden PA-C ?05/16/2021 ?9:18 AM ? ? ? ?

## 2021-05-17 ENCOUNTER — Other Ambulatory Visit (HOSPITAL_COMMUNITY): Payer: Self-pay

## 2021-05-17 ENCOUNTER — Telehealth: Payer: Self-pay | Admitting: Physician Assistant

## 2021-05-17 ENCOUNTER — Encounter (HOSPITAL_COMMUNITY): Payer: Self-pay | Admitting: Gastroenterology

## 2021-05-17 ENCOUNTER — Telehealth: Payer: Self-pay | Admitting: Genetic Counselor

## 2021-05-17 DIAGNOSIS — K831 Obstruction of bile duct: Secondary | ICD-10-CM | POA: Diagnosis not present

## 2021-05-17 DIAGNOSIS — R748 Abnormal levels of other serum enzymes: Secondary | ICD-10-CM | POA: Diagnosis not present

## 2021-05-17 DIAGNOSIS — N179 Acute kidney failure, unspecified: Secondary | ICD-10-CM | POA: Diagnosis not present

## 2021-05-17 DIAGNOSIS — K8689 Other specified diseases of pancreas: Secondary | ICD-10-CM | POA: Diagnosis not present

## 2021-05-17 LAB — CBC
HCT: 31.4 % — ABNORMAL LOW (ref 36.0–46.0)
Hemoglobin: 10.4 g/dL — ABNORMAL LOW (ref 12.0–15.0)
MCH: 27.8 pg (ref 26.0–34.0)
MCHC: 33.1 g/dL (ref 30.0–36.0)
MCV: 84 fL (ref 80.0–100.0)
Platelets: 309 10*3/uL (ref 150–400)
RBC: 3.74 MIL/uL — ABNORMAL LOW (ref 3.87–5.11)
RDW: 17.8 % — ABNORMAL HIGH (ref 11.5–15.5)
WBC: 8.5 10*3/uL (ref 4.0–10.5)
nRBC: 0 % (ref 0.0–0.2)

## 2021-05-17 LAB — BASIC METABOLIC PANEL
Anion gap: 9 (ref 5–15)
BUN: 16 mg/dL (ref 8–23)
CO2: 25 mmol/L (ref 22–32)
Calcium: 8.7 mg/dL — ABNORMAL LOW (ref 8.9–10.3)
Chloride: 103 mmol/L (ref 98–111)
Creatinine, Ser: 1.1 mg/dL — ABNORMAL HIGH (ref 0.44–1.00)
GFR, Estimated: 52 mL/min — ABNORMAL LOW (ref >60)
Glucose, Bld: 141 mg/dL — ABNORMAL HIGH (ref 70–99)
Potassium: 4 mmol/L (ref 3.5–5.1)
Sodium: 137 mmol/L (ref 135–145)

## 2021-05-17 LAB — HEPATIC FUNCTION PANEL
ALT: 308 U/L — ABNORMAL HIGH (ref 0–44)
AST: 57 U/L — ABNORMAL HIGH (ref 15–41)
Albumin: 2.5 g/dL — ABNORMAL LOW (ref 3.5–5.0)
Alkaline Phosphatase: 190 U/L — ABNORMAL HIGH (ref 38–126)
Bilirubin, Direct: 1.4 mg/dL — ABNORMAL HIGH (ref 0.0–0.2)
Indirect Bilirubin: 1.6 mg/dL — ABNORMAL HIGH (ref 0.3–0.9)
Total Bilirubin: 3 mg/dL — ABNORMAL HIGH (ref 0.3–1.2)
Total Protein: 5.8 g/dL — ABNORMAL LOW (ref 6.5–8.1)

## 2021-05-17 LAB — GLUCOSE, CAPILLARY
Glucose-Capillary: 125 mg/dL — ABNORMAL HIGH (ref 70–99)
Glucose-Capillary: 169 mg/dL — ABNORMAL HIGH (ref 70–99)

## 2021-05-17 LAB — CYTOLOGY - NON PAP

## 2021-05-17 MED ORDER — CIPROFLOXACIN HCL 500 MG PO TABS: 500.0000 mg | ORAL_TABLET | Freq: Two times a day (BID) | ORAL | 0 refills | Status: DC

## 2021-05-17 MED ORDER — SODIUM CHLORIDE 0.9% FLUSH
INTRAVENOUS | 0 refills | Status: DC
Start: 2021-05-17 — End: 2021-05-17
  Filled 2021-05-17: qty 30, 30d supply, fill #0

## 2021-05-17 MED ORDER — CIPROFLOXACIN HCL 500 MG PO TABS
500.0000 mg | ORAL_TABLET | Freq: Two times a day (BID) | ORAL | 0 refills | Status: AC
  Filled 2021-05-17: qty 14, 7d supply, fill #0

## 2021-05-17 MED ORDER — METRONIDAZOLE 500 MG PO TABS
500.0000 mg | ORAL_TABLET | Freq: Three times a day (TID) | ORAL | 0 refills | Status: AC
  Filled 2021-05-17: qty 21, 7d supply, fill #0

## 2021-05-17 MED ORDER — TRAMADOL HCL 50 MG PO TABS
50.0000 mg | ORAL_TABLET | Freq: Four times a day (QID) | ORAL | 0 refills | Status: DC | PRN
  Filled 2021-05-17: qty 15, 4d supply, fill #0

## 2021-05-17 MED ORDER — METRONIDAZOLE 500 MG PO TABS: 500.0000 mg | ORAL_TABLET | Freq: Three times a day (TID) | ORAL | 0 refills | Status: DC

## 2021-05-17 MED ORDER — TRAMADOL HCL 50 MG PO TABS: 50.0000 mg | ORAL_TABLET | Freq: Four times a day (QID) | ORAL | 0 refills | Status: DC | PRN

## 2021-05-17 NOTE — Progress Notes (Addendum)
Brief oncology note: ? ?Note plan for discharge to home today.  Cytology from pancreatic mass and from pleural fluid still pending.  Outpatient follow-up appointments have already been scheduled at the cancer center and have been communicated to the patient.  She will be seen by genetic counseling on 3/15 and have a hospital follow-up visit on 3/16. ? ?Future Appointments  ?Date Time Provider Talent  ?05/19/2021 11:00 AM [facility] GEN[facility] None  ?05/19/2021 12:00 PM [facility] LAB [facility] None  ?05/20/2021  3:00 PM Heilingoetter, Cassandra L, PA-C [facility] None  ?06/17/2021 11:00 AM [facility]LE DEXA [facility] GI-BREAST CE  ?07/06/2021  3:30 PM [facility]-IR 1 [facility]-IR [place]  ?  ? ?Mikey Bussing, DNP, AGPCNP-BC, AOCNP ? ?

## 2021-05-17 NOTE — Discharge Instructions (Addendum)
?  Will need to flush drain once daily ?Record OP daily ?She has appt with Dr Burr Medico Thursday 2pm ?Plan per Dr Burr Medico ? ? ?Follow with Primary MD [name], MD in 7 days  ? ?Get CBC, CMP,checked  by Primary MD next visit.  ? ? ?Activity: As tolerated with Full fall precautions use walker/cane & assistance as needed ? ? ?Disposition Home  ? ? ?Diet: Heart Healthy  ? ? ?On your next visit with your primary care physician please Get Medicines reviewed and adjusted. ? ? ?Please request your Prim.MD to go over all Hospital Tests and Procedure/Radiological results at the follow up, please get all Hospital records sent to your Prim MD by signing hospital release before you go home. ? ? ?If you experience worsening of your admission symptoms, develop shortness of breath, life threatening emergency, suicidal or homicidal thoughts you must seek medical attention immediately by calling 911 or calling your MD immediately  if symptoms less severe. ? ?You Must read complete instructions/literature along with all the possible adverse reactions/side effects for all the Medicines you take and that have been prescribed to you. Take any new Medicines after you have completely understood and accpet all the possible adverse reactions/side effects.  ? ?Do not drive, operating heavy machinery, perform activities at heights, swimming or participation in water activities or provide baby sitting services if your were admitted for syncope or siezures until you have seen by Primary MD or a Neurologist and advised to do so again. ? ?Do not drive when taking Pain medications.  ? ? ?Do not take more than prescribed Pain, Sleep and Anxiety Medications ? ?Special Instructions: If you have smoked or chewed Tobacco  in the last 2 yrs please stop smoking, stop any regular Alcohol  and or any Recreational drug use. ? ?Wear Seat belts while driving. ? ? ?Please note ? ?You were cared for by a hospitalist during your hospital stay. If you have any  questions about your discharge medications or the care you received while you were in the hospital after you are discharged, you can call the unit and asked to speak with the hospitalist on call if the hospitalist that took care of you is not available. Once you are discharged, your primary care physician will handle any further medical issues. Please note that NO REFILLS for any discharge medications will be authorized once you are discharged, as it is imperative that you return to your primary care physician (or establish a relationship with a primary care physician if you do not have one) for your aftercare needs so that they can reassess your need for medications and monitor your lab values.  ?

## 2021-05-17 NOTE — Discharge Summary (Signed)
Physician Discharge Summary  Emy Angevine UJW:[id number] DOB: 1945/07/28 DOA: 05/11/2021  PCP: [name], MD  Admit date: 05/11/2021 Discharge date: 05/17/2021  Admitted From: Home Disposition:  Home   Recommendations for Outpatient Follow-up:  Follow up with PCP in 1-2 weeks Please obtain CMP/CBC in one week Please follow up on the following pending results: Final results of cytology of her EBUS and pleural effusion, patient to follow with oncology on Thursday 3/16.  Home Health:YES Equipment/Devices: DME 3 in1   Discharge Condition:stable CODE STATUS: FULLDiet recommendation: Heart Healthy   Brief/Interim Summary:  Shaune Malacara is a 76 y.o. 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  Significant for painless obstructive jaundice, and pancreatic mass, initial attempt for ERCP were unsuccessful for stent placement, had ANTEROGRADE CHOLANGIOGRAM PERCUTANEOUS BILIARY DRAINAGE CATHETER PLACEMENT via CHOLECYSTOSTOMY ACCESS by IR on 3/9, she went for EUS 05/14/2021. -CT chest without contrast for staging obtained, with no evidence of lung mets, but with small right pleural effusion status post diagnostic thoracentesis 3/12.   Procedures  ERCP 3/8 Biliary drain by IR 3/9 EUS 3/10 Right Thoracentesis 3/12  Obstructive jaundice secondary to pancreatic mass - Patient presented due to concerns of jaundice with severe itching.  Significant elevated LFTs, concern for pancreatic malignancy . -GI input greatly appreciated, she went for ERCP 3/9 , with unsuccessful stent placement. - ANTEROGRADE CHOLANGIOGRAM PERCUTANEOUS BILIARY DRAINAGE CATHETER PLACEMENT via CHOLECYSTOSTOMY ACCESS by IR on 3/9, she went for EUS 05/14/2021. -Treated  with IV Ciprofloxacin  and Flagyl .  As she  had a retroperitoneal  perforation during ERCP on 3/9.  She will need total of 10 days of antibiotic treatment.  Will receive another 7 days of oral antibiotics on discharge - EUS 3/10 FNA of panc mass, cytology results pending -Tolerating regular diet. -Follow on final pathology result on both pleural effusion and FNA from EUS on 3/10 -CA 19-9 within normal limit -Total bilirubin trending down, continue to monitor LFTs closely. -CT chest with no evidence of lung metastasis but significant for mild pleural effusion.   Pleural effusion -Mild, status post thoracentesis, cytology was sent and pending, appears to be exudative given LDH of fluid of 247 -Follow on final cytology     AKI (acute kidney injury) ([place]) -Treated with IV fluids   Essential hypertension Continue with home medications Normocytic anemia -Stable, continue to monitor     OSA on CPAP Patient reports using CPAP at night as prescribed. - Continue CPAP per RT at night   Hyperglycemia With A1c of 6.5, continue to monitor CBG closely with insulin sliding scale.     Elevated liver enzymes Secondary to above.  Total bili trending down.   Hyperbilirubinemia Secondary to above.    Hypokalemia Repleted       Discharge Diagnoses:  Principal Problem:   Obstructive jaundice secondary to pancreatic mass Active Problems:   Pancreatic mass   Possible AKI (acute kidney injury) ([place])   Normocytic anemia   Essential hypertension   Hypokalemia   Abnormal urinalysis   Hyperbilirubinemia   Elevated liver enzymes   Hyperglycemia   OSA on CPAP    Discharge Instructions  Discharge Instructions     Diet - low sodium heart healthy   Complete by: As directed    Discharge instructions   Complete by: As directed  Will need to flush drain once daily Record OP daily She has appt with Dr Burr Medico Thursday 2pm Plan per Dr Burr Medico   Follow with Primary MD [name], MD in 7 days   Get CBC, CMP,checked  by Primary MD next visit.     Activity: As tolerated with Full fall precautions use walker/cane & assistance as needed   Disposition Home    Diet: Heart Healthy    On your next visit with your primary care physician please Get Medicines reviewed and adjusted.   Please request your Prim.MD to go over all Hospital Tests and Procedure/Radiological results at the follow up, please get all Hospital records sent to your Prim MD by signing hospital release before you go home.   If you experience worsening of your admission symptoms, develop shortness of breath, life threatening emergency, suicidal or homicidal thoughts you must seek medical attention immediately by calling 911 or calling your MD immediately  if symptoms less severe.  You Must read complete instructions/literature along with all the possible adverse reactions/side effects for all the Medicines you take and that have been prescribed to you. Take any new Medicines after you have completely understood and accpet all the possible adverse reactions/side effects.   Do not drive, operating heavy machinery, perform activities at heights, swimming or participation in water activities or provide baby sitting services if your were admitted for syncope or siezures until you have seen by Primary MD or a Neurologist and advised to do so again.  Do not drive when taking Pain medications.    Do not take more than prescribed Pain, Sleep and Anxiety Medications  Special Instructions: If you have smoked or chewed Tobacco  in the last 2 yrs please stop smoking, stop any regular Alcohol  and or any Recreational drug use.  Wear Seat belts while driving.   Please note  You were cared for by a hospitalist during your hospital stay. If you have any questions about your discharge medications or the care you received while you were in the hospital after you are discharged, you can call the unit and asked to speak with the hospitalist on call if the hospitalist that took care  of you is not available. Once you are discharged, your primary care physician will handle any further medical issues. Please note that NO REFILLS for any discharge medications will be authorized once you are discharged, as it is imperative that you return to your primary care physician (or establish a relationship with a primary care physician if you do not have one) for your aftercare needs so that they can reassess your need for medications and monitor your lab values.   Increase activity slowly   Complete by: As directed    No wound care   Complete by: As directed    Will need to flush drain once daily  Record OP daily Dressing changes QD or PRN if soiled.      Allergies as of 05/17/2021       Reactions   Amlodipine Besylate    Other reaction(s): severe HA   Hydrochlorothiazide    Other reaction(s): hallucinations   Lexapro [escitalopram Oxalate]    Pt stated, "It gave me hallucinations"   Oxycodone    Penicillins Other (See Comments)        Medication List     TAKE these medications    ciprofloxacin 500 MG tablet Commonly known as: Cipro Take 1 tablet (500 mg total) by mouth 2 (two) times daily  for 7 days.   diltiazem 180 MG 24 hr capsule Commonly known as: DILACOR XR Take 180 mg by mouth at bedtime.   hydrOXYzine 25 MG tablet Commonly known as: ATARAX Take 25 mg by mouth every 8 (eight) hours as needed for anxiety or itching.   melatonin 3 MG Tabs tablet Take 3 mg by mouth at bedtime.   metroNIDAZOLE 500 MG tablet Commonly known as: Flagyl Take 1 tablet (500 mg total) by mouth 3 (three) times daily for 7 days.   NON FORMULARY CPAP at bedtime   olmesartan 40 MG tablet Commonly known as: BENICAR Take 40 mg by mouth at bedtime.   traMADol 50 MG tablet Commonly known as: ULTRAM Take 1 tablet (50 mg total) by mouth every 6 (six) hours as needed for severe pain.   triamcinolone lotion 0.1 % Commonly known as: KENALOG Apply 0.1 application. topically See  admin instructions. Bid x 7 days   VITAMIN B COMPLEX PO Take 1 capsule by mouth daily.               Durable Medical Equipment  (From admission, onward)           Start     Ordered   05/16/21 0746  For home use only DME 3 n 1  Once        05/16/21 0745            Follow-up Information     Mugweru, Wille Glaser, MD Follow up.   Specialties: Interventional Radiology, Diagnostic Radiology, Radiology Why: Schedulers will call to arrange follow-up. Contact information: [address] [place] [address] [phone]         Truitt Merle, MD Follow up on 05/20/2021.   Specialties: Hematology, Oncology Why: at 2 pm Contact information: [place] [address] [phone]                Allergies  Allergen Reactions   Amlodipine Besylate     Other reaction(s): severe HA   Hydrochlorothiazide     Other reaction(s): hallucinations   Lexapro [Escitalopram Oxalate]     Pt stated, "It gave me hallucinations"   Oxycodone    Penicillins Other (See Comments)    Consultations: IR  Dr. Benson [place] GI Dr. Burr Medico oncology : DG Chest 1 View  Result Date: 05/16/2021 CLINICAL DATA:  76 year old female status post thoracentesis. EXAM: CHEST  1 VIEW COMPARISON:  No prior chest x-ray.  Chest CT 05/14/2021. FINDINGS: Lung volumes are normal. No consolidative airspace disease. No definite right pleural effusion. Trace left pleural effusion. No pneumothorax. No pulmonary nodule or mass noted. Pulmonary vasculature and the cardiomediastinal silhouette are within normal limits. Atherosclerosis in the thoracic aorta. Catheter projecting over the right upper quadrant of the abdomen, compatible with percutaneous biliary drain. IMPRESSION: 1. No postprocedural pneumothorax. 2. Trace left pleural effusion. 3. Aortic atherosclerosis. Electronically Signed   By: Vinnie Langton M.D.   On: 05/16/2021 09:53   CT CHEST WO CONTRAST  Result Date:  05/14/2021 CLINICAL DATA:  Newly diagnosed pancreatic carcinoma.  Staging. * onc * EXAM: CT CHEST WITHOUT CONTRAST TECHNIQUE: Multidetector CT imaging of the chest was performed following the standard protocol without IV contrast. RADIATION DOSE REDUCTION: This exam was performed according to the departmental dose-optimization program which includes automated exposure control, adjustment of the mA and/or kV according to patient size and/or use of iterative reconstruction technique. COMPARISON:  None. FINDINGS: Cardiovascular: No acute findings. Aortic atherosclerotic calcification noted. Mediastinum/Nodes:  No masses or pathologically enlarged lymph nodes identified on this unenhanced exam. Lungs/Pleura: Small right pleural effusion is seen with compressive atelectasis in right lower lobe. No suspicious pulmonary nodules or masses are identified. No evidence of central endobronchial obstruction. Right hilar lymph node calcification and right lower lobe calcified granuloma, consistent with old granulomatous disease. Mild pleural-parenchymal scarring in lateral left lower lobe. Upper Abdomen:  Internal external biliary drain noted. Musculoskeletal:  No suspicious bone lesions. IMPRESSION: No evidence of metastatic disease within the thorax. Small right pleural effusion and right lower lobe compressive atelectasis. Aortic Atherosclerosis (ICD10-I70.0). Electronically Signed   By: Marlaine Hind M.D.   On: 05/14/2021 20:27   US Abdomen Complete  Result Date: 05/11/2021 CLINICAL DATA:  Left upper quadrant tenderness. EXAM: ABDOMEN ULTRASOUND COMPLETE COMPARISON:  CT 07/21/2020 FINDINGS: Gallbladder: Distended. No gallstones or wall thickening visualized. No sonographic Murphy sign noted by sonographer. Common bile duct: Diameter: 7 mm proximally, flares to 10 mm distally Liver: No focal lesion identified. Increased and heterogeneous in parenchymal echogenicity. No capsular nodularity. Portal vein is patent on color  Doppler imaging with normal direction of blood flow towards the liver. IVC: No abnormality visualized. Pancreas: Dilated pancreatic duct at 10 mm. No pancreatic mass is seen. Spleen: Normal in size. No focal sonographic abnormality. Calcified granuloma on CT are not seen. Right Kidney: Length: 9.8 cm. Suggestion of mild hydronephrosis. No visualized renal calculi. Lower pole cyst on prior CT is not seen by ultrasound. Left Kidney: Length: 9.5 cm. No hydronephrosis. No visualized renal calculi. No focal renal abnormality. Abdominal aorta: No aneurysm visualized. Other findings: No abdominal ascites. IMPRESSION: 1. Dilatation of the common bile duct and pancreatic ducts. This can be seen in the setting of pancreatic head mass, although no focal pancreatic lesion is seen by ultrasound. Recommend further assessment with created protocol MRI, or contrast-enhanced abdominopelvic CT if patient cannot tolerate breath hold technique. 2. Mild right hydronephrosis, of unknown etiology. 3. Hepatic steatosis. Electronically Signed   By: Keith Rake M.D.   On: 05/11/2021 21:25   CT ABDOMEN PELVIS W CONTRAST  Result Date: 05/12/2021 CLINICAL DATA:  Abdominal pain. EXAM: CT ABDOMEN AND PELVIS WITH CONTRAST TECHNIQUE: Multidetector CT imaging of the abdomen and pelvis was performed using the standard protocol following bolus administration of intravenous contrast. RADIATION DOSE REDUCTION: This exam was performed according to the departmental dose-optimization program which includes automated exposure control, adjustment of the mA and/or kV according to patient size and/or use of iterative reconstruction technique. CONTRAST:  62m OMNIPAQUE IOHEXOL 300 MG/ML  SOLN COMPARISON:  Jul 21, 2020 FINDINGS: Lower chest: No acute abnormality. Hepatobiliary: No focal liver abnormality is seen. Marked severity intrahepatic biliary dilatation is noted. The gallbladder is markedly distended without evidence of gallstones are gallbladder  wall thickening. The common bile duct is dilated and measures approximately 13 mm in diameter. Abrupt narrowing of the common bile duct is seen at the level of the pancreatic head. (Axial CT images 28 through 31, CT series 3. This represents a new finding when compared to the prior study. Pancreas: Pancreatic duct is also dilated (12 mm in diameter) with abrupt narrowing seen within the pancreatic head (axial CT images 30 through 32, CT series 3). This represents a new finding when compared to the prior exam. Mild prominence of the pancreatic head is noted without an underlying mass lesion. No peripancreatic inflammatory fat stranding is seen. Spleen: Normal in size without focal abnormality. Adrenals/Urinary Tract: Adrenal glands are unremarkable. Kidneys are normal  in size, without renal calculi or hydronephrosis. Small bilateral simple renal cysts are noted. Bladder is unremarkable. Stomach/Bowel: Stomach is within normal limits. Appendix appears normal. No evidence of bowel wall thickening, distention, or inflammatory changes. Noninflamed diverticula are seen throughout the large bowel. Vascular/Lymphatic: Aortic atherosclerosis. No enlarged abdominal or pelvic lymph nodes. Reproductive: Uterus and bilateral adnexa are unremarkable. Other: There is a 2.1 cm 1.0 cm fat containing umbilical hernia. No abdominopelvic ascites. Musculoskeletal: Multilevel degenerative changes are noted throughout the lumbar spine. IMPRESSION: 1. Marked severity intrahepatic and extrahepatic biliary dilatation with an abrupt narrowing of the common bile duct and pancreatic duct at the level of the pancreatic head. Further correlation with MRCP is recommended. 2. Markedly distended gallbladder without evidence of cholelithiasis or acute cholecystitis. 3. Colonic diverticulosis. 4. Small bilateral simple renal cysts. 5. 2.1 cm fat containing umbilical hernia. 6. Aortic atherosclerosis. Aortic Atherosclerosis (ICD10-I70.0).  Electronically Signed   By: Virgina Norfolk M.D.   On: 05/12/2021 01:23   DG ERCP  Result Date: 05/13/2021 CLINICAL DATA:  Biliary obstruction. EXAM: ERCP COMPARISON:  CT AP, 05/12/2021 and 07/21/2020.  MRCP, 05/12/2021. FLUOROSCOPY: Exposure Index (as provided by the fluoroscopic device): 37 mGy Kerma FINDINGS: Limited oblique planar images of the RIGHT upper quadrant obtained C-arm. Images demonstrating flexible endoscopy, attempted biliary duct cannulation and retrograde cholangiogram. IMPRESSION: Fluoroscopic imaging for ERCP. No discrete retrograde biliary opacification For complete description of intra procedural findings, please see performing service dictation. Electronically Signed   By: Michaelle Birks M.D.   On: 05/13/2021 09:40   MR ABDOMEN MRCP W WO CONTAST  Result Date: 05/12/2021 CLINICAL DATA:  76 year old female with history of jaundice and abdominal pain. Biliary obstruction noted on recent CT examination. Follow-up study. EXAM: MRI ABDOMEN WITHOUT AND WITH CONTRAST (INCLUDING MRCP) TECHNIQUE: Multiplanar multisequence MR imaging of the abdomen was performed both before and after the administration of intravenous contrast. Heavily T2-weighted images of the biliary and pancreatic ducts were obtained, and three-dimensional MRCP images were rendered by post processing. CONTRAST:  60m GADAVIST GADOBUTROL 1 MMOL/ML IV SOLN COMPARISON:  No prior abdominal MRI. CT of the abdomen and pelvis 05/12/2021. FINDINGS: Comment: Portions of today's examination are limited by considerable patient respiratory motion. Lower chest: Unremarkable. Hepatobiliary: Within the limitations of today's examination, there are no definite suspicious cystic or solid hepatic lesions. Severe intra and extrahepatic biliary ductal dilatation is noted. Gallbladder is moderately distended. Gallbladder wall thickness is normal. No filling defects to suggest gallstones. No definite pericholecystic fluid. No filling defects within  the common bile duct to suggest choledocholithiasis. Common bile duct measures up to 14 mm in the porta hepatis. There is abrupt cut off of the distal common bile duct in the region of the pancreatic head. Pancreas: MRCP images demonstrate severe diffuse pancreatic ductal dilatation measuring up to 8 mm in the pancreatic body. In the pancreatic head there is a enhancing mass-like area which is poorly delineated on today's motion limited examination, but estimated to measure approximately 3.6 x 2.1 x 4.2 cm (axial image 55 of series 22 and coronal image 51 of series 31), concerning for pancreatic neoplasm. This causes abrupt cut off of the distal pancreatic duct, as well as the distal common bile duct. No peripancreatic fluid collections or inflammatory changes. Spleen:  Unremarkable. Adrenals/Urinary Tract: Multiple small T1 hypointense, T2 hyperintense, nonenhancing lesions in both kidneys are compatible with tiny simple cysts. No hydroureteronephrosis in the visualized portions of the abdomen. Bilateral adrenal glands are normal in appearance. Stomach/Bowel: Visualized  portions are unremarkable. Vascular/Lymphatic: No aneurysm identified in the visualized abdominal vasculature. No lymphadenopathy noted in the abdomen Other: No significant volume of ascites noted in the visualized portions of the peritoneal cavity. Musculoskeletal: No aggressive appearing osseous lesions are noted in the visualized portions of the skeleton. IMPRESSION: 1. 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 Further evaluation with endoscopic ultrasound is strongly recommended at this time. Electronically Signed   By: Vinnie Langton M.D.   On: 05/12/2021 07:05   IR INT EXT BILIARY DRAIN WITH CHOLANGIOGRAM  Result Date: 05/14/2021 INDICATION: Pancreatic mass.  Malignant biliary obstruction. EXAM: Procedures: 1.  ANTEROGRADE CHOLANGIOGRAM 2. PERCUTANEOUS TRANSHEPATIC BILIARY DRAINAGE CATHETER PLACEMENT VIA CHOLECYSTOSTOMY ACCESS Performing Physician: Michaelle Birks, MD Assistant(s): Daryll Brod, MD A qualified trainee/resident or advanced practice provider (APP) was not immediately available to assist with this case. COMPARISON:  ERCP, earlier same day. MRI abdomen and CT AP, 05/12/2021. US Abdomen, 05/11/2021. MEDICATIONS: The patient was on scheduled IV antibiotics, including ciprofloxacin and Flagyl. CONTRAST:  55m OMNIPAQUE IOHEXOL 300 MG/ML SOLN - administered into the biliary tree. ANESTHESIA/SEDATION: Moderate (conscious) sedation was employed during this procedure. A total of Versed 4 mg and Fentanyl 200 mcg was administered intravenously. Moderate Sedation Time: 91 minutes. The patient's level of consciousness and vital signs were monitored continuously by radiology nursing throughout the procedure under my direct supervision. FLUOROSCOPY TIME:  32 minutes, 6 seconds. Fluoroscopic dose; 177 mGy. COMPLICATIONS: None immediate. TECHNIQUE: Informed written consent was obtained from the patient after a discussion of the risks, benefits and alternatives to treatment. Questions regarding the procedure were encouraged and answered. A timeout was performed prior to the initiation of the procedure. The right upper abdominal quadrant was prepped and draped in the usual sterile fashion, and a sterile drape was applied covering the operative field. Maximum barrier sterile technique with sterile gowns and gloves were used for the procedure. A timeout was performed prior to the initiation of the procedure. Ultrasound scanning of the right upper quadrant demonstrates a markedly dilated gallbladder. Utilizing a transhepatic approach, a 22 gauge needle was advanced into the gallbladder under direct ultrasound guidance. An ultrasound image was saved for documentation purposes. Appropriate intraluminal puncture was confirmed with the  efflux of bile and advancement of an 0.018 wire into the gallbladder lumen. The needle was exchanged for an A[place]. A small amount of contrast was injected to confirm appropriate intraluminal positioning. Over a Benson wire, a 5 Fr, 35 cm Brite tip sheath was advanced into the gallbladder and positioned at the gallbladder neck/cystic duct. Using a 0.018 inch V18 Glidewire and 90 cm Navicross catheter access through the cystic duct into the common bile duct, then finally past the obstruction and into the duodenum was obtained. Transpapillary anchoring with an 0.035 inch Amplatz wire was placed. Over the wire, a 10 Fr internal/external biliary catheter was advanced over wire, across the obstruction, with coil ultimately locked within the duodenum. Contrast was injected and a completion radiographs were obtained. The catheter was connected to a drainage bag which yielded the brisk return of bile. The catheter was secured to the skin with an interrupted suture and StatLock device. Dressings were applied. The patient tolerated the procedure well without immediate postprocedural complication. FINDINGS: Sonographic evaluation of the liver demonstrates marked intrahepatic biliary ductal dilatation as was demonstrated on preceding abdominal CT. Under direct ultrasound guidance, a dilated gallbladder  was accessed via transhepatic approach then cannulation across the cystic duct was performed, allowing for placement of a 10 [place] internalized/externalized biliary drainage catheter with end ultimately coiled and locked within the duodenum. Transpapillary access into duodenum after considerable effort. Antegrade cholangiogram demonstrates marked dilatation of the CBD and intrahepatic biliary tree with communication between the right and left biliary trees at the level of the hilum.Distal obstruction resulting in flush occlusion on cholangiogram. IMPRESSION: Successful placement of a 10 Fr percutaneous  internalized/externalized biliary drainage catheter via cholecystostomy access, with catheter end coiled within the duodenum. PLAN: 1. Flush tube w 10 mL sterile NS and record drain output qShift. 2. Follow up for routine tube exchange in 2 month(s). Michaelle Birks, MD [facility] Electronically Signed   By: Michaelle Birks M.D.   On: 05/14/2021 21:06   US THORACENTESIS ASP PLEURAL SPACE W/IMG GUIDE  Result Date: 05/16/2021 INDICATION: Biliary obstruction due to pancreatic mass. Right pleural effusion. Request for diagnostic and therapeutic thoracentesis. EXAM: ULTRASOUND GUIDED RIGHT THORACENTESIS MEDICATIONS: 1% lidocaine 10 mL COMPLICATIONS: None immediate. PROCEDURE: An ultrasound guided thoracentesis was thoroughly discussed with the patient and questions answered. The benefits, risks, alternatives and complications were also discussed. The patient understands and wishes to proceed with the procedure. Written consent was obtained. Ultrasound was performed to localize and mark an adequate pocket of fluid in the right chest. The area was then prepped and draped in the normal sterile fashion. 1% Lidocaine was used for local anesthesia. Under ultrasound guidance a 6 Fr Safe-T-Centesis catheter was introduced. Thoracentesis was performed. The catheter was removed and a dressing applied. FINDINGS: A total of approximately 150 mL of hazy yellow fluid was removed. Samples were sent to the laboratory as requested by the clinical team. IMPRESSION: Successful ultrasound guided right thoracentesis yielding 150 mL of pleural fluid. No pneumothorax on post-procedure chest x-ray. Procedure performed by: Gareth Eagle, PA-C Electronically Signed   By: Markus Daft M.D.   On: 05/16/2021 11:24      Subjective:  Tolerating regular diet, no nausea, no vomiting Discharge Exam: Vitals:   05/17/21 0601 05/17/21 0746  BP:  138/64  Pulse: 64 63  Resp: 19 15  Temp:  98.2  F (36.8 C)  SpO2: 97% 97%   Vitals:   05/17/21 0431 05/17/21 0437 05/17/21 0601 05/17/21 0746  BP: 140/65   138/64  Pulse: 66 65 64 63  Resp: '17 17 19 15  '$ Temp:  98.4 F (36.9 C)  98.2 F (36.8 C)  TempSrc:  Oral  Oral  SpO2: 96% 97% 97% 97%  Weight:   66.2 kg   Height:        General: Pt is alert, awake, not in acute distress Cardiovascular: RRR, S1/S2 +, no rubs, no gallops Respiratory: CTA bilaterally, no wheezing, no rhonchi Abdominal: Soft, NT, ND, bowel sounds +, mild right upper quadrant tenderness Extremities: no edema, no cyanosis    The results of significant diagnostics from this hospitalization (including imaging, microbiology, ancillary and laboratory) are listed below for reference.     Microbiology: Recent Results (from the past 240 hour(s))  Resp Panel by RT-PCR (Flu A&B, Covid) Nasopharyngeal Swab     Status: None   Collection Time: 05/12/21  4:37 AM   Specimen: Nasopharyngeal Swab; Nasopharyngeal(NP) swabs in vial transport medium  Result Value Ref Range Status   SARS Coronavirus 2 by RT PCR NEGATIVE NEGATIVE Final    Comment: (NOTE) SARS-CoV-2 target nucleic acids are NOT DETECTED.  The SARS-CoV-2 RNA is generally detectable in upper respiratory specimens during the acute phase of infection. The lowest concentration of SARS-CoV-2 viral copies this assay can detect is 138 copies/mL. A negative result does not preclude SARS-Cov-2 infection and should not be used as the sole basis for treatment or other patient management decisions. A negative result may occur with  improper specimen collection/handling, submission of specimen other than nasopharyngeal swab, presence of viral mutation(s) within the areas targeted by this assay, and inadequate number of viral copies(<138 copies/mL). A negative result must be combined with clinical observations, patient history, and epidemiological information. The expected result is Negative.  Fact Sheet for  Patients:  EntrepreneurPulse.com.au  Fact Sheet for Healthcare Providers:  IncredibleEmployment.be  This test is no t yet approved or cleared by the [place] FDA and  has been authorized for detection and/or diagnosis of SARS-CoV-2 by FDA under an Emergency Use Authorization (EUA). This EUA will remain  in effect (meaning this test can be used) for the duration of the COVID-19 declaration under Section 564(b)(1) of the Act, 21 U.S.C.section 360bbb-3(b)(1), unless the authorization is terminated  or revoked sooner.       Influenza A by PCR NEGATIVE NEGATIVE Final   Influenza B by PCR NEGATIVE NEGATIVE Final    Comment: (NOTE) The Xpert Xpress SARS-CoV-2/FLU/RSV plus assay is intended as an aid in the diagnosis of influenza from Nasopharyngeal swab specimens and should not be used as a sole basis for treatment. Nasal washings and aspirates are unacceptable for Xpert Xpress SARS-CoV-2/FLU/RSV testing.  Fact Sheet for Patients: EntrepreneurPulse.com.au  Fact Sheet for Healthcare Providers: IncredibleEmployment.be  This test is not yet approved or cleared by the [place] FDA and has been authorized for detection and/or diagnosis of SARS-CoV-2 by FDA under an Emergency Use Authorization (EUA). This EUA will remain in effect (meaning this test can be used) for the duration of the COVID-19 declaration under Section 564(b)(1) of the Act, 21 U.S.C. section 360bbb-3(b)(1), unless the authorization is terminated or revoked.  Performed at [facility], [place] [address], [place], [place] [address]   Urine Culture     Status: Abnormal   Collection Time: 05/12/21 11:18 AM   Specimen: Urine, Random  Result Value Ref Range Status   Specimen Description URINE, RANDOM  Final   Special Requests   Final    Immunocompromised Performed at [facility], [place] [address], [place], [place] [address]     Culture MULTIPLE SPECIES PRESENT, SUGGEST RECOLLECTION (A)  Final   Report Status 05/13/2021 FINAL  Final  Aerobic/Anaerobic Culture w Gram Stain (surgical/deep wound)     Status: None (Preliminary result)   Collection Time: 05/13/21  5:27 PM   Specimen: Gallbladder; Bile  Result Value Ref Range Status   Specimen Description GALL BLADDER  Final   Special Requests NONE  Final   Gram Stain   Final    NO SQUAMOUS EPITHELIAL CELLS SEEN NO WBC SEEN NO ORGANISMS SEEN    Culture   Final    NO GROWTH 4 DAYS NO ANAEROBES ISOLATED; CULTURE IN PROGRESS FOR 5 DAYS Performed at [facility], [place] [address] [place], [place] [address]    Report Status PENDING  Incomplete  Body fluid culture w Gram Stain     Status: None (Preliminary result)   Collection Time: 05/16/21  9:36 AM   Specimen: Lung, Right Upper Lobe; Pleural Fluid  Result Value Ref Range Status   Specimen Description PLEURAL FLUID  Final   Special Requests LUNG RUL  Final   Gram Stain   Final    FEW WBC PRESENT,BOTH PMN AND MONONUCLEAR NO ORGANISMS SEEN    Culture   Final    NO GROWTH 1 DAY Performed at [facility], [address] [place], [place] [address]    Report Status PENDING  Incomplete     Labs: BNP (last 3 results) No results for input(s): BNP in the last 8760 hours. Basic Metabolic Panel: Recent Labs  Lab 05/13/21 0241 05/14/21 0159 05/15/21 0045 05/16/21 0011 05/17/21 0035  NA 137 134* 135 134* 137  K 3.6 4.4 3.6 3.2* 4.0  CL 106 101 103 101 103  CO2 21* '24 24 24 25  '$ GLUCOSE 147* 219* 189* 172* 141*  BUN '16 16 19 14 16  '$ CREATININE 1.14* 1.07* 1.12* 1.03* 1.10*  CALCIUM 8.7* 8.3* 8.2* 8.2* 8.7*   Liver Function Tests: Recent Labs  Lab 05/13/21 0241 05/14/21 0159 05/15/21 0045 05/16/21 0011 05/17/21 0035  AST 392* 420* 162* 70* 57*  ALT 856* 915* 593* 390* 308*  ALKPHOS 336* 340* 241* 209* 190*  BILITOT 8.2* 5.1* 3.3* 3.0* 3.0*  PROT 5.7* 5.6* 5.3* 5.5* 5.8*  ALBUMIN 2.9* 2.7*  2.4* 2.3* 2.5*   Recent Labs  Lab 05/11/21 2030  LIPASE 39   No results for input(s): AMMONIA in the last 168 hours. CBC: Recent Labs  Lab 05/11/21 2030 05/13/21 0241 05/14/21 0159 05/15/21 0045 05/16/21 0011 05/17/21 0035  WBC 6.6 6.3 13.3* 13.0* 11.3* 8.5  NEUTROABS 4.2  --   --   --   --   --   HGB 11.5* 10.3* 11.2* 10.1* 10.0* 10.4*  HCT 34.7* 29.5* 32.8* 29.4* 29.5* 31.4*  MCV 82.0 80.4 81.4 82.4 83.6 84.0  PLT 307 254 267 228 236 309   Cardiac Enzymes: No results for input(s): CKTOTAL, CKMB, CKMBINDEX, TROPONINI in the last 168 hours. BNP: Invalid input(s): POCBNP CBG: Recent Labs  Lab 05/16/21 0817 05/16/21 1200 05/16/21 1623 05/16/21 2056 05/17/21 0748  GLUCAP 122* 146* 117* 154* 125*   D-Dimer No results for input(s): DDIMER in the last 72 hours. Hgb A1c No results for input(s): HGBA1C in the last 72 hours. Lipid Profile No results for input(s): CHOL, HDL, LDLCALC, TRIG, CHOLHDL, LDLDIRECT in the last 72 hours. Thyroid function studies No results for input(s): TSH, T4TOTAL, T3FREE, THYROIDAB in the last 72 hours.  Invalid input(s): FREET3 Anemia work up No results for input(s): VITAMINB12, FOLATE, FERRITIN, TIBC, IRON, RETICCTPCT in the last 72 hours. Urinalysis    Component Value Date/Time   COLORURINE AMBER (A) 05/11/2021 2030   APPEARANCEUR CLEAR 05/11/2021 2030   [place] 1.009 05/11/2021 2030   [place] 5.0 05/11/2021 2030   GLUCOSEU NEGATIVE 05/11/2021 2030   [place] (A) 05/11/2021 2030   [place] NEGATIVE 05/11/2021 2030   [place] 05/11/2021 2030   PROTEINUR NEGATIVE 05/11/2021 2030   NITRITE NEGATIVE 05/11/2021 2030   LEUKOCYTESUR TRACE (A) 05/11/2021 2030   Sepsis Labs Invalid input(s): PROCALCITONIN,  WBC,  LACTICIDVEN Microbiology Recent Results (from the past 240 hour(s))  Resp Panel by RT-PCR (Flu A&B, Covid) Nasopharyngeal Swab     Status: None   Collection Time: 05/12/21  4:37 AM   Specimen:  Nasopharyngeal Swab; Nasopharyngeal(NP) swabs in vial transport medium  Result Value Ref Range Status   SARS Coronavirus 2 by RT PCR NEGATIVE NEGATIVE Final    Comment: (NOTE) SARS-CoV-2 target nucleic acids are NOT DETECTED.  The  SARS-CoV-2 RNA is generally detectable in upper respiratory specimens during the acute phase of infection. The lowest concentration of SARS-CoV-2 viral copies this assay can detect is 138 copies/mL. A negative result does not preclude SARS-Cov-2 infection and should not be used as the sole basis for treatment or other patient management decisions. A negative result may occur with  improper specimen collection/handling, submission of specimen other than nasopharyngeal swab, presence of viral mutation(s) within the areas targeted by this assay, and inadequate number of viral copies(<138 copies/mL). A negative result must be combined with clinical observations, patient history, and epidemiological information. The expected result is Negative.  Fact Sheet for Patients:  EntrepreneurPulse.com.au  Fact Sheet for Healthcare Providers:  IncredibleEmployment.be  This test is no t yet approved or cleared by the [place] FDA and  has been authorized for detection and/or diagnosis of SARS-CoV-2 by FDA under an Emergency Use Authorization (EUA). This EUA will remain  in effect (meaning this test can be used) for the duration of the COVID-19 declaration under Section 564(b)(1) of the Act, 21 U.S.C.section 360bbb-3(b)(1), unless the authorization is terminated  or revoked sooner.       Influenza A by PCR NEGATIVE NEGATIVE Final   Influenza B by PCR NEGATIVE NEGATIVE Final    Comment: (NOTE) The Xpert Xpress SARS-CoV-2/FLU/RSV plus assay is intended as an aid in the diagnosis of influenza from Nasopharyngeal swab specimens and should not be used as a sole basis for treatment. Nasal washings and aspirates are unacceptable for  Xpert Xpress SARS-CoV-2/FLU/RSV testing.  Fact Sheet for Patients: EntrepreneurPulse.com.au  Fact Sheet for Healthcare Providers: IncredibleEmployment.be  This test is not yet approved or cleared by the [place] FDA and has been authorized for detection and/or diagnosis of SARS-CoV-2 by FDA under an Emergency Use Authorization (EUA). This EUA will remain in effect (meaning this test can be used) for the duration of the COVID-19 declaration under Section 564(b)(1) of the Act, 21 U.S.C. section 360bbb-3(b)(1), unless the authorization is terminated or revoked.  Performed at [facility], [place] [address] [place], [place] [address]   Urine Culture     Status: Abnormal   Collection Time: 05/12/21 11:18 AM   Specimen: Urine, Random  Result Value Ref Range Status   Specimen Description URINE, RANDOM  Final   Special Requests   Final    Immunocompromised Performed at [facility], [place] [address] [place], [place] [address]    Culture MULTIPLE SPECIES PRESENT, SUGGEST RECOLLECTION (A)  Final   Report Status 05/13/2021 FINAL  Final  Aerobic/Anaerobic Culture w Gram Stain (surgical/deep wound)     Status: None (Preliminary result)   Collection Time: 05/13/21  5:27 PM   Specimen: Gallbladder; Bile  Result Value Ref Range Status   Specimen Description GALL BLADDER  Final   Special Requests NONE  Final   Gram Stain   Final    NO SQUAMOUS EPITHELIAL CELLS SEEN NO WBC SEEN NO ORGANISMS SEEN    Culture   Final    NO GROWTH 4 DAYS NO ANAEROBES ISOLATED; CULTURE IN PROGRESS FOR 5 DAYS Performed at [facility], [place] [address], [place], [place] [address]    Report Status PENDING  Incomplete  Body fluid culture w Gram Stain     Status: None (Preliminary result)   Collection Time: 05/16/21  9:36 AM   Specimen: Lung, Right Upper Lobe; Pleural Fluid  Result Value Ref Range Status   Specimen Description PLEURAL FLUID  Final  Special Requests LUNG RUL  Final   Gram Stain   Final    FEW WBC PRESENT,BOTH PMN AND MONONUCLEAR NO ORGANISMS SEEN    Culture   Final    NO GROWTH 1 DAY Performed at [facility], [address] [place], [place] [address]    Report Status PENDING  Incomplete     Time coordinating discharge: Over 30 minutes  SIGNED:   Phillips Climes, MD  [facility] 05/17/2021, 12:27 PM Pager   If 7PM-7AM, please contact night-coverage [email or web address] Password [email or web address]

## 2021-05-17 NOTE — TOC Transition Note (Signed)
Transition of Care (TOC) - CM/SW Discharge Note ? ? ?Patient Details  ?Name: Tonya Jenkins ?MRN: [id number] ?Date of Birth: 1945-09-26 ? ?Transition of Care (TOC) CM/SW Contact:  ?Cyndi Bender, RN ?Phone Number: ?05/17/2021, 1:05 PM ? ? ?Clinical Narrative:    ?Patient stable for discharge. ?Orders for Home Health PT/OT/RN. ?Offered choice. Patient deferred to [facility] to find a highly rated agency. Spoke to [place] with [facility] and referral accepted. ?Order for 3&1. Patient agreeable to use in house provider [facility]. ?Spoke to [place] with [facility] and 3&1 ordered to be delivered to patient's house. ?Address, Phone number and PCP verified.  ? ? ? ?Final next level of care: [place] ?Barriers to Discharge: Barriers Resolved ? ? ?Patient Goals and CMS Choice ?Patient states their goals for this hospitalization and ongoing recovery are:: return home ?CMS Medicare.gov Compare Post Acute Care list provided to:: Patient ?Choice offered to / list presented to : Patient ? ?Discharge Placement ?  ?           ? Home [facility] ?  ?  ?  ? ?Discharge Plan and Services ?  ?  ?           ? Home w/hh ?  ?  ?  ?  ?HH Arranged: RN, PT, OT ?[place] Agency: [place] ?Date HH Agency Contacted: 05/17/21 ?Time [place]: 8250 ?Representative spoke with at [place]: Tommi Rumps ? ?Social Determinants of Health (SDOH) Interventions ?  ? ? ?Readmission Risk Interventions ?Readmission Risk Prevention Plan 05/17/2021  ?Post Dischage Appt Complete  ?Medication Screening Complete  ?Transportation Screening Complete  ? ? ? ? ? ?

## 2021-05-17 NOTE — Care Management Important Message (Signed)
Important Message ? ?Patient Details  ?Name: Tonya Jenkins ?MRN: [id number] ?Date of Birth: November 26, 1945 ? ? ?Medicare Important Message Given:  Yes ? ? ? ? ?Tonya Jenkins ?05/17/2021, 4:20 PM ?

## 2021-05-17 NOTE — Telephone Encounter (Signed)
Called pt about genetics appt. No answer. Left msg with appt date and time and requested for pt to call back to confirm appt.  ?

## 2021-05-17 NOTE — Progress Notes (Signed)
Occupational Therapy Treatment ?Patient Details ?Name: Tonya Jenkins ?MRN: [id number] ?DOB: Jan 03, 1946 ?Today's Date: 05/17/2021 ? ? ?History of present illness 77 y/o female presented to ED on 05/11/21 for jaundice. Found to have significant elevated LFTs, concern for pancreatic malignancy. 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 PMH: HTN, OSA on CPAP ?  ?OT comments ? Patient progressing and showed improved ability to perform her LBD with setup only, with AE and was educated on where to purchase sock aid in community or online, compared to previous session. Pt ambulated with her own [facility] by her choice but reaching with LUE for increased support on furniture or IV pole and may benefit from a Rollator at discharge. Patient remains limited by ABD pain, generalized weakness and decreased activity tolerance along with deficits noted below. Pt continues to demonstrate good rehab potential and would benefit from continued skilled OT to increase safety and independence with ADLs and functional transfers to allow pt to return home safely and reduce caregiver burden and fall risk. ?  ? ?Recommendations for follow up therapy are one component of a multi-disciplinary discharge planning process, led by the attending physician.  Recommendations may be updated based on patient status, additional functional criteria and insurance authorization. ?   ?Follow Up Recommendations ? Home health OT  ?  ?Assistance Recommended at Discharge PRN  ?Patient can return home with the following ? Assistance with cooking/housework ?  ?Equipment Recommendations ? BSC/3in1;Other (comment) (Sock Aid; Rollator)  ?  ?Recommendations for Other Services   ? ?  ?Precautions / Restrictions Precautions ?Precautions: Fall ?Precaution Comments: drain on R side ?Restrictions ?Weight Bearing Restrictions: No  ? ? ?  ? ?Mobility Bed Mobility ?  ?Bed Mobility: Rolling, Sidelying to Sit, Sit to  Sidelying ?  ?Sidelying to sit: Supervision ?  ?  ?Sit to sidelying: Supervision ?  ?  ? ?Transfers ?Overall transfer level:  (see ADL) ?  ?  ?  ?  ?  ?  ?  ?  ?  ?  ?  ?Balance Overall balance assessment: Mild deficits observed, not formally tested ?  ?  ?  ?  ?  ?  ?  ?  ?  ?  ?  ?  ?  ?  ?  ?  ?  ?  ?   ? ?ADL either performed or assessed with clinical judgement  ? ?ADL Overall ADL's : Needs assistance/impaired ?  ?  ?  ?  ?  ?  ?  ?  ?  ?  ?Lower Body Dressing: Set up;Supervision/safety ?Lower Body Dressing Details (indicate cue type and reason): Pt trained in adaptive equipment for task ease with LE dressing: After demonstration/cues, pt used reacher to doff each sock with setup only. Pt donned socks with use of sock aid with setup/cues. Pt used reacher to don pillow case/pants simulation over feet and educated on energy conservation techniques. ?Toilet Transfer: Ambulation;Min guard;Minimal assistance;Regular Toilet ?Toilet Transfer Details (indicate cue type and reason): Pt declined RW. Stood from EOB with SPC in RT hand and Min HHA in LT. Pt then ambulated with cane and LT hand on IV pole with Min guard 12' to bathroom. Descent to toilet with [place]. Pt stood from toilet with Min guard and use of wall grab bar. ?Toileting- Clothing Manipulation and Hygiene: Supervision/safety;Sitting/lateral lean;Sit to/from stand ?Toileting - Clothing Manipulation Details (indicate cue type and reason): Pt performed anterior hygiene as well  as Probation officer with supervision. ?  ?  ?Functional mobility during ADLs: Min guard;Supervision/safety ?General ADL Comments: RT hand cane, LT hand either furniture cruising or on IV pole. ?  ? ?Extremity/Trunk Assessment Upper Extremity Assessment ?Upper Extremity Assessment: Generalized weakness ?  ?Lower Extremity Assessment ?Lower Extremity Assessment: Defer to PT evaluation ?  ?Cervical / Trunk Assessment ?Cervical / Trunk Assessment: Normal ?  ? ?Vision   ?Vision  Assessment?: No apparent visual deficits ?  ?Perception   ?  ?Praxis   ?  ? ?Cognition Arousal/Alertness: Awake/alert ?Behavior During Therapy: [facility] for tasks assessed/performed ?Overall Cognitive Status: Within Functional Limits for tasks assessed ?  ?  ?  ?  ?  ?  ?  ?  ?  ?  ?  ?  ?  ?  ?  ?  ?  ?  ?  ?   ?Exercises   ? ?  ?Shoulder Instructions   ? ? ?  ?General Comments    ? ? ?Pertinent Vitals/ Pain       Pain Assessment ?Pain Assessment: Faces ?Faces Pain Scale: Hurts a little bit ?Pain Location: R abdomen ? ?Home Living   ?  ?  ?  ?  ?  ?  ?  ?  ?  ?  ?  ?  ?  ?  ?  ?  ?  ?  ? ?  ?Prior Functioning/Environment    ?  ?  ?  ?   ? ?Frequency ? Min 2X/week  ? ? ? ? ?  ?Progress Toward Goals ? ?OT Goals(current goals can now be found in the care plan section) ? Progress towards OT goals: Progressing toward goals ? ?Acute Rehab OT Goals ?Patient Stated Goal: To go home, pt anticipaets today ?OT Goal Formulation: With patient ?Time For Goal Achievement: 05/29/21 ?Potential to Achieve Goals: Good  ?Plan Discharge plan remains appropriate   ? ?Co-evaluation ? ? ?   ?  ?  ?  ?  ? ?  ?AM-PAC OT "6 Clicks" Daily Activity     ?Outcome Measure ? ? Help from another person eating meals?: None ?Help from another person taking care of personal grooming?: None ?Help from another person toileting, which includes using toliet, bedpan, or urinal?: A Little ?Help from another person bathing (including washing, rinsing, drying)?: A Little ?Help from another person to put on and taking off regular upper body clothing?: A Little ?Help from another person to put on and taking off regular lower body clothing?: A Little ?6 Click Score: 20 ? ?  ?End of Session Equipment Utilized During Treatment: Gait belt;Other (comment) (Pt's own cane) ? ?OT Visit Diagnosis: Muscle weakness (generalized) (M62.81);Pain ?Pain - part of body:  (ABD) ?  ?Activity Tolerance Patient tolerated treatment well ?  ?Patient Left in bed;with call bell/phone within  reach;with bed alarm set ?  ?Nurse Communication Other (comment) (Pt on commode while OT grabbing AE.) ?  ? ?   ? ?Time: 6073-7106 ?OT Time Calculation (min): 32 min ? ?Charges: OT General Charges ?$OT Visit: 1 Visit ?OT Treatments ?$Self Care/Home Management : 8-22 mins ?$Therapeutic Activity: 8-22 mins ? ?Anderson [place], OT ?Acute Rehab Services ?Office: [phone] ?05/17/2021 ? ?Julien Girt ?05/17/2021, 11:37 AM ?

## 2021-05-17 NOTE — Progress Notes (Signed)
Patient being discharged home. Teaching provided on how to flush drain. 3&1 delivered to room. AVS reviewed and questions answered.  ?

## 2021-05-17 NOTE — Telephone Encounter (Signed)
Scheduled appt per 3/10 staff msg from Dr. Burr Medico. Pt is aware of appt date and time. Pt is aware to arrive 15 mins prior to appt time and to bring and updated insurance card. Pt is aware of appt location.   ?

## 2021-05-17 NOTE — Progress Notes (Addendum)
?  Obstructive jaundice secondary pancreatic mass ? ?Drain Location: RUQ ?Size: Fr size: 10 Fr ?Date of placement: 05/14/21  ?Currently to: Drain collection device: gravity ?24 hour output:  ?Output by Drain (mL) 05/15/21 0701 - 05/15/21 1900 05/15/21 1901 - 05/16/21 0700 05/16/21 0701 - 05/16/21 1900 05/16/21 1901 - 05/17/21 0700 05/17/21 0701 - 05/17/21 0920  ?Biliary Tube Cook slip-coat 10.2 Fr. RUQ 150 550 345 550   ? ? ?Interval imaging/drain manipulation:  ?none ? ?Current examination: ?Flushes/aspirates easily.  ?Insertion site unremarkable. ?Suture and stat lock in place. ?Dressed appropriately.  ? ?OP is bilious ?895 cc yesterday ?C/d/i ? ? ?Plan: ?Continue TID flushes with 5 cc NS. ?Record output Q shift. ?Dressing changes QD or PRN if soiled.  ?Call IR APP or on call IR MD if difficulty flushing or sudden change in drain output.  ?Repeat imaging/possible drain injection once output < 10 mL/QD (excluding flush material.) ? ?Discharge planning: ? ?Will need to flush drain once daily (please be sure she has Rx for flushes) ?Record OP daily ?She has appt with Dr Burr Medico Thursday 2pm ?Plan per Dr Burr Medico ? ?Please contact IR APP or on call IR MD prior to patient d/c to ensure appropriate follow up plans are in place. Typically patient will follow up with IR clinic 10-14 days post d/c for repeat imaging/possible drain injection. IR scheduler will contact patient with date/time of appointment. Patient will need to flush drain QD with 5 cc NS, record output QD, dressing changes every 2-3 days or earlier if soiled.  ? ?IR will continue to follow - please call with questions or concerns. ? ?  ?

## 2021-05-18 ENCOUNTER — Other Ambulatory Visit (HOSPITAL_COMMUNITY): Payer: Self-pay

## 2021-05-18 LAB — AEROBIC/ANAEROBIC CULTURE W GRAM STAIN (SURGICAL/DEEP WOUND)
Culture: NO GROWTH
Gram Stain: NONE SEEN

## 2021-05-18 LAB — ACID FAST SMEAR (AFB, MYCOBACTERIA): Acid Fast Smear: NEGATIVE

## 2021-05-18 NOTE — Progress Notes (Signed)
[facility] ?OFFICE PROGRESS NOTE ? ?[name], MD ?[facility] ?[place] [address] ? ?DIAGNOSIS: Pancreatic cancer  ? ?Oncology History  ?Pancreatic cancer [facility])  ?05/11/2021 Imaging  ? US Abdomen Complete  ? ?IMPRESSION: ?1. Dilatation of the common bile duct and pancreatic ducts. This can ?be seen in the setting of pancreatic head mass, although no focal ?pancreatic lesion is seen by ultrasound. Recommend further ?assessment with created protocol MRI, or contrast-enhanced ?abdominopelvic CT if patient cannot tolerate breath hold technique. ?2. Mild right hydronephrosis, of unknown etiology. ?3. Hepatic steatosis. ? ?  ?05/11/2021 Initial Diagnosis  ? Pancreatic Cancer ? ?  ?05/11/2021 Imaging  ? CT CHEST WO CONTRAST  ? ?IMPRESSION: ?No evidence of metastatic disease within the thorax. ?  ?Small right pleural effusion and right lower lobe compressive ?atelectasis. ?  ?Aortic Atherosclerosis (ICD10-I70.0). ? ?  ?05/12/2021 Imaging  ? CT ABDOMEN PELVIS W CONTRAST ? ?IMPRESSION: ?1. Marked severity intrahepatic and extrahepatic biliary dilatation ?with an abrupt narrowing of the common bile duct and pancreatic duct ?at the level of the pancreatic head. Further correlation with MRCP ?is recommended. ?2. Markedly distended gallbladder without evidence of cholelithiasis ?or acute cholecystitis. ?3. Colonic diverticulosis. ?4. Small bilateral simple renal cysts. ?5. 2.1 cm fat containing umbilical hernia. ?6. Aortic atherosclerosis. ?  ?Aortic Atherosclerosis (ICD10-I70.0). ? ?  ?05/12/2021 Imaging  ? MR ABDOMEN MRCP W WO CONTAST  ? ? ?IMPRESSION: ?1. 3.6 x 2.1 x 4.2 cm enhancing lesion in the head of the pancreas ?obstructing the pancreatic duct and common bile duct highly ?concerning for primary pancreatic neoplasm. Further evaluation with ?endoscopic ultrasound is strongly recommended at this time. ?  ?05/13/2021 Procedure  ? DG ERCP  ?IMPRESSION: ?Fluoroscopic imaging for ERCP. No discrete retrograde  biliary ?opacification ?  ?For complete description of intra procedural findings, please see ?performing service dictation. ? ? ?  ?05/14/2021 Pathology Results  ? CYTOLOGY - NON PAP  ?CASE: [id number]  ?PATIENT: Tonya Jenkins  ?Non-Gynecological Cytology Report  ? ?CYTOLOGY - NON PAP  ?CASE: [id number]  ?PATIENT: Tonya Jenkins  ?Non-Gynecological Cytology Report  ? ? ? ? ?Clinical History: None provided  ?Specimen Submitted:  A. PANCREATIC, HEAD MASS, FINE NEEDLE ASPIRATION:  ? ? ?FINAL MICROSCOPIC DIAGNOSIS:  ?- Malignant cells consistent with adenocarcinoma  ?  ?05/16/2021 Procedure  ? US THORACENTESIS ASP PLEURAL SPACE W/IMG GUIDE  ? ? ?IMPRESSION: ?Successful ultrasound guided right thoracentesis yielding 150 mL of ?pleural fluid. ?  ?No pneumothorax on post-procedure chest x-ray. ?  ?05/16/2021 Pathology Results  ? CYTOLOGY - NON PAP  ?CASE: [phone]  ?PATIENT: Tonya Jenkins  ? ?CYTOLOGY - NON PAP  ?CASE: [id number]  ?PATIENT: [name]  ?Non-Gynecological Cytology Report  ? ? ? ? ?Clinical History: Pleural effusion  ?Specimen Submitted:  A. PLEURAL FLUID, RIGHT, THORACENTESIS:  ? ? ?FINAL MICROSCOPIC DIAGNOSIS:  ?- Reactive mesothelial cells present  ?  ?05/20/2021 Initial Diagnosis  ? Pancreatic cancer ([place]) ?  ? ? ? ?PRIOR THERAPY: None ? ?CURRENT THERAPY: None ? ?INTERVAL HISTORY: ?Tonya Jenkins 76 y.o. female returns to the clinic for a hospital follow-up visit.  The patient was first seen during her most recent hospital admission from 05/12/2021 to 05/17/21. The patient presented to the hospital after seeing her dermatologist for the chief complaint of rash.  The patient's dermatologist noted that she was jaundiced and sent her to the emergency room for further evaluation.  She also had associated severe fatigue, abdominal discomfort, grey colored stools,  and diarrhea.  While admitted, the imaging findings were highly concerning for pancreatic cancer.  She underwent ERCP on 3/9 with unsuccessful  stent placement. She also had retroperitoneal perforation during the ERCP for which she underwent IV antibiotics with Cipro and Flagyl.  She also was prescribed with 10 days of antibiotics outpatient.  Therefore, she has a biliary drain by IR. She underwent EUS and FNA is consistent with pancreatic cancer.  Of note, the EUS performed on 05/14/2021 mentions the presence of a single biliary stent.  The chest CT did not show any evidence of lung metastases but a mild pleural effusion for which she underwent thoracentesis.  The cytology was negative for malignant cells.   ? ?The patient was seen by Dr. Burr Medico in the hospital.  Dr. Burr Medico discussed that the patient's case will be discussed at the multidisciplinary tumor board conference with the surgeons to see if this is resectable.  Dr. Burr Medico also discussed that given her age and the size of the tumor that the patient would likely require neoadjuvant chemotherapy.  The patient states she has an appointment with Dr. Zenia Resides next week.  The patient is not sure of the exact date. ? ?Since being discharged, the patient was evaluated by genetic counseling yesterday.  Her results are still pending.  She also had a repeat CMP yesterday which showed continued downtrending LFTs and bilirubin, although they are still elevated. ? ?Overall, the patient is feeling tired.  She noticed improvement in her jaundice.  She denies any fevers.  She is cold natured but denies any chills.  She thinks she lost weight since being in the hospital but denied any prior weight loss.  She denies any abdominal pain except last night she accidentally pulled the biliary drain and had associated biliary discomfort.  It is still draining approximately 100/h per patient report, but she is concerned because she states it was previously draining more than this.  There is some leakage around the site but the drain is still in place.  She is wondering if someone can look at it or try to flush it to ensure proper  placement.  ? ?She reports mild diarrhea approximately 2-3 times per day on days that she has diarrhea.  She has not tried taking any Imodium.  She denies any night sweats.  She is drinking a lot of water reportedly.  She denies any dysphagia or odynophagia.  She denies any back pain besides her chronic back pain. She has a history of herniated disc. Because of this, she has some chronic numbness in her right lower extremity. She denies any cough or chest pain.  ? ?The patient lives alone with her dog.  She has transportation concerns and will need social work involvement.  The patient is not interested in seeing a member the nutritionist team at this time.  She has particular dietary habits that she would like to follow.  The patient states she does not like taking medicine.  She is here today for evaluation and for more detailed discussion about her current condition and recommended treatment options. ? ? ? ? ?MEDICAL HISTORY: ?Past Medical History:  ?Diagnosis Date  ? History of hip surgery   ? Hx of neck surgery   ? Hypertension   ? ? ?ALLERGIES:  is allergic to amlodipine besylate, hydrochlorothiazide, lexapro [escitalopram oxalate], oxycodone, and penicillins. ? ?MEDICATIONS:  ?Current Outpatient Medications  ?Medication Sig Dispense Refill  ? B Complex Vitamins (VITAMIN B COMPLEX PO) Take 1 capsule by  mouth daily.    ? ciprofloxacin (CIPRO) 500 MG tablet Take 1 tablet (500 mg total) by mouth 2 (two) times daily for 7 days. 14 tablet 0  ? diltiazem (DILACOR XR) 180 MG 24 hr capsule Take 180 mg by mouth at bedtime.    ? hydrOXYzine (ATARAX) 25 MG tablet Take 25 mg by mouth every 8 (eight) hours as needed for anxiety or itching.    ? melatonin 3 MG TABS tablet Take 3 mg by mouth at bedtime.    ? metroNIDAZOLE (FLAGYL) 500 MG tablet Take 1 tablet (500 mg total) by mouth 3 (three) times daily for 7 days. 21 tablet 0  ? NON FORMULARY CPAP at bedtime    ? olmesartan (BENICAR) 40 MG tablet Take 40 mg by mouth at  bedtime.    ? traMADol (ULTRAM) 50 MG tablet Take 1 tablet (50 mg total) by mouth every 6 (six) hours as needed for severe pain. 15 tablet 0  ? triamcinolone lotion (KENALOG) 0.1 % Apply 0.1 application. topic

## 2021-05-19 ENCOUNTER — Inpatient Hospital Stay: Payer: Medicare Other

## 2021-05-19 ENCOUNTER — Other Ambulatory Visit: Payer: Self-pay | Admitting: Genetic Counselor

## 2021-05-19 ENCOUNTER — Other Ambulatory Visit: Payer: Self-pay

## 2021-05-19 ENCOUNTER — Inpatient Hospital Stay: Payer: Medicare Other | Attending: Physician Assistant | Admitting: Genetic Counselor

## 2021-05-19 DIAGNOSIS — C250 Malignant neoplasm of head of pancreas: Secondary | ICD-10-CM | POA: Diagnosis present

## 2021-05-19 DIAGNOSIS — R7989 Other specified abnormal findings of blood chemistry: Secondary | ICD-10-CM | POA: Diagnosis not present

## 2021-05-19 DIAGNOSIS — K831 Obstruction of bile duct: Secondary | ICD-10-CM | POA: Diagnosis not present

## 2021-05-19 DIAGNOSIS — N1330 Unspecified hydronephrosis: Secondary | ICD-10-CM | POA: Insufficient documentation

## 2021-05-19 DIAGNOSIS — C259 Malignant neoplasm of pancreas, unspecified: Secondary | ICD-10-CM

## 2021-05-19 DIAGNOSIS — J90 Pleural effusion, not elsewhere classified: Secondary | ICD-10-CM | POA: Insufficient documentation

## 2021-05-19 LAB — CMP (CANCER CENTER ONLY)
ALT: 224 U/L — ABNORMAL HIGH (ref 0–44)
AST: 80 U/L — ABNORMAL HIGH (ref 15–41)
Albumin: 3.8 g/dL (ref 3.5–5.0)
Alkaline Phosphatase: 196 U/L — ABNORMAL HIGH (ref 38–126)
Anion gap: 7 (ref 5–15)
BUN: 23 mg/dL (ref 8–23)
CO2: 29 mmol/L (ref 22–32)
Calcium: 9.1 mg/dL (ref 8.9–10.3)
Chloride: 99 mmol/L (ref 98–111)
Creatinine: 1.42 mg/dL — ABNORMAL HIGH (ref 0.44–1.00)
GFR, Estimated: 39 mL/min — ABNORMAL LOW (ref >60)
Glucose, Bld: 213 mg/dL — ABNORMAL HIGH (ref 70–99)
Potassium: 4.3 mmol/L (ref 3.5–5.1)
Sodium: 135 mmol/L (ref 135–145)
Total Bilirubin: 2.5 mg/dL — ABNORMAL HIGH (ref 0.3–1.2)
Total Protein: 7.1 g/dL (ref 6.5–8.1)

## 2021-05-19 LAB — BODY FLUID CULTURE W GRAM STAIN: Culture: NO GROWTH

## 2021-05-19 LAB — GENETIC SCREENING ORDER

## 2021-05-19 NOTE — Progress Notes (Signed)
I faxed Consult note, demographics, insurance information and referral to [facility] for consult with Dr Zenia Resides. ?

## 2021-05-19 NOTE — Progress Notes (Signed)
The proposed treatment discussed in conference is for discussion purpose only and is not a binding recommendation.  The patients have not been physically examined, or presented with their treatment options.  Therefore, final treatment plans cannot be decided.  

## 2021-05-19 NOTE — Progress Notes (Signed)
REFERRING PROVIDER: ?Truitt Merle, MD ?[address] ?[place],  [place] [address] ? ?PRIMARY PROVIDER:  ?[name], MD ? ?PRIMARY REASON FOR VISIT:  ?Encounter Diagnoses  ?Name Primary?  ? Malignant neoplasm of pancreas, unspecified location of malignancy ([place]) Yes  ? Family history of prostate cancer   ? ? ? ?HISTORY OF PRESENT ILLNESS:   ?Tonya Jenkins, a 76 y.o. female, was seen for a [facility] cancer genetics consultation at the request of Dr. Burr Medico due to a personal history of cancer.  Tonya Jenkins presents to clinic today to discuss the possibility of a hereditary predisposition to cancer, to discuss genetic testing, and to further clarify her future cancer risks, as well as potential cancer risks for family members.  ? ?In March 2023, at the age of 39, Tonya Jenkins had a fine needle aspiration of a pancreatic mass, which showed malignant cells consistent with adenocarcinoma.  Imaging appears to be consistent with pancreatic primary.  She reports a history of borderline diabetes and no history of pancreatitis.   ? ? ?Past Medical History:  ?Diagnosis Date  ? History of hip surgery   ? Hx of neck surgery   ? Hypertension   ? ? ?Past Surgical History:  ?Procedure Laterality Date  ? CERVICAL SPINE SURGERY    ? ERCP N/A 05/13/2021  ? Procedure: ENDOSCOPIC RETROGRADE CHOLANGIOPANCREATOGRAPHY (ERCP);  Surgeon: Carol Ada, MD;  Location: [facility];  Service: Gastroenterology;  Laterality: N/A;  ? ESOPHAGOGASTRODUODENOSCOPY (EGD) WITH PROPOFOL N/A 05/14/2021  ? Procedure: ESOPHAGOGASTRODUODENOSCOPY (EGD) WITH PROPOFOL;  Surgeon: Carol Ada, MD;  Location: [facility];  Service: Gastroenterology;  Laterality: N/A;  ? EUS Left 05/14/2021  ? Procedure: UPPER ENDOSCOPIC ULTRASOUND (EUS) LINEAR;  Surgeon: Carol Ada, MD;  Location: [facility];  Service: Gastroenterology;  Laterality: Left;  ? FINE NEEDLE ASPIRATION  05/14/2021  ? Procedure: FINE NEEDLE ASPIRATION (FNA) LINEAR;  Surgeon: Carol Ada, MD;   Location: [facility];  Service: Gastroenterology;;  ? HAMMER TOE SURGERY    ? HIP SURGERY    ? IR INT EXT BILIARY DRAIN WITH CHOLANGIOGRAM  05/13/2021  ? KNEE SURGERY    ? ? ?FAMILY HISTORY:  ?We obtained a detailed, 4-generation family history.  Significant diagnoses are listed below: ?Family History  ?Problem Relation Age of Onset  ? Prostate cancer Brother   ?     mets; d. 2s  ? ? ? ?[name] is unaware of previous family history of genetic testing for hereditary cancer risks. There is no reported Ashkenazi Jewish ancestry. There is no known consanguinity. ? ?GENETIC COUNSELING ASSESSMENT: Tonya Jenkins is a 76 y.o. female with a personal history of cancer which is somewhat suggestive of a hereditary cancer syndrome and predisposition to cancer given her findings consistent with pancreatic cancer. We, therefore, discussed and recommended the following at today's visit.  ? ?DISCUSSION: We discussed that 5 - 10% of cancer is hereditary.  Most cases of hereditary pancreatic cancer associated with mutations in BRCA1/2.  There are other genes that can be associated with hereditary pancreatic cancer syndromes.  We discussed that testing is beneficial for several reasons including knowing how to follow individuals for their cancer risks, identifying whether potential treatment options, such as PARP inhibitors, would be beneficial, and understanding if other family members could be at risk for cancer and allowing them to undergo genetic testing.  ? ?We reviewed the characteristics, features and inheritance patterns of hereditary cancer syndromes. We also discussed genetic testing, including the appropriate family members to  test, the process of testing, insurance coverage and turn-around-time for results. We discussed the implications of a negative, positive, carrier and/or variant of uncertain significant result. We recommended Tonya Jenkins pursue genetic testing for a panel that includes genes associated with pancreatic  and other cancers.  ? ?The CustomNext-Cancer +RNAinsight Panel offered by [facility] and includes sequencing and rearrangement analysis for the following 91 genes: AIP, ALK, APC, ATM, AXIN2, BAP1, BARD1, BLM, BMPR1A, BRCA1, BRCA2, BRIP1, CDC73, CDH1, CDK4, CDKN1B, CDKN2A, CHEK2, CTNNA1, DICER1, FANCC, FH, FLCN, GALNT12, KIF1B, LZTR1, MAX, MEN1, MET, MLH1, MRE11A, MSH2, MSH3, MSH6, MUTYH, NBN, NF1, NF2, NTHL1, PALB2, PHOX2B, PMS2, POT1, PRKAR1A, PTCH1, PTEN, RAD50, RAD51C, RAD51D, RB1, RECQL, RET, SDHA, SDHAF2, SDHB, SDHC, SDHD, SMAD4, SMARCA4, SMARCB1, SMARCE1, STK11, SUFU, TMEM127, TP53, TSC1, TSC2, VHL and XRCC2 (sequencing and deletion/duplication); CASR, CFTR, CPA1, CTRC, EGFR, EGLN1, FAM175A, HOXB13, KIT, MITF, MLH3, PALLD, PDGFRA, POLD1, POLE, PRSS1, RINT1, RPS20, SPINK1 and TERT (sequencing only); EPCAM and GREM1 (deletion/duplication only). RNA data is routinely analyzed for use in variant interpretation for all genes. ? ?Based on Tonya Jenkins's personal history of cancer, she meets medical criteria for genetic testing. Despite that she meets criteria, she may still have an out of pocket cost.  ? ?PLAN: After considering the risks, benefits, and limitations, Tonya Jenkins provided informed consent to pursue genetic testing and the blood sample was sent to [facility] for analysis of the CustomNext-Cancer +RNAinsight Panel. Results should be available within approximately 3 weeks' time, at which point they will be disclosed by telephone to Tonya Jenkins, as will any additional recommendations warranted by these results. Tonya Jenkins will receive a summary of her genetic counseling visit and a copy of her results once available. This information will also be available in Epic.  ? ?Lastly, we encouraged Tonya Jenkins to remain in contact with cancer genetics annually so that we can continuously update the family history and inform her of any changes in cancer genetics and testing that may be of benefit for this  family.  ? ?Tonya Jenkins's questions were answered to her satisfaction today. Our contact information was provided should additional questions or concerns arise. Thank you for the referral and allowing [place] to share in the care of your patient.  ? ?Paulyne Mooty M. Joette Catching, [place], [place] ?Genetic Counselor ?[email or web address] ?(P) [phone] ? ?The patient was seen for a total of 30 minutes in face-to-face genetic counseling.  The patient was accompanied by her friend, Mel Almond. Drs. Lindi Adie and/or Burr Medico were available to discuss this case as needed.  ? ? ?_______________________________________________________________________ ?For Office Staff:  ?Number of people involved in session: 2 ?Was an Intern/ student involved with case: no ? ?

## 2021-05-20 ENCOUNTER — Ambulatory Visit (HOSPITAL_COMMUNITY)
Admission: RE | Admit: 2021-05-20 | Discharge: 2021-05-20 | Disposition: A | Discharge status: Home / Self Care | Payer: Medicare Other | Source: Ambulatory Visit | Attending: Radiology | Admitting: Radiology

## 2021-05-20 ENCOUNTER — Encounter: Payer: Self-pay | Admitting: Genetic Counselor

## 2021-05-20 ENCOUNTER — Other Ambulatory Visit (HOSPITAL_COMMUNITY): Payer: Self-pay | Admitting: Radiology

## 2021-05-20 ENCOUNTER — Inpatient Hospital Stay (HOSPITAL_BASED_OUTPATIENT_CLINIC_OR_DEPARTMENT_OTHER): Payer: Medicare Other | Admitting: Physician Assistant

## 2021-05-20 ENCOUNTER — Other Ambulatory Visit (HOSPITAL_COMMUNITY): Payer: Self-pay | Admitting: Student

## 2021-05-20 VITALS — BP 133/79 | HR 80 | Temp 98.2°F | Resp 18 | Wt 145.1 lb

## 2021-05-20 DIAGNOSIS — R110 Nausea: Secondary | ICD-10-CM

## 2021-05-20 DIAGNOSIS — C250 Malignant neoplasm of head of pancreas: Secondary | ICD-10-CM | POA: Diagnosis not present

## 2021-05-20 DIAGNOSIS — C259 Malignant neoplasm of pancreas, unspecified: Secondary | ICD-10-CM | POA: Insufficient documentation

## 2021-05-20 HISTORY — PX: IR PATIENT EVAL TECH 0-60 MINS: IMG5564

## 2021-05-20 MED ORDER — ONDANSETRON HCL 8 MG PO TABS: 8.0000 mg | ORAL_TABLET | Freq: Three times a day (TID) | ORAL | 2 refills | Status: DC | PRN

## 2021-05-20 NOTE — Progress Notes (Signed)
Asked to eval recently place I/E biliary drain for obstructive jaundice. ?Was at [facility] appt today and mentioned that she inadvertantly tugged on bag and was concerned that tube pulled back some. ?She can't tell as drain is under dressing. ?She does report consistent bilious output though. ? ?Dressing removed. ?Drain intact, site clean. Suture and stat lock intact. ?Does not appear to be retracted at all. ?Drain flushed easily. Return of thin yellow bile. ?New dressing applied. ?Pt reassured. ? ?Tonya Dike PA-C ?Interventional Radiology ?05/20/2021 ?4:40 PM ? ?

## 2021-05-20 NOTE — Procedures (Addendum)
Assisted PA in assessment and new dressing application to I/E biliary drain.  ?

## 2021-05-20 NOTE — Patient Instructions (Addendum)
-  It was nice meeting you today.  I know we covered a lot of important information.  Here is a summary of the main points ?-When they did the procedure in the hospital, they took a sample of the spot in the pancreas.  The pathology was consistent with pancreatic cancer. ?-Dr. Burr Medico would like you to see Dr. Zenia Resides next week to talk about surgery.  The surgical procedure is called a Whipple procedure.  It does not appear that this has spread outside of the pancreas that we can visualize.  Of course, the spot in the pancreas is blocking the duct so your bilirubin cannot drain properly into your small intestines.  Therefore, it is backing up into your gallbladder, liver, and blood which is what caused the jaundice.  ?-We will continue to monitor your liver labs and bilirubin closely to ensure that this continues to trend downwards. ?-Surgery will to discuss about proceeding with surgery first and chemotherapy afterwards or doing chemotherapy first and surgery afterwards. We will see what they recommend.  ?-Dr. Burr Medico would recommend chemotherapy with Abraxane and gemcitabine in your situation.  This treatment is two chemotherapy drugs.  There is a 3 chemotherapy drug regimen, however, Dr. Burr Medico feels that this would be too intensive in your situation.  People often will require 3 to 6 months of chemotherapy upfront prior to surgery.  Some patients may split it up with a few months of chemo before surgery and some chemotherapy after surgery. ?-We will discuss the chemotherapy If we proceed with chemotherapy first, we will discuss medications that need to be sent to your pharmacy, arranging for a chemo education class, and repeat labs at your appointment next week, with the intent of starting your treatment in approximately 2 3 weeks from now.  If we are going to proceed with chemotherapy prior to surgery, Dr. Zenia Resides can talk to you next week about putting a Port-A-Cath and prior to starting any treatment.  This way they can  give you treatment through the Port-A-Cath as well as do lab work through it. ? ? ?

## 2021-05-20 NOTE — Progress Notes (Signed)
I met with Tonya Jenkins  after her consultation with Cassie Heilingoetter, PA-C and Dr Burr Medico.  I explained my role as a nurse navigator and provided my contact information. ?All questions were answered.  She verbalized understanding. ? ?

## 2021-05-21 ENCOUNTER — Encounter (HOSPITAL_COMMUNITY): Payer: Self-pay | Admitting: Radiology

## 2021-05-21 ENCOUNTER — Telehealth: Payer: Self-pay | Admitting: *Deleted

## 2021-05-21 NOTE — Telephone Encounter (Signed)
Pt called stating drain dressing had saturated all the way to her gown. Made another attempt after leaving vm. Spoke with pt and gave message below. Pt verbalized understanding.  ?

## 2021-05-21 NOTE — Progress Notes (Signed)
Tonya Jenkins left a vm stating that she is having drainage around her external biliary drain.  She states she does not know what to do.  I returned her call and left a vm.  I reassured her that is not uncommon to have this occur.  I instructed her to flush the drain to see if there is resistance.  I told her to change the dressing as needed and I provided the drain clinic phone number, [phone], as well as my direct number. ?

## 2021-05-21 NOTE — Telephone Encounter (Signed)
-----   Message from Royston Bake, RN sent at 05/21/2021  3:34 PM EDT ----- ?She needs to call the drain clinic at [facility] [phone] ? ?kb ?----- Message ----- ?From: Merril Abbe, LPN ?Sent: 05/21/2021   3:23 PM EDT ?To: Ascencion Dike, PA-C, Janine Limbo, RT, # ? ?Pt called left vm stating that she woke up and dressing to drain was saturated. She did not know who to call. I called pt back, there was no answer, but vm was left on pt personal cell ? ? ?

## 2021-05-24 ENCOUNTER — Telehealth: Payer: Self-pay | Admitting: Hematology

## 2021-05-24 NOTE — Telephone Encounter (Signed)
Sch per 3/17 inbasket ?

## 2021-05-25 NOTE — Progress Notes (Signed)
Tonya doffing left a vm yesterday requesting Friday, 05/28/2021 appt be changed to a phone visit.  Reviewed with Dr Burr Medico.  Appt changed.  I left Tonya Jenkins a vm letting her know this appt will now be by phone. ?

## 2021-05-26 ENCOUNTER — Telehealth: Payer: Self-pay

## 2021-05-26 NOTE — Telephone Encounter (Signed)
Pt called to inform Dr. Burr Medico that she is transferring her care to [facility] d/t her family lives near [place] and she will need their help in getting back & forth to her appts.  Pt stated it will work out better for all parties involved and just wanted to make Dr. Ernestina Penna office aware of her decision. ?

## 2021-05-27 ENCOUNTER — Encounter: Payer: Self-pay | Admitting: Licensed Clinical Social Worker

## 2021-05-27 NOTE — Progress Notes (Signed)
[facility] Clinical Social Work  ?Initial Assessment ? ? ?Tonya Jenkins is a 76 y.o. year old female contacted by phone. Clinical Social Work was referred by medical provider for assessment of psychosocial needs.  ? ?SDOH (Social Determinants of Health) assessments performed: Yes ?  ?Distress Screen completed: No ?   ? View : No data to display.  ?  ?  ?  ? ? ? ? ?Family/Social Information:  ?Housing Arrangement: patient lives alone ?Family members/support persons in your life? At present pt does not have a strong support system.  Pt moved to [place] 2 years ago and does not feel she has established a strong network of friends yet. ?Transportation concerns: yes  ?Employment: Retired. Income source: [employer] ?Financial concerns:  At present pt does not have financial concerns, but may need assistance as treatment progresses. ?Type of concern: Transportation ?Food access concerns: no ?Religious or spiritual practice: none identified ?Services Currently in place:  Pt is currently receiving home care through [facility]. ? ?Coping/ Adjustment to diagnosis: ?Patient understands treatment plan and what happens next? yes ?Concerns about diagnosis and/or treatment:  Pt is concerned about lack of social support as well as transportation to get to and from appts ?Patient reported stressors: Transportation ?Hopes and priorities: Pt's priority is to begin treatment w/ the hope of curing the cancer.  Pt lost a step daughter 25 years ago to pancreatic cancer. ?Patient enjoys  Pt resides w/ her dog whom she enjoys spending time with. ?Current coping skills/ strengths: Ability for insight  ? ? ? SUMMARY: ?Current SDOH Barriers:  ?Transportation ? ?Clinical Social Work Clinical Goal(s):  ?patient will work with SW to address concerns related to transportation ? ?Interventions: ?Discussed common feeling and emotions when being diagnosed with cancer, and the importance of support during treatment ?Informed patient of the  support team roles and support services at [facility] ?Provided CSW contact information and encouraged patient to call with any questions or concerns ?Provided patient with information about [facility] transport, as well as [facility] transport.  Both the application for [facility] transport as well as contact information for [facility] were emailed to pt at her request.   ? ? ?Follow Up Plan: Patient will contact CSW with any support or resource needs ?Patient verbalizes understanding of plan: Yes ? ? ? ?Henriette Combs, LCSW ?

## 2021-05-27 NOTE — Progress Notes (Signed)
I spoke with Tonya Jenkins.  She chose Dr Shon Hough at [facility] for her surgical consult.  She has not decided where she wants her medical oncology care.  I told her I would keep in touch with her until she makes that decision.  All questions were answered.  She verbalized understanding. ? ?

## 2021-05-28 ENCOUNTER — Telehealth: Payer: Medicare Other | Admitting: Hematology

## 2021-05-31 ENCOUNTER — Encounter: Payer: Self-pay | Admitting: Genetic Counselor

## 2021-05-31 ENCOUNTER — Telehealth: Payer: Self-pay | Admitting: Genetic Counselor

## 2021-05-31 ENCOUNTER — Ambulatory Visit: Payer: Self-pay | Admitting: Genetic Counselor

## 2021-05-31 DIAGNOSIS — Z1379 Encounter for other screening for genetic and chromosomal anomalies: Secondary | ICD-10-CM

## 2021-05-31 DIAGNOSIS — C250 Malignant neoplasm of head of pancreas: Secondary | ICD-10-CM

## 2021-05-31 NOTE — Progress Notes (Signed)
HPI:   ?Tonya Jenkins was previously seen in the [facility] clinic due to a personal history of pancreatic cancer and concerns regarding a hereditary predisposition to cancer. Please refer to our prior cancer genetics clinic note for more information regarding our discussion, assessment and recommendations, at the time. Tonya Jenkins recent genetic test results were disclosed to her, as were recommendations warranted by these results. These results and recommendations are discussed in more detail below. ? ?CANCER HISTORY:  ?Oncology History  ?Pancreatic cancer [facility])  ?05/11/2021 Imaging  ? US Abdomen Complete  ? ?IMPRESSION: ?1. Dilatation of the common bile duct and pancreatic ducts. This can ?be seen in the setting of pancreatic head mass, although no focal ?pancreatic lesion is seen by ultrasound. Recommend further ?assessment with created protocol MRI, or contrast-enhanced ?abdominopelvic CT if patient cannot tolerate breath hold technique. ?2. Mild right hydronephrosis, of unknown etiology. ?3. Hepatic steatosis. ? ?  ?05/11/2021 Initial Diagnosis  ? Pancreatic Cancer ? ?  ?05/11/2021 Imaging  ? CT CHEST WO CONTRAST  ? ?IMPRESSION: ?No evidence of metastatic disease within the thorax. ?  ?Small right pleural effusion and right lower lobe compressive ?atelectasis. ?  ?Aortic Atherosclerosis (ICD10-I70.0). ? ?  ?05/12/2021 Imaging  ? CT ABDOMEN PELVIS W CONTRAST ? ?IMPRESSION: ?1. Marked severity intrahepatic and extrahepatic biliary dilatation ?with an abrupt narrowing of the common bile duct and pancreatic duct ?at the level of the pancreatic head. Further correlation with MRCP ?is recommended. ?2. Markedly distended gallbladder without evidence of cholelithiasis ?or acute cholecystitis. ?3. Colonic diverticulosis. ?4. Small bilateral simple renal cysts. ?5. 2.1 cm fat containing umbilical hernia. ?6. Aortic atherosclerosis. ?  ?Aortic Atherosclerosis (ICD10-I70.0). ? ?  ?05/12/2021 Imaging  ? MR ABDOMEN  MRCP W WO CONTAST  ? ? ?IMPRESSION: ?1. 3.6 x 2.1 x 4.2 cm enhancing lesion in the head of the pancreas ?obstructing the pancreatic duct and common bile duct highly ?concerning for primary pancreatic neoplasm. Further evaluation with ?endoscopic ultrasound is strongly recommended at this time. ?  ?05/13/2021 Procedure  ? DG ERCP  ?IMPRESSION: ?Fluoroscopic imaging for ERCP. No discrete retrograde biliary ?opacification ?  ?For complete description of intra procedural findings, please see ?performing service dictation. ? ? ?  ?05/14/2021 Pathology Results  ? CYTOLOGY - NON PAP  ?CASE: [id number]  ?PATIENT: Tonya Jenkins  ?Non-Gynecological Cytology Report  ? ?CYTOLOGY - NON PAP  ?CASE: [id number]  ?PATIENT: Tonya Jenkins  ?Non-Gynecological Cytology Report  ? ? ? ? ?Clinical History: None provided  ?Specimen Submitted:  A. PANCREATIC, HEAD MASS, FINE NEEDLE ASPIRATION:  ? ? ?FINAL MICROSCOPIC DIAGNOSIS:  ?- Malignant cells consistent with adenocarcinoma  ?  ?05/16/2021 Procedure  ? US THORACENTESIS ASP PLEURAL SPACE W/IMG GUIDE  ? ? ?IMPRESSION: ?Successful ultrasound guided right thoracentesis yielding 150 mL of ?pleural fluid. ?  ?No pneumothorax on post-procedure chest x-ray. ?  ?05/16/2021 Pathology Results  ? CYTOLOGY - NON PAP  ?CASE: [id number]  ?PATIENT: Tonya Jenkins  ? ?CYTOLOGY - NON PAP  ?CASE: [id number]  ?PATIENT: Tonya Jenkins  ?Non-Gynecological Cytology Report  ? ? ? ? ?Clinical History: Pleural effusion  ?Specimen Submitted:  A. PLEURAL FLUID, RIGHT, THORACENTESIS:  ? ? ?FINAL MICROSCOPIC DIAGNOSIS:  ?- Reactive mesothelial cells present  ?  ?05/20/2021 Initial Diagnosis  ? Pancreatic cancer [facility]) ?  ?05/29/2021 Genetic Testing  ? Negative hereditary cancer genetic testing: no pathogenic variants detected in [facility] CustomNext-Cancer +RNAinsight Panel.  Report date is May 29, 2021.  ? ?The CustomNext-Cancer +RNAinsight  Panel offered by [employer] and includes sequencing and rearrangement  analysis for the following 91 genes: AIP, ALK, APC, ATM, AXIN2, BAP1, BARD1, BLM, BMPR1A, BRCA1, BRCA2, BRIP1, CDC73, CDH1, CDK4, CDKN1B, CDKN2A, CHEK2, CTNNA1, DICER1, FANCC, FH, FLCN, GALNT12, KIF1B, LZTR1, MAX, MEN1, MET, MLH1, MRE11A, MSH2, MSH3, MSH6, MUTYH, NBN, NF1, NF2, NTHL1, PALB2, PHOX2B, PMS2, POT1, PRKAR1A, PTCH1, PTEN, RAD50, RAD51C, RAD51D, RB1, RECQL, RET, SDHA, SDHAF2, SDHB, SDHC, SDHD, SMAD4, SMARCA4, SMARCB1, SMARCE1, STK11, SUFU, TMEM127, TP53, TSC1, TSC2, VHL and XRCC2 (sequencing and deletion/duplication); CASR, CFTR, CPA1, CTRC, EGFR, EGLN1, FAM175A, HOXB13, KIT, MITF, MLH3, PALLD, PDGFRA, POLD1, POLE, PRSS1, RINT1, RPS20, SPINK1 and TERT (sequencing only); EPCAM and GREM1 (deletion/duplication only). RNA data is routinely analyzed for use in variant interpretation for all genes. ?  ? ? ?FAMILY HISTORY:  ?We obtained a detailed, 4-generation family history.  Significant diagnoses are listed below: ?Family History  ?Problem Relation Age of Onset  ? Prostate cancer Brother   ?     mets; d. 83s  ? ? ?  ?Tonya Jenkins is unaware of previous family history of genetic testing for hereditary cancer risks. There is no reported Ashkenazi Jewish ancestry. There is no known consanguinity. ? ?GENETIC TEST RESULTS:  ?The [facility] CustomNext-Cancer +RNAinsight Panel found no pathogenic mutations. The CustomNext-Cancer +RNAinsight Panel offered by [facility] and includes sequencing and rearrangement analysis for the following 91 genes: AIP, ALK, APC, ATM, AXIN2, BAP1, BARD1, BLM, BMPR1A, BRCA1, BRCA2, BRIP1, CDC73, CDH1, CDK4, CDKN1B, CDKN2A, CHEK2, CTNNA1, DICER1, FANCC, FH, FLCN, GALNT12, KIF1B, LZTR1, MAX, MEN1, MET, MLH1, MRE11A, MSH2, MSH3, MSH6, MUTYH, NBN, NF1, NF2, NTHL1, PALB2, PHOX2B, PMS2, POT1, PRKAR1A, PTCH1, PTEN, RAD50, RAD51C, RAD51D, RB1, RECQL, RET, SDHA, SDHAF2, SDHB, SDHC, SDHD, SMAD4, SMARCA4, SMARCB1, SMARCE1, STK11, SUFU, TMEM127, TP53, TSC1, TSC2, VHL and XRCC2 (sequencing and  deletion/duplication); CASR, CFTR, CPA1, CTRC, EGFR, EGLN1, FAM175A, HOXB13, KIT, MITF, MLH3, PALLD, PDGFRA, POLD1, POLE, PRSS1, RINT1, RPS20, SPINK1 and TERT (sequencing only); EPCAM and GREM1 (deletion/duplication only). RNA data is routinely analyzed for use in variant interpretation for all genes. ? ?The test report has been scanned into EPIC and is located under the Molecular Pathology section of the Results Review tab.  A portion of the result report is included below for reference. Genetic testing reported out on May 29, 2021.  ? ? ? ?Even though a pathogenic variant was not identified, possible explanations for the cancer in the family may include: ?There may be no hereditary risk for cancer in the family. The cancers in Tonya Jenkins and/or her family may be sporadic/familial or due to other genetic and environmental factors. ?There may be a gene mutation in one of these genes that current testing methods cannot detect but that chance is small. ?There could be another gene that has not yet been discovered, or that we have not yet tested, that is responsible for the cancer diagnoses in the family.  ?It is also possible there is a hereditary cause for the cancer in the family that Tonya Jenkins did not inherit. ? ? ?Therefore, it is important to remain in touch with cancer genetics in the future so that we can continue to offer Tonya Jenkins the most up to date genetic testing.  ? ?ADDITIONAL GENETIC TESTING:  ?We discussed with Tonya Jenkins that her genetic testing was fairly extensive.  If there are genes identified to increase cancer risk that can be analyzed in the future, we would be happy to discuss and coordinate this testing at that time.   ? ?  CANCER SCREENING RECOMMENDATIONS:  ?Tonya Jenkins's test result is considered negative (normal).  This means that we have not identified a hereditary cause for her personal history of pancreatic cancer at this time.  ? ?An individual's cancer risk and medical management are  not determined by genetic test results alone. Overall cancer risk assessment incorporates additional factors, including personal medical history, family history, and any available genetic information that may result i

## 2021-05-31 NOTE — Telephone Encounter (Signed)
Revealed negative genetic testing.  Discussed that we do not know why she has pancreatic cancer or why there is cancer in the family. It could be sporadic/famillial, due to a change in a gene that she did not inherit, due to a different gene that we are not testing, or maybe our current technology may not be able to pick something up.  It will be important for her to keep in contact with genetics to keep up with whether additional testing may be needed.   ? ? ?

## 2021-06-17 ENCOUNTER — Other Ambulatory Visit: Payer: Medicare Other

## 2021-06-21 ENCOUNTER — Emergency Department (HOSPITAL_COMMUNITY): Payer: Medicare Other

## 2021-06-21 ENCOUNTER — Encounter (HOSPITAL_COMMUNITY): Payer: Self-pay

## 2021-06-21 ENCOUNTER — Other Ambulatory Visit: Payer: Self-pay

## 2021-06-21 ENCOUNTER — Emergency Department (HOSPITAL_COMMUNITY)
Admission: EM | Admit: 2021-06-21 | Discharge: 2021-06-21 | Disposition: A | Discharge status: Other Acute Inpatient Hospital | Payer: Medicare Other | Attending: Emergency Medicine | Admitting: Emergency Medicine

## 2021-06-21 DIAGNOSIS — Z79899 Other long term (current) drug therapy: Secondary | ICD-10-CM | POA: Insufficient documentation

## 2021-06-21 DIAGNOSIS — C249 Malignant neoplasm of biliary tract, unspecified: Secondary | ICD-10-CM | POA: Diagnosis not present

## 2021-06-21 DIAGNOSIS — R079 Chest pain, unspecified: Secondary | ICD-10-CM | POA: Diagnosis not present

## 2021-06-21 DIAGNOSIS — K8020 Calculus of gallbladder without cholecystitis without obstruction: Secondary | ICD-10-CM | POA: Diagnosis not present

## 2021-06-21 DIAGNOSIS — R109 Unspecified abdominal pain: Secondary | ICD-10-CM | POA: Diagnosis present

## 2021-06-21 DIAGNOSIS — C801 Malignant (primary) neoplasm, unspecified: Secondary | ICD-10-CM

## 2021-06-21 LAB — URINALYSIS, ROUTINE W REFLEX MICROSCOPIC
Bacteria, UA: NONE SEEN
Bilirubin Urine: NEGATIVE
Glucose, UA: 50 mg/dL — AB
Ketones, ur: NEGATIVE mg/dL
Leukocytes,Ua: NEGATIVE
Nitrite: NEGATIVE
Protein, ur: NEGATIVE mg/dL
Specific Gravity, Urine: 1.028 (ref 1.005–1.030)
pH: 6 (ref 5.0–8.0)

## 2021-06-21 LAB — CBC WITH DIFFERENTIAL/PLATELET
Abs Immature Granulocytes: 0.08 K/uL — ABNORMAL HIGH (ref 0.00–0.07)
Basophils Absolute: 0.1 K/uL (ref 0.0–0.1)
Basophils Relative: 1 %
Eosinophils Absolute: 0.1 K/uL (ref 0.0–0.5)
Eosinophils Relative: 1 %
HCT: 32.6 % — ABNORMAL LOW (ref 36.0–46.0)
Hemoglobin: 10.7 g/dL — ABNORMAL LOW (ref 12.0–15.0)
Immature Granulocytes: 1 %
Lymphocytes Relative: 6 %
Lymphs Abs: 0.7 K/uL (ref 0.7–4.0)
MCH: 28.2 pg (ref 26.0–34.0)
MCHC: 32.8 g/dL (ref 30.0–36.0)
MCV: 86 fL (ref 80.0–100.0)
Monocytes Absolute: 1.1 K/uL — ABNORMAL HIGH (ref 0.1–1.0)
Monocytes Relative: 10 %
Neutro Abs: 8.8 K/uL — ABNORMAL HIGH (ref 1.7–7.7)
Neutrophils Relative %: 81 %
Platelets: 402 K/uL — ABNORMAL HIGH (ref 150–400)
RBC: 3.79 MIL/uL — ABNORMAL LOW (ref 3.87–5.11)
RDW: 17.2 % — ABNORMAL HIGH (ref 11.5–15.5)
WBC: 10.8 K/uL — ABNORMAL HIGH (ref 4.0–10.5)
nRBC: 0 % (ref 0.0–0.2)

## 2021-06-21 LAB — COMPREHENSIVE METABOLIC PANEL
ALT: 254 U/L — ABNORMAL HIGH (ref 0–44)
AST: 295 U/L — ABNORMAL HIGH (ref 15–41)
Albumin: 3.5 g/dL (ref 3.5–5.0)
Alkaline Phosphatase: 442 U/L — ABNORMAL HIGH (ref 38–126)
Anion gap: 10 (ref 5–15)
BUN: 15 mg/dL (ref 8–23)
CO2: 24 mmol/L (ref 22–32)
Calcium: 9.1 mg/dL (ref 8.9–10.3)
Chloride: 101 mmol/L (ref 98–111)
Creatinine, Ser: 0.9 mg/dL (ref 0.44–1.00)
GFR, Estimated: >60 mL/min (ref >60)
Glucose, Bld: 199 mg/dL — ABNORMAL HIGH (ref 70–99)
Potassium: 4.6 mmol/L (ref 3.5–5.1)
Sodium: 135 mmol/L (ref 135–145)
Total Bilirubin: 2.1 mg/dL — ABNORMAL HIGH (ref 0.3–1.2)
Total Protein: 6.6 g/dL (ref 6.5–8.1)

## 2021-06-21 LAB — PROTIME-INR
INR: 1 (ref 0.8–1.2)
Prothrombin Time: 12.7 seconds (ref 11.4–15.2)

## 2021-06-21 LAB — LIPASE, BLOOD: Lipase: 46 U/L (ref 11–51)

## 2021-06-21 LAB — TROPONIN I (HIGH SENSITIVITY)
Troponin I (High Sensitivity): 7 ng/L
Troponin I (High Sensitivity): 8 ng/L

## 2021-06-21 LAB — LACTIC ACID, PLASMA: Lactic Acid, Venous: 1.1 mmol/L (ref 0.5–1.9)

## 2021-06-21 IMAGING — CT CT ABD-PELV W/ CM
2 of 5 series · 15 of 46 positions shown, 17 images · IV contrast (agent unspecified)
Comparison: [DATE].

CLINICAL DATA: Nausea and vomiting.

EXAM:
CT ABDOMEN AND PELVIS WITH CONTRAST
TECHNIQUE: Multidetector CT imaging of the abdomen and pelvis was performed
using the standard protocol following bolus administration of
intravenous contrast.

[Series 2: axial st · axial · 0.70mm/px · z∈[-578,-213]mm · 12 of 85 slices shown, 14 images]
[im 6/85  soft-tissue]
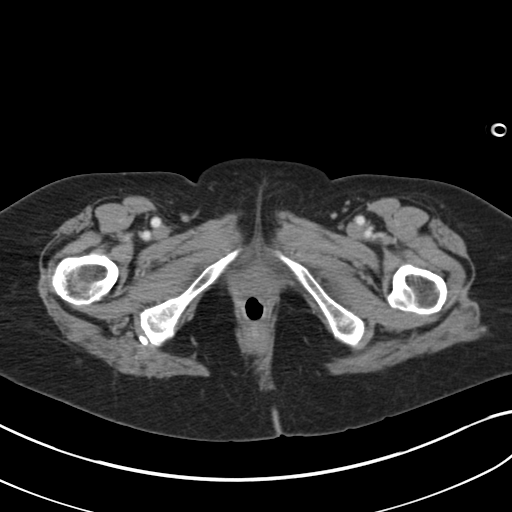
[im 6/85  bone]
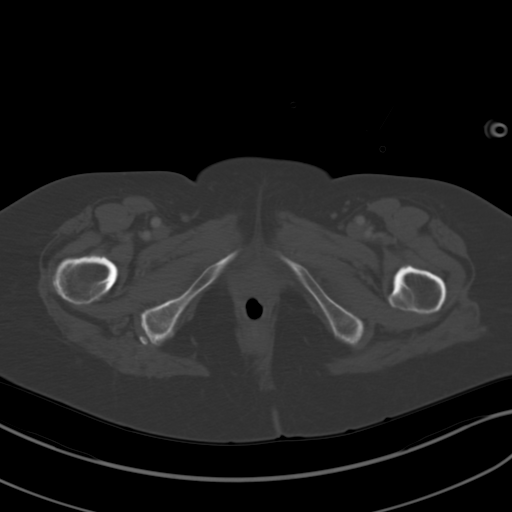
[im 12/85  soft-tissue]
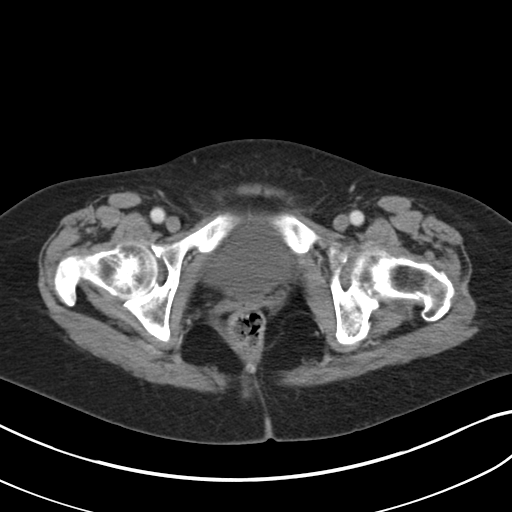
[im 17/85  soft-tissue]
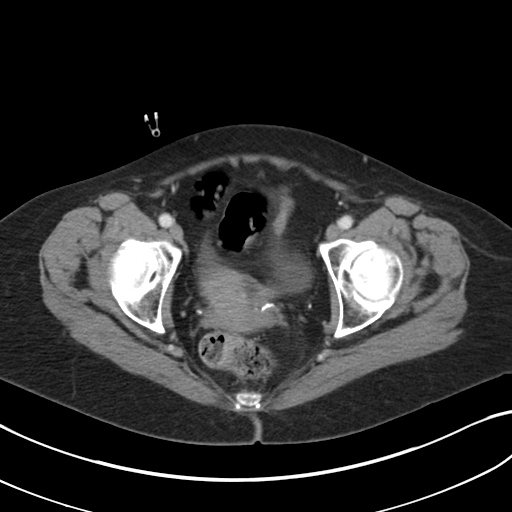
[im 29/85  soft-tissue]
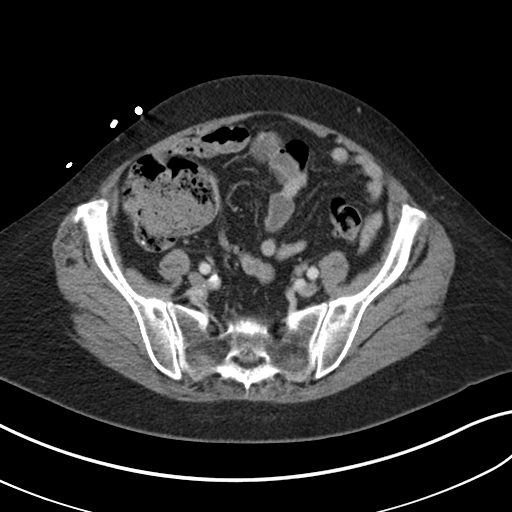
[im 34/85  soft-tissue]
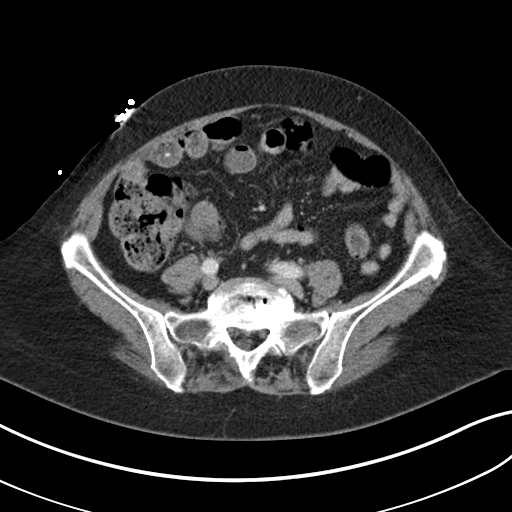
[im 40/85  soft-tissue]
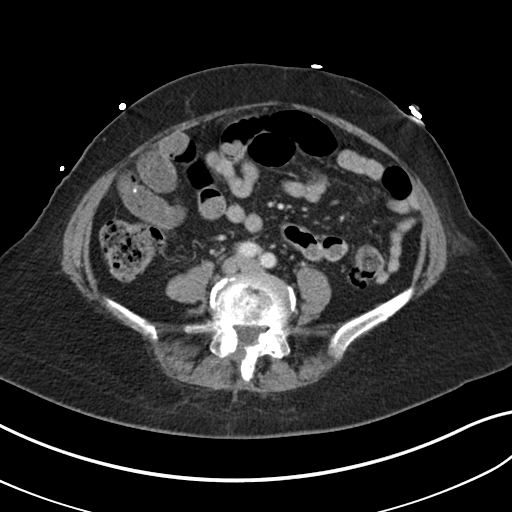
[im 45/85  soft-tissue]
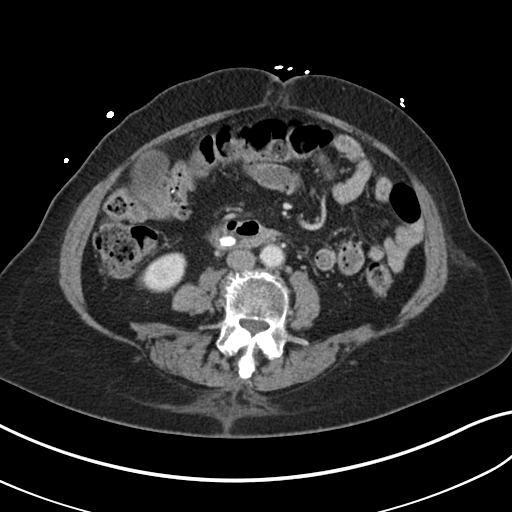
[im 51/85  soft-tissue]
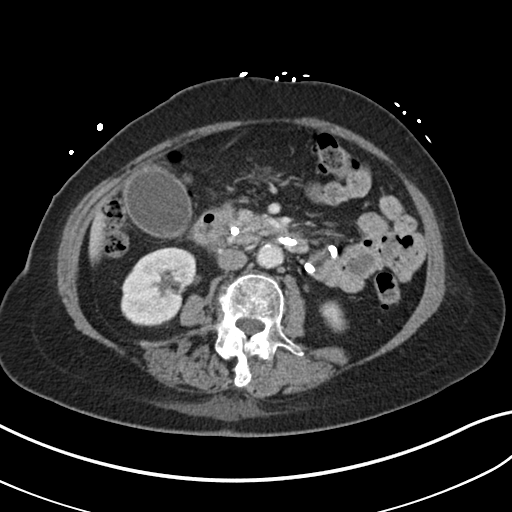
[im 57/85  soft-tissue]
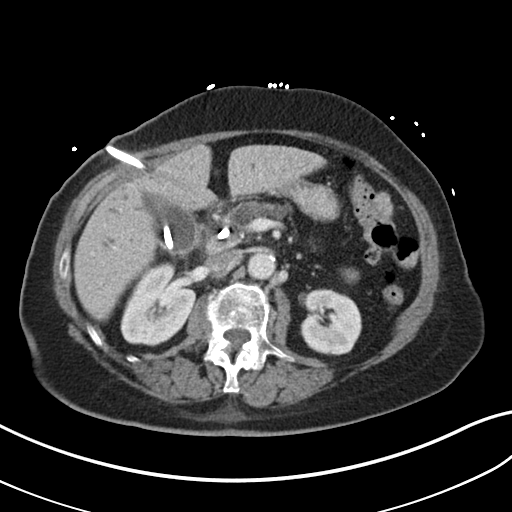
[im 57/85  bone]
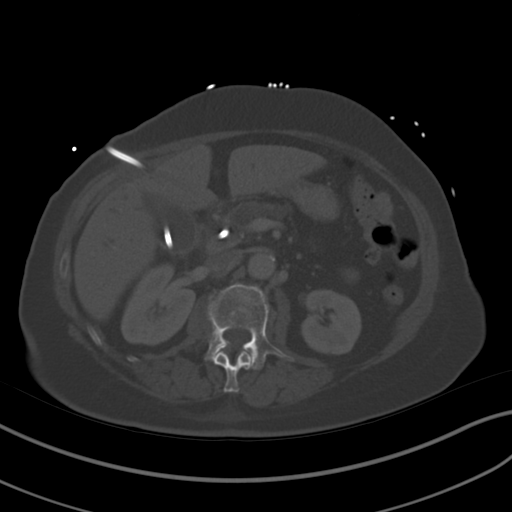
[im 68/85  soft-tissue]
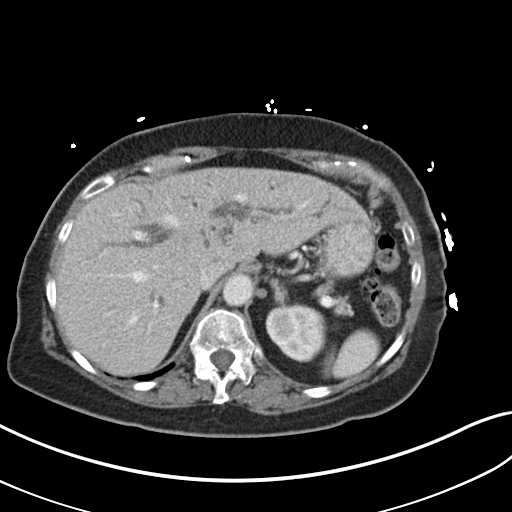
[im 73/85  soft-tissue]
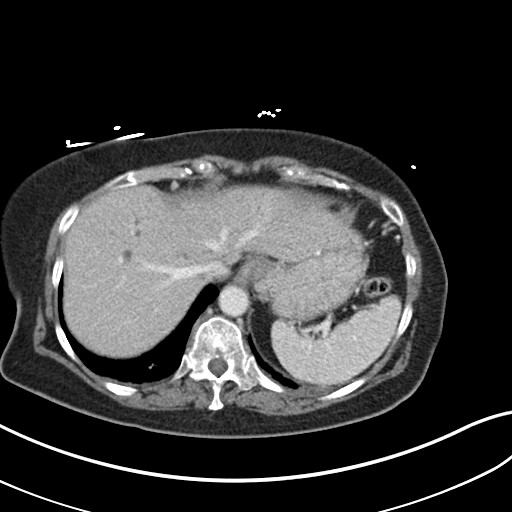
[im 79/85  soft-tissue]
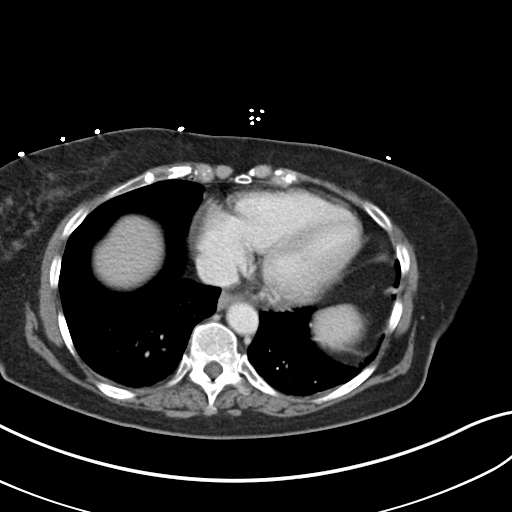

[Series 5: coronal st · coronal · 0.79mm/px · 3 of 140 slices shown]
[im 47/140  soft-tissue]
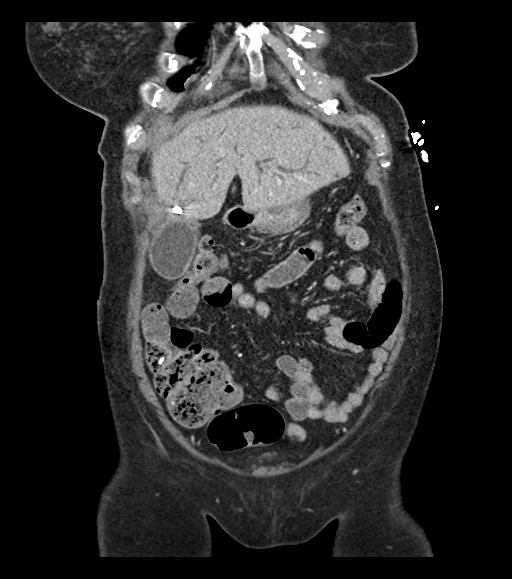
[im 62/140  soft-tissue]
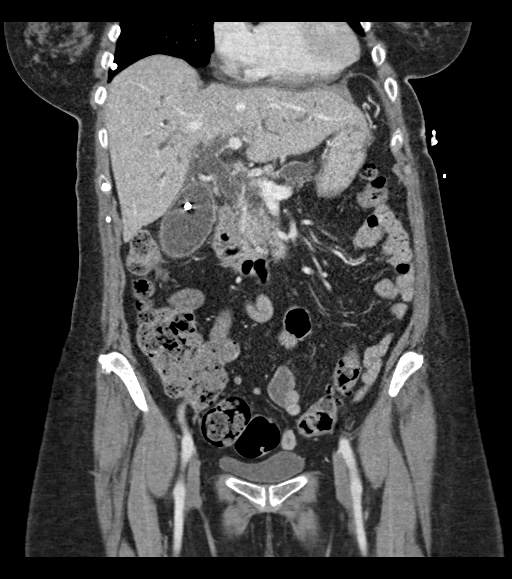
[im 78/140  soft-tissue]
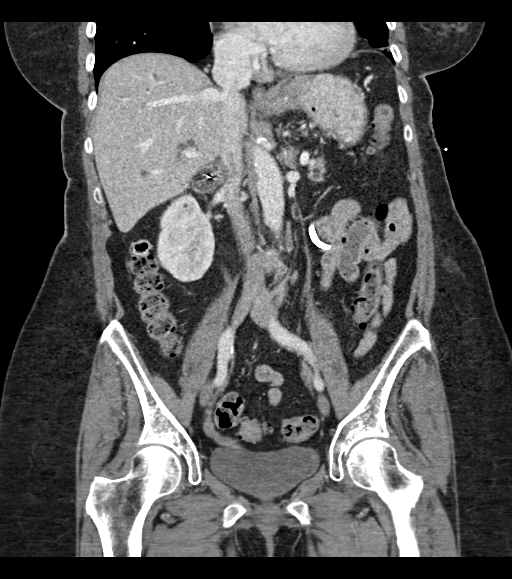

[15 of 46 positions shown; findings below may reference images not displayed]

RADIATION DOSE REDUCTION: This exam was performed according to the
departmental dose-optimization program which includes automated
exposure control, adjustment of the mA and/or kV according to
patient size and/or use of iterative reconstruction technique.

CONTRAST:  100mL OMNIPAQUE IOHEXOL 300 MG/ML  SOLN
FINDINGS: Lower chest: Atelectasis is present at the lung bases. There is a
calcified granuloma in the right middle lobe.

Hepatobiliary: No focal abnormality. There is intrahepatic and
extrahepatic biliary ductal dilatation. The common bile duct
measures 1.4 cm. A percutaneous cholecystostomy tube enters the
gallbladder and traverses the common bile duct and terminates in the
distal duodenum. No cholelithiasis. There is mild gallbladder wall
thickening with fat stranding at the gallbladder fossa.

Pancreas: No peripancreatic fat stranding. Pancreatic duct is
markedly distended at 1.2 cm, not significantly changed from the
prior exam.

Spleen: Normal in size without focal abnormality.

Adrenals/Urinary Tract: The adrenal glands are within normal limits.
There is a cyst in the lower pole the right kidney. Subcentimeter
hypodensities are present in the kidneys bilaterally which are too
small to further characterize. No renal calculus or hydronephrosis.
No hydronephrosis.

Stomach/Bowel: The stomach is within normal limits. A oval density
is present in the proximal duodenum measuring 1.1 cm proximal to the
biliary drain, possible ingested debris. There is mild fat stranding
and bowel wall thickening at the proximal duodenum, possible
duodenitis. No bowel obstruction, free air, or pneumatosis. There is
colonic diverticulosis without diverticulitis. A normal appendix is
present in the right lower quadrant.

Vascular/Lymphatic: Aortic atherosclerosis. Prominent lymph nodes
are present at the porta hepatis.

Reproductive: Uterus and bilateral adnexa are unremarkable.

Other: No free fluid.  Fat containing umbilical hernia.

Musculoskeletal: Degenerative changes are present in the
thoracolumbar spine. No acute or suspicious osseous abnormality.
IMPRESSION: 1. Stable biliary ductal dilatation with percutaneous
cholecystostomy biliary drainage catheter in place. Mild gallbladder
wall thickening with fat stranding in the porta hepatis, which may
be reactive
2. 1.1 cm density in the duodenum at the junction of the second and
third portion of the duodenum proximal to the drain, possible
ingested material. Mild duodenal wall thickening and fat stranding
are seen in this region, possible duodenitis.
3. Stable pancreatic ductal dilatation.
4. Aortic atherosclerosis.
5. Remaining findings as described above.

## 2021-06-21 MED ORDER — HYDROMORPHONE HCL 1 MG/ML IJ SOLN
0.5000 mg | Freq: Once | INTRAMUSCULAR | Status: AC
  Administered 2021-06-21: 0.5 mg via INTRAVENOUS
  Filled 2021-06-21: qty 1

## 2021-06-21 MED ORDER — CIPROFLOXACIN IN D5W 400 MG/200ML IV SOLN
400.0000 mg | Freq: Once | INTRAVENOUS | Status: AC
  Administered 2021-06-21: 400 mg via INTRAVENOUS
  Filled 2021-06-21: qty 200

## 2021-06-21 MED ORDER — IOHEXOL 300 MG/ML  SOLN
100.0000 mL | Freq: Once | INTRAMUSCULAR | Status: AC | PRN
  Administered 2021-06-21: 100 mL via INTRAVENOUS

## 2021-06-21 MED ORDER — PROCHLORPERAZINE EDISYLATE 10 MG/2ML IJ SOLN
10.0000 mg | Freq: Once | INTRAMUSCULAR | Status: AC
  Administered 2021-06-21: 10 mg via INTRAVENOUS
  Filled 2021-06-21: qty 2

## 2021-06-21 MED ORDER — SODIUM CHLORIDE 0.9 % IV BOLUS
1000.0000 mL | Freq: Once | INTRAVENOUS | Status: AC
  Administered 2021-06-21: 1000 mL via INTRAVENOUS

## 2021-06-21 MED ORDER — METRONIDAZOLE 500 MG/100ML IV SOLN
500.0000 mg | Freq: Two times a day (BID) | INTRAVENOUS | Status: DC
  Filled 2021-06-21: qty 100

## 2021-06-21 MED ORDER — SODIUM CHLORIDE 0.9 % IV SOLN: INTRAVENOUS | Status: DC

## 2021-06-21 NOTE — ED Notes (Signed)
Medications running through pump.  Channel was not recognized by system.  ?

## 2021-06-21 NOTE — ED Provider Notes (Addendum)
?[facility] DEPT ?Provider Note ? ?CSN: [id number] ?Arrival date & time: 06/21/21 0234 ? ?Chief Complaint(s) ?Abdominal Pain (Pt arrived via EMS from home reporting pain midsternal chest pain that feels like pressure for the last 4-5 hours and increased nausea. Pt states she has a whipple operation scheduled at [facility] tomorrow and was advised by her care team to come to the ED. ) ? ?HPI ?Tonya Jenkins is a 76 y.o. female with a past medical history listed below including obstructive pancreatic cancer requiring biliary drain recently admitted for cholangitis currently on Cipro Flagyl who presents to the emergency department with several hours of epigastric abdominal pain going into her lower chest with nausea. No SOB. No emesis. Endorses subjective fevers. No coughing or congestion. ? ?The history is provided by the patient.  ? ?Past Medical History ?Past Medical History:  ?Diagnosis Date  ? History of hip surgery   ? Hx of neck surgery   ? Hypertension   ? ?Patient Active Problem List  ? Diagnosis Date Noted  ? Genetic testing 05/31/2021  ? Pancreatic cancer ([place]) 05/20/2021  ? Obstructive jaundice secondary to pancreatic mass 05/12/2021  ? Normocytic anemia 05/12/2021  ? Essential hypertension 05/12/2021  ? Pancreatic mass 05/12/2021  ? Hypokalemia 05/12/2021  ? Possible AKI (acute kidney injury) ([place]) 05/12/2021  ? Abnormal urinalysis 05/12/2021  ? Hyperbilirubinemia 05/12/2021  ? Elevated liver enzymes 05/12/2021  ? Hyperglycemia 05/12/2021  ? OSA on CPAP 05/12/2021  ? Snoring 01/01/2021  ? Closed fracture of metatarsal bone 12/10/2018  ? Right foot pain 02/05/2018  ? Hallux valgus of right foot 11/09/2017  ? Osteoarthritis of knee 10/14/2015  ? ?Home Medication(s) ?Prior to Admission medications   ?Medication Sig Start Date End Date Taking? Authorizing Provider  ?B Complex Vitamins (VITAMIN B COMPLEX PO) Take 1 capsule by mouth daily.   Yes [provider]  ?ciprofloxacin  (CIPRO) 500 MG tablet Take 500 mg by mouth 2 (two) times daily. 06/17/21  Yes [provider]  ?diltiazem (DILACOR XR) 180 MG 24 hr capsule Take 180 mg by mouth at bedtime. 04/27/21  Yes [provider]  ?enoxaparin (LOVENOX) 40 MG/0.4ML injection Inject 40 mg into the skin daily. 06/17/21  Yes [provider]  ?melatonin 3 MG TABS tablet Take 3 mg by mouth at bedtime.   Yes [provider]  ?metroNIDAZOLE (FLAGYL) 500 MG tablet Take 500 mg by mouth 2 (two) times daily. 06/17/21  Yes [provider]  ?olmesartan (BENICAR) 40 MG tablet Take 40 mg by mouth at bedtime. 03/17/20  Yes [provider]  ?ondansetron (ZOFRAN) 8 MG tablet Take 1 tablet (8 mg total) by mouth every 8 (eight) hours as needed for nausea or vomiting. 05/20/21  Yes Heilingoetter, Cassandra L, PA-C  ?Potassium Chloride ER 20 MEQ TBCR Take 20 mEq by mouth daily. 06/17/21  Yes [provider]  ?Vitamin D, Ergocalciferol, (DRISDOL) 1.25 MG (50000 UNIT) CAPS capsule Take 50,000 Units by mouth once a week. 05/28/21  Yes [provider]  ?HYDROmorphone (DILAUDID) 2 MG tablet Take 2 mg by mouth every 4 (four) hours as needed. ?Patient not taking: Reported on 06/21/2021 06/17/21   [provider]  ?hydrOXYzine (ATARAX) 25 MG tablet Take 25 mg by mouth every 8 (eight) hours as needed for anxiety or itching. ?Patient not taking: Reported on 06/21/2021 05/06/21   [provider]  ?NON FORMULARY CPAP at bedtime    [provider]  ?traMADol (ULTRAM) 50 MG tablet Take  1 tablet (50 mg total) by mouth every 6 (six) hours as needed for severe pain. ?Patient not taking: Reported on 06/21/2021 05/17/21   Elgergawy, Silver Huguenin, MD  ?                                                                                                                                  ?Allergies ?Amlodipine besylate, Hydrochlorothiazide, Lexapro [escitalopram oxalate], Oxycodone, and Penicillins ? ?Review of  Systems ?Review of Systems ?As noted in HPI ? ?Physical Exam ?Vital Signs  ?I have reviewed the triage vital signs ?BP 128/66   Pulse 66   Temp 99.1 ?F (37.3 ?C) (Oral)   Resp 16   SpO2 100%  ? ?Physical Exam ?Vitals reviewed.  ?Constitutional:   ?   General: She is not in acute distress. ?   Appearance: She is well-developed. She is not diaphoretic.  ?HENT:  ?   Head: Normocephalic and atraumatic.  ?   Nose: Nose normal.  ?Eyes:  ?   General: No scleral icterus.    ?   Right eye: No discharge.     ?   Left eye: No discharge.  ?   Conjunctiva/sclera: Conjunctivae normal.  ?   Pupils: Pupils are equal, round, and reactive to light.  ?Cardiovascular:  ?   Rate and Rhythm: Normal rate and regular rhythm.  ?   Heart sounds: No murmur heard. ?  No friction rub. No gallop.  ?Pulmonary:  ?   Effort: Pulmonary effort is normal. No respiratory distress.  ?   Breath sounds: Normal breath sounds. No stridor. No rales.  ?Abdominal:  ?   General: There is no distension.  ?   Palpations: Abdomen is soft.  ?   Tenderness: There is abdominal tenderness in the right upper quadrant, epigastric area and periumbilical area. There is no guarding or rebound.  ? ? ?Musculoskeletal:     ?   General: No tenderness.  ?   Cervical back: Normal range of motion and neck supple.  ?Skin: ?   General: Skin is warm and dry.  ?   Findings: No erythema or rash.  ?Neurological:  ?   Mental Status: She is alert and oriented to person, place, and time.  ? ? ?ED Results and Treatments ?Labs ?(all labs ordered are listed, but only abnormal results are displayed) ?Labs Reviewed  ?COMPREHENSIVE METABOLIC PANEL - Abnormal; Notable for the following components:  ?    Result Value  ? Glucose, Bld 199 (*)   ? AST 295 (*)   ? ALT 254 (*)   ? Alkaline Phosphatase 442 (*)   ? Total Bilirubin 2.1 (*)   ? All other components within normal limits  ?CBC WITH DIFFERENTIAL/PLATELET - Abnormal; Notable for the following components:  ? WBC 10.8 (*)   ? RBC 3.79 (*)    ? Hemoglobin 10.7 (*)   ? HCT 32.6 (*)   ?  RDW 17.2 (*)   ? Platelets 402 (*)   ? Neutro Abs 8.8 (*)   ? Monocytes Absolute 1.1 (*)   ? Abs Immature Granulocytes 0.08 (*)   ? All other components within normal limits  ?URINALYSIS, ROUTINE W REFLEX MICROSCOPIC - Abnormal; Notable for the following components:  ? APPearance HAZY (*)   ? Glucose, UA 50 (*)   ? Hgb urine dipstick SMALL (*)   ? All other components within normal limits  ?LIPASE, BLOOD  ?LACTIC ACID, PLASMA  ?PROTIME-INR  ?TROPONIN I (HIGH SENSITIVITY)  ?TROPONIN I (HIGH SENSITIVITY)  ?                                                                                                                       ?EKG ? EKG Interpretation ? ?Date/Time:  Monday June 21 2021 03:06:43 EDT ?Ventricular Rate:  76 ?PR Interval:  148 ?QRS Duration: 103 ?QT Interval:  391 ?QTC Calculation: 440 ?R Axis:   53 ?Text Interpretation: Sinus rhythm Low voltage, precordial leads Confirmed by Addison Lank [phone]) on 06/21/2021 5:06:43 AM ?  ? ?  ? ?Radiology ?CT ABDOMEN PELVIS W CONTRAST ? ?Result Date: 06/21/2021 ?CLINICAL DATA:  Nausea and vomiting. EXAM: CT ABDOMEN AND PELVIS WITH CONTRAST TECHNIQUE: Multidetector CT imaging of the abdomen and pelvis was performed using the standard protocol following bolus administration of intravenous contrast. RADIATION DOSE REDUCTION: This exam was performed according to the departmental dose-optimization program which includes automated exposure control, adjustment of the mA and/or kV according to patient size and/or use of iterative reconstruction technique. CONTRAST:  179m OMNIPAQUE IOHEXOL 300 MG/ML  SOLN COMPARISON:  05/12/2021. FINDINGS: Lower chest: Atelectasis is present at the lung bases. There is a calcified granuloma in the right middle lobe. Hepatobiliary: No focal abnormality. There is intrahepatic and extrahepatic biliary ductal dilatation. The common bile duct measures 1.4 cm. A percutaneous cholecystostomy tube enters the  gallbladder and traverses the common bile duct and terminates in the distal duodenum. No cholelithiasis. There is mild gallbladder wall thickening with fat stranding at the gallbladder fossa. Pancreas: No peripan

## 2021-06-21 NOTE — ED Notes (Signed)
Report and all paperwork given to [facility].  Flagyl given to [facility] also.  ?

## 2021-06-22 HISTORY — PX: PANCREATICODUODENECTOMY: SUR1000

## 2021-07-02 LAB — ACID FAST CULTURE WITH REFLEXED SENSITIVITIES (MYCOBACTERIA): Acid Fast Culture: NEGATIVE

## 2021-07-06 ENCOUNTER — Inpatient Hospital Stay (HOSPITAL_COMMUNITY)
Admit: 2021-07-06 | Discharge: 2021-07-06 | Disposition: A | Discharge status: Home / Self Care | Payer: Medicare Other | Attending: Student | Admitting: Student

## 2021-07-08 ENCOUNTER — Other Ambulatory Visit (HOSPITAL_COMMUNITY): Payer: Medicare Other

## 2021-07-13 DIAGNOSIS — G4733 Obstructive sleep apnea (adult) (pediatric): Secondary | ICD-10-CM

## 2021-07-13 DIAGNOSIS — C259 Malignant neoplasm of pancreas, unspecified: Secondary | ICD-10-CM

## 2021-07-13 DIAGNOSIS — R6520 Severe sepsis without septic shock: Secondary | ICD-10-CM

## 2021-07-14 DIAGNOSIS — R6520 Severe sepsis without septic shock: Secondary | ICD-10-CM

## 2021-07-14 DIAGNOSIS — G4733 Obstructive sleep apnea (adult) (pediatric): Secondary | ICD-10-CM

## 2021-07-14 DIAGNOSIS — C259 Malignant neoplasm of pancreas, unspecified: Secondary | ICD-10-CM

## 2021-07-20 DIAGNOSIS — G4733 Obstructive sleep apnea (adult) (pediatric): Secondary | ICD-10-CM | POA: Diagnosis not present

## 2021-07-30 ENCOUNTER — Encounter: Payer: Self-pay | Admitting: Internal Medicine

## 2021-08-04 ENCOUNTER — Encounter (HOSPITAL_COMMUNITY): Payer: Self-pay | Admitting: Emergency Medicine

## 2021-08-04 ENCOUNTER — Emergency Department (HOSPITAL_COMMUNITY): Payer: Medicare Other

## 2021-08-04 ENCOUNTER — Other Ambulatory Visit: Payer: Self-pay

## 2021-08-04 ENCOUNTER — Emergency Department (HOSPITAL_COMMUNITY)
Admission: EM | Admit: 2021-08-04 | Discharge: 2021-08-04 | Disposition: A | Discharge status: Home / Self Care | Payer: Medicare Other | Attending: Emergency Medicine | Admitting: Emergency Medicine

## 2021-08-04 DIAGNOSIS — R059 Cough, unspecified: Secondary | ICD-10-CM | POA: Insufficient documentation

## 2021-08-04 DIAGNOSIS — N644 Mastodynia: Secondary | ICD-10-CM | POA: Diagnosis present

## 2021-08-04 DIAGNOSIS — R0602 Shortness of breath: Secondary | ICD-10-CM

## 2021-08-04 DIAGNOSIS — R140 Abdominal distension (gaseous): Secondary | ICD-10-CM | POA: Insufficient documentation

## 2021-08-04 DIAGNOSIS — Z8507 Personal history of malignant neoplasm of pancreas: Secondary | ICD-10-CM | POA: Insufficient documentation

## 2021-08-04 DIAGNOSIS — R110 Nausea: Secondary | ICD-10-CM | POA: Insufficient documentation

## 2021-08-04 LAB — COMPREHENSIVE METABOLIC PANEL
ALT: 24 U/L (ref 0–44)
AST: 28 U/L (ref 15–41)
Albumin: 3 g/dL — ABNORMAL LOW (ref 3.5–5.0)
Alkaline Phosphatase: 98 U/L (ref 38–126)
Anion gap: 9 (ref 5–15)
BUN: 13 mg/dL (ref 8–23)
CO2: 27 mmol/L (ref 22–32)
Calcium: 8.6 mg/dL — ABNORMAL LOW (ref 8.9–10.3)
Chloride: 103 mmol/L (ref 98–111)
Creatinine, Ser: 0.86 mg/dL (ref 0.44–1.00)
GFR, Estimated: >60 mL/min (ref >60)
Glucose, Bld: 126 mg/dL — ABNORMAL HIGH (ref 70–99)
Potassium: 3.4 mmol/L — ABNORMAL LOW (ref 3.5–5.1)
Sodium: 139 mmol/L (ref 135–145)
Total Bilirubin: 0.6 mg/dL (ref 0.3–1.2)
Total Protein: 6.7 g/dL (ref 6.5–8.1)

## 2021-08-04 LAB — CBC WITH DIFFERENTIAL/PLATELET
Abs Immature Granulocytes: 0.02 10*3/uL (ref 0.00–0.07)
Basophils Absolute: 0.1 10*3/uL (ref 0.0–0.1)
Basophils Relative: 1 %
Eosinophils Absolute: 0.2 10*3/uL (ref 0.0–0.5)
Eosinophils Relative: 3 %
HCT: 35.9 % — ABNORMAL LOW (ref 36.0–46.0)
Hemoglobin: 11.7 g/dL — ABNORMAL LOW (ref 12.0–15.0)
Immature Granulocytes: 0 %
Lymphocytes Relative: 29 %
Lymphs Abs: 2 10*3/uL (ref 0.7–4.0)
MCH: 29.5 pg (ref 26.0–34.0)
MCHC: 32.6 g/dL (ref 30.0–36.0)
MCV: 90.4 fL (ref 80.0–100.0)
Monocytes Absolute: 0.5 10*3/uL (ref 0.1–1.0)
Monocytes Relative: 7 %
Neutro Abs: 4.2 10*3/uL (ref 1.7–7.7)
Neutrophils Relative %: 60 %
Platelets: 380 10*3/uL (ref 150–400)
RBC: 3.97 MIL/uL (ref 3.87–5.11)
RDW: 17.7 % — ABNORMAL HIGH (ref 11.5–15.5)
WBC: 7 10*3/uL (ref 4.0–10.5)
nRBC: 0 % (ref 0.0–0.2)

## 2021-08-04 LAB — TROPONIN I (HIGH SENSITIVITY)
Troponin I (High Sensitivity): 8 ng/L (ref <18)
Troponin I (High Sensitivity): 8 ng/L (ref <18)

## 2021-08-04 LAB — LIPASE, BLOOD: Lipase: 22 U/L (ref 11–51)

## 2021-08-04 LAB — BRAIN NATRIURETIC PEPTIDE: B Natriuretic Peptide: 51.5 pg/mL (ref 0.0–100.0)

## 2021-08-04 IMAGING — DX DG CHEST 1V PORT
1 series · 1 of 1 positions shown · non-contrast
Comparison: [DATE]

CLINICAL DATA: Left breast pain. Recent Whipple procedure for
pancreatic cancer.

EXAM:
PORTABLE CHEST 1 VIEW

[chest ap]
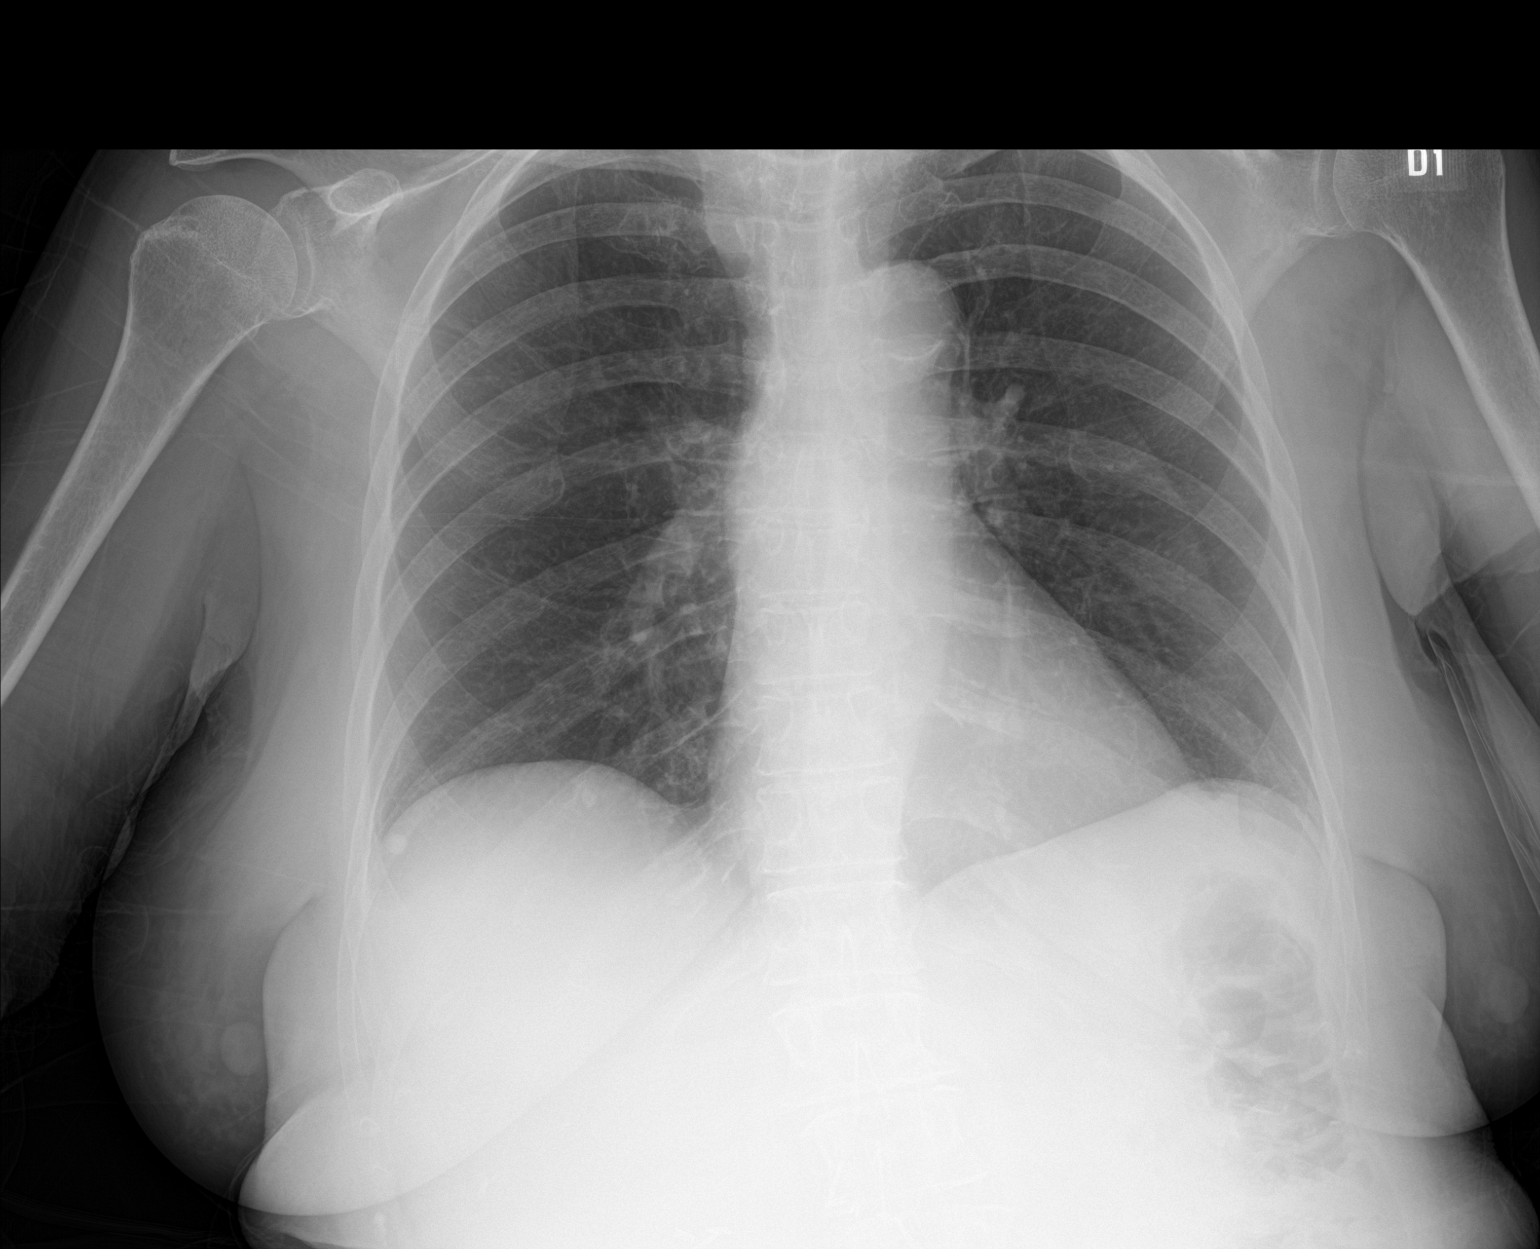

[1 of 1 positions shown; findings below may reference images not displayed]

FINDINGS: Calcified granuloma at the right lung base. The lungs appear
otherwise clear. Atherosclerotic calcification of the aortic arch.
Heart size within normal limits. No significant bony abnormality
identified.
IMPRESSION: 1.  No active cardiopulmonary disease is radiographically apparent.
2.  Aortic Atherosclerosis ([F1]-[F1]).

## 2021-08-04 IMAGING — CT CT ANGIO CHEST
2 of 6 series · 17 of 46 positions shown · IV contrast (agent unspecified)
Comparison: CT abdomen/pelvis dated [DATE]

CLINICAL DATA: Left upper abdominal/breast pain, recent surgery,
evaluate for PE



[Series 3: thins · axial · 0.67mm/px · z∈[-23,+223]mm · 14 of 270 slices shown]
[im 12/270  lung]
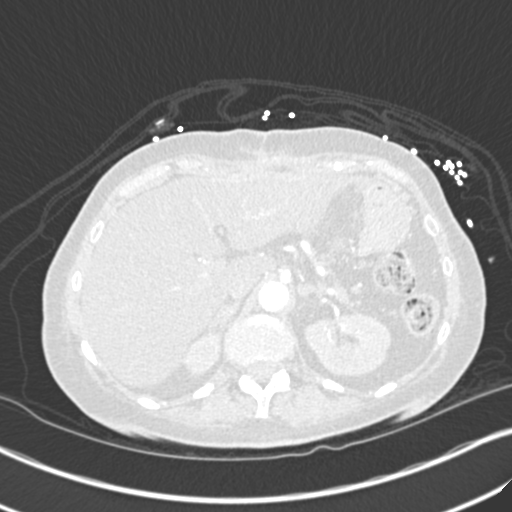
[im 36/270  soft-tissue]
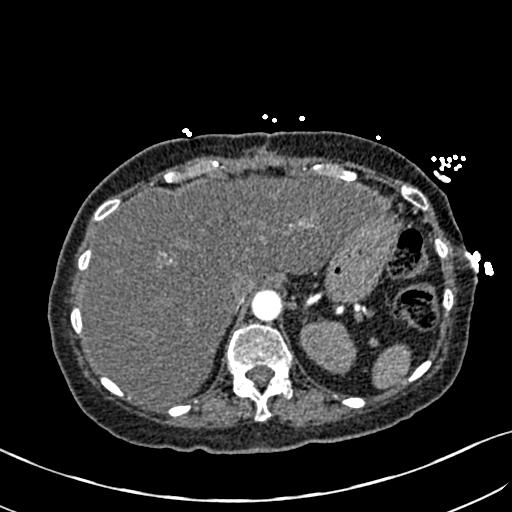
[im 47/270  lung]
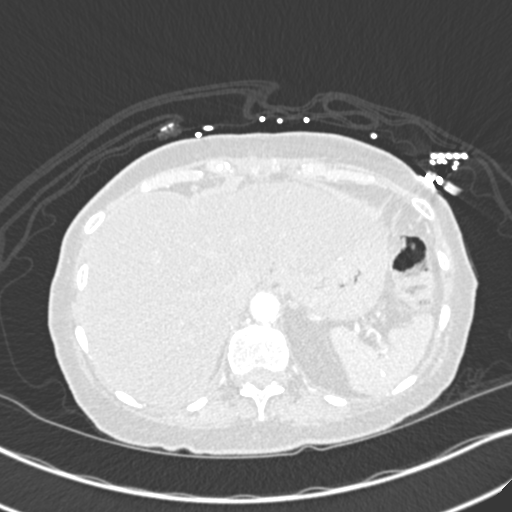
[im 71/270  soft-tissue]
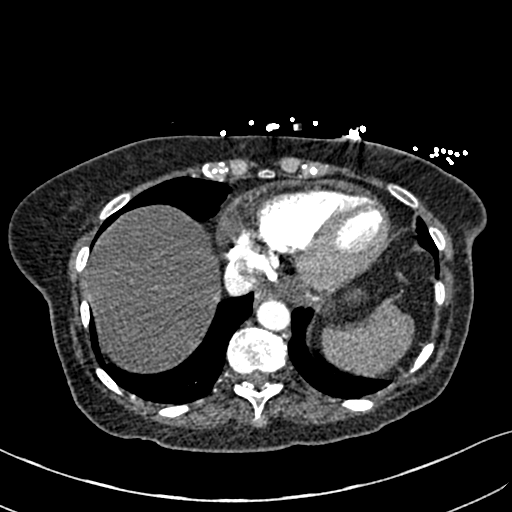
[im 94/270  lung]
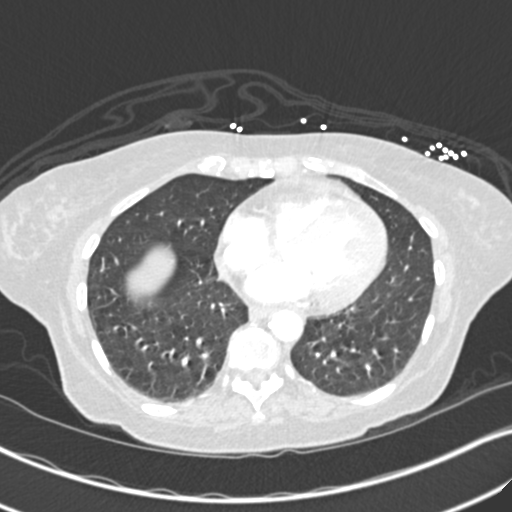
[im 106/270  soft-tissue]
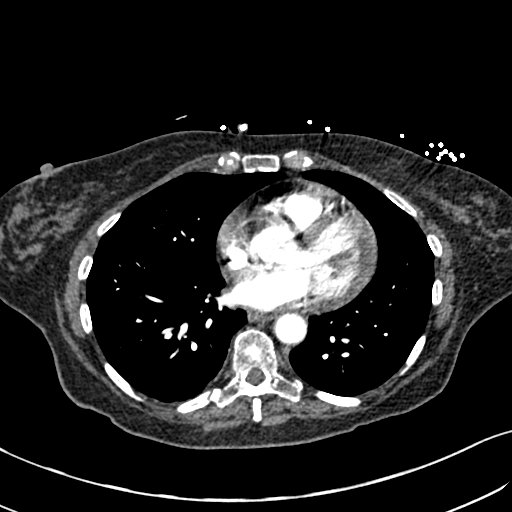
[im 129/270  lung]
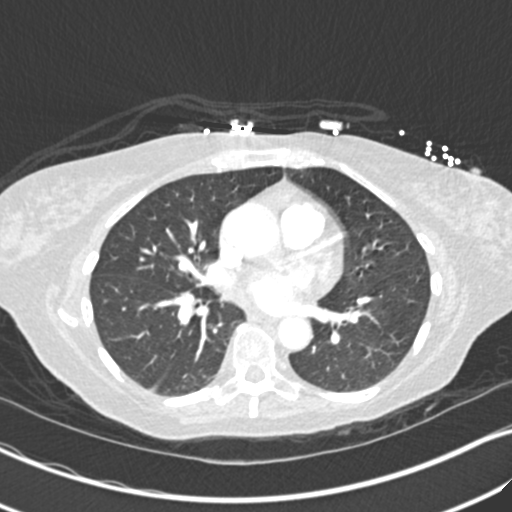
[im 141/270  soft-tissue]
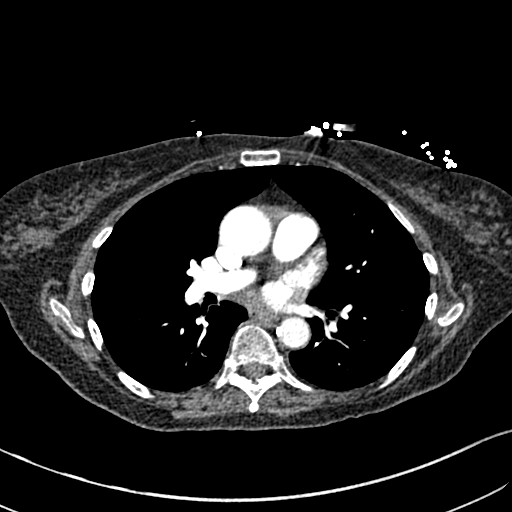
[im 164/270  lung]
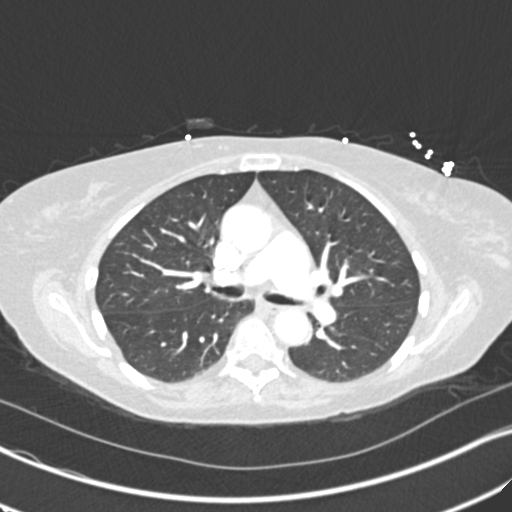
[im 176/270  soft-tissue]
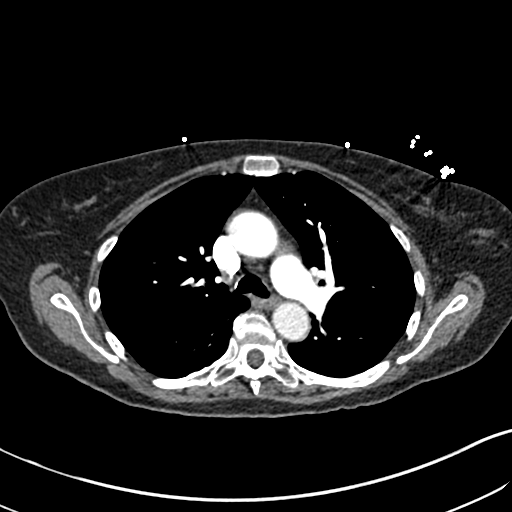
[im 199/270  lung]
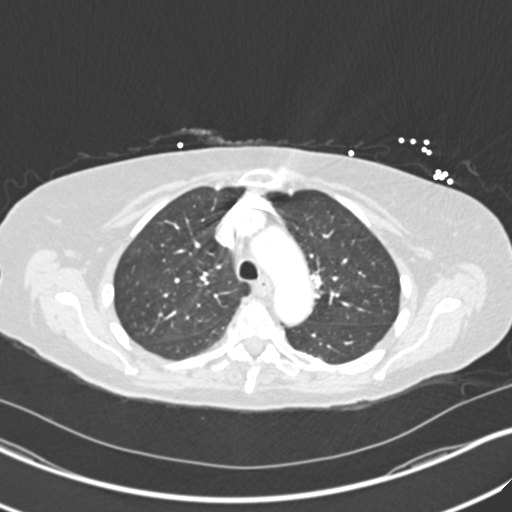
[im 223/270  soft-tissue]
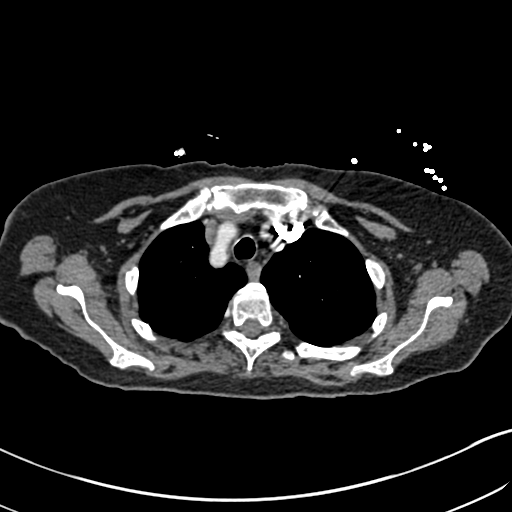
[im 234/270  lung]
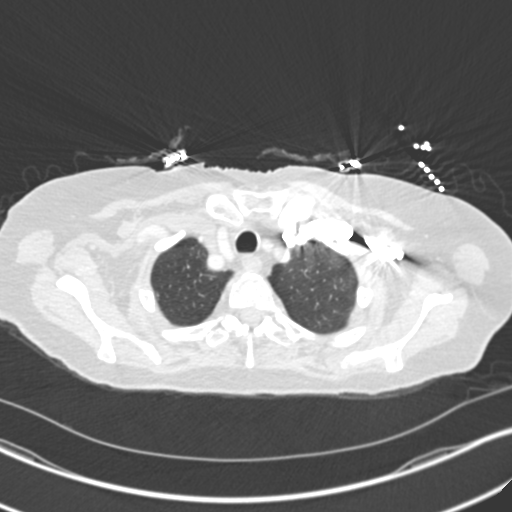
[im 258/270  soft-tissue]
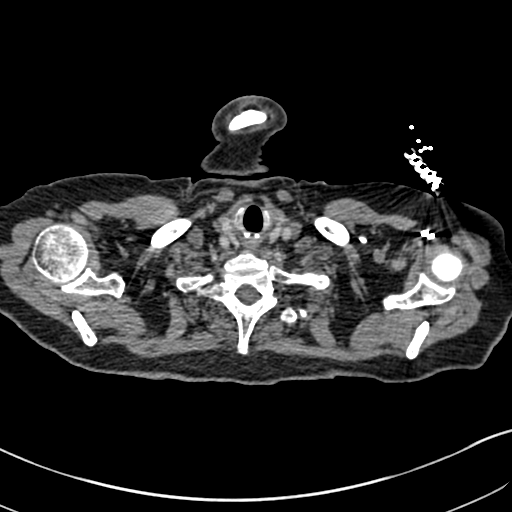

[Series 4: coronal mpr · coronal · 0.57mm/px · 3 of 107 slices shown]
[im 27/107  soft-tissue]
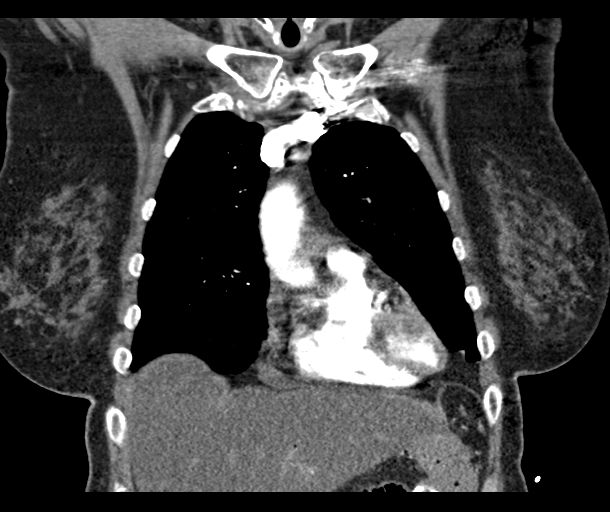
[im 54/107  soft-tissue]
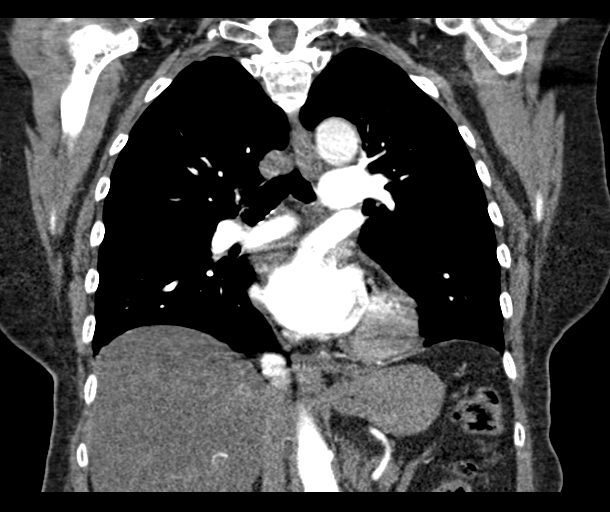
[im 80/107  soft-tissue]
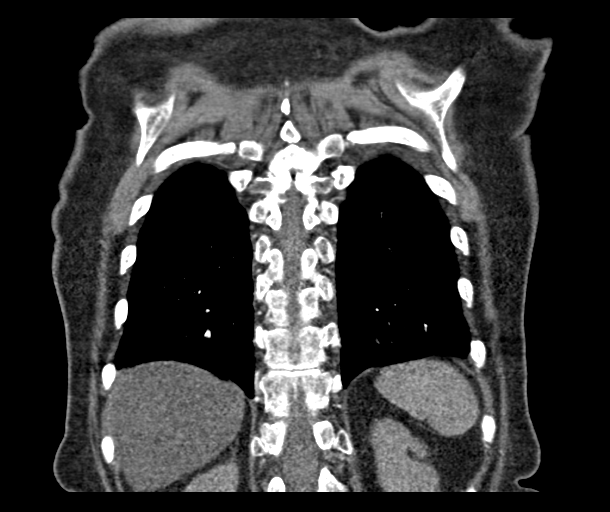

[17 of 46 positions shown; findings below may reference images not displayed]

RADIATION DOSE REDUCTION: This exam was performed according to the
departmental dose-optimization program which includes automated
exposure control, adjustment of the mA and/or kV according to
patient size and/or use of iterative reconstruction technique.

CONTRAST:  100mL OMNIPAQUE IOHEXOL 350 MG/ML SOLN
FINDINGS: CTA CHEST FINDINGS

Cardiovascular: Satisfactory opacification of the bilateral
pulmonary arteries to the segmental level. No evidence of pulmonary
embolism.

Although not tailored for evaluation of the thoracic aorta, there is
no evidence of thoracic aortic aneurysm or dissection.

Heart is normal in size. Small inferior pericardial fluid.

Mediastinum/Nodes: No suspicious mediastinal lymphadenopathy.

Visualized thyroid is unremarkable.

Lungs/Pleura: Lungs are clear.

No suspicious pulmonary nodules.

No focal consolidation.

No pleural effusion or pneumothorax.

Review of the MIP images confirms the above findings.

CT ABDOMEN and PELVIS FINDINGS

Hepatobiliary: Liver is notable for probable hepatic steatosis.

Status post cholecystectomy. Choledochojejunostomy. Mild
pneumobilia.

Pancreas: Proximal pancreatectomy with pancreaticoduodenostomy.

Spleen: Calcified splenic granulomata.

Adrenals/Urinary Tract: Adrenal glands are within normal limits.

11 mm right lower pole renal cyst (series 4/image 41). Left kidney
is within normal limits. No hydronephrosis.

Bladder is within normal limits.

Stomach/Bowel: Distal gastrectomy with gastrojejunostomy.

No evidence of bowel obstruction.

Normal appendix (series 4/image 55).

Left colonic diverticulosis, without evidence of diverticulitis.

Vascular/Lymphatic: No evidence of abdominal aortic aneurysm.

Atherosclerotic calcifications of the abdominal aorta and branch
vessels.

11 mm soft tissue nodule/node along the jejunal mesentery/right
para-aortic region (series 4/image 36), with adjacent stranding
(series 4/images 36 and 39). This is nonspecific on initial
postoperative evaluation, but warrants close attention on follow-up
to exclude residual/recurrent tumor.

Reproductive: Uterus is within normal limits.

Right ovary is within normal limits.  No left adnexal mass.

Other: No abdominopelvic ascites.

Postsurgical changes along the midline anterior abdominal wall with
1.5 x 3.5 cm seroma (series 4/image 30).

Musculoskeletal: Mild degenerative changes of the lumbar spine.

Review of the MIP images confirms the above findings.
IMPRESSION: No evidence of pulmonary embolism. No evidence of acute
cardiopulmonary disease.

Status post Whipple procedure, as described above.

11 mm soft tissue nodule/node along the jejunal mesentery, as
described above. This is nonspecific on initial postoperative
evaluation, but warrants close attention on follow-up to exclude
residual/recurrent tumor. Consider follow-up CT abdomen/pelvis or
PET-CT in 3 months.

## 2021-08-04 IMAGING — CT CT ABD-PELV W/ CM
2 of 5 series · 14 of 46 positions shown, 16 images · IV contrast (agent unspecified)
Comparison: CT abdomen/pelvis dated [DATE]

CLINICAL DATA: Left upper abdominal/breast pain, recent surgery,
evaluate for PE



[Series 4: axial st · axial · 0.70mm/px · z∈[-328,+37]mm · 11 of 89 slices shown, 13 images]
[im 8/89  soft-tissue]
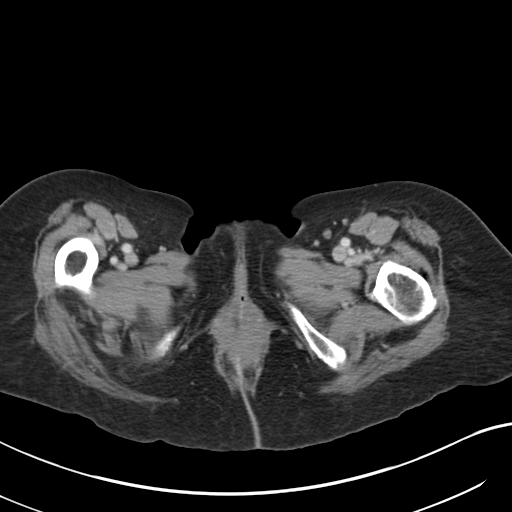
[im 8/89  bone]
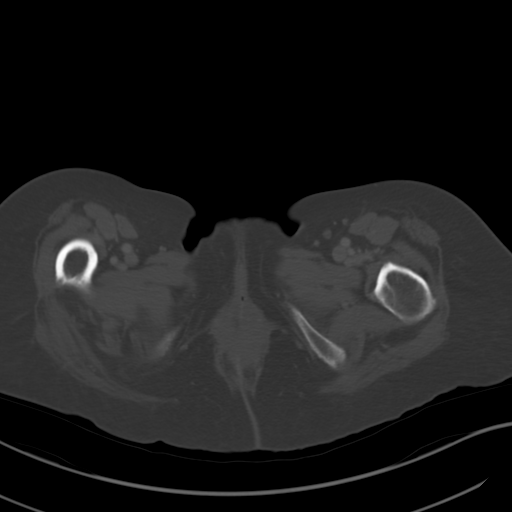
[im 15/89  soft-tissue]
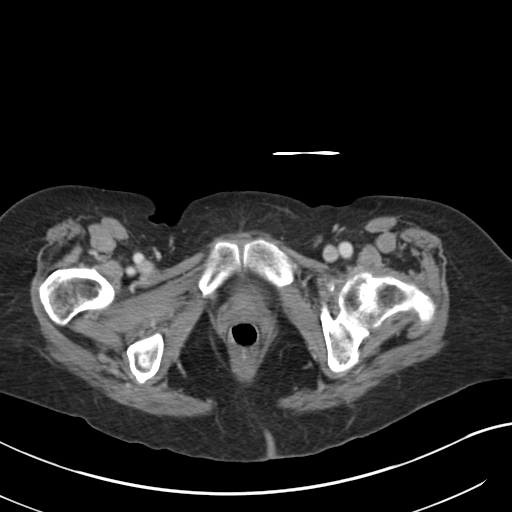
[im 23/89  soft-tissue]
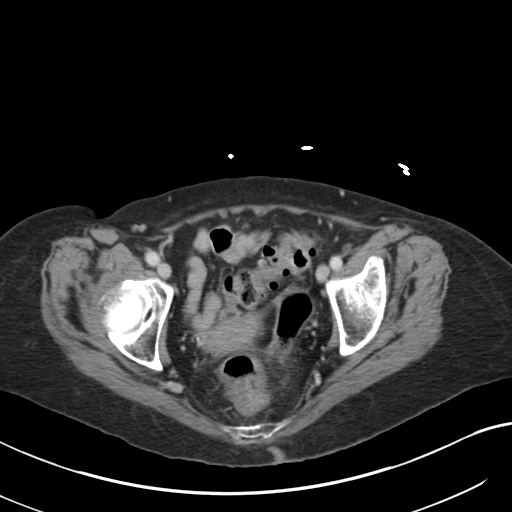
[im 30/89  soft-tissue]
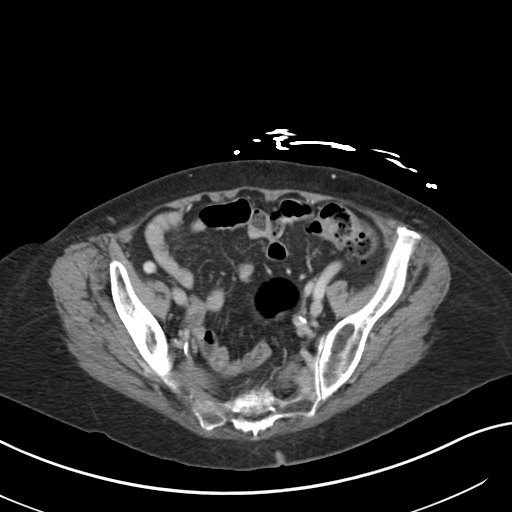
[im 37/89  soft-tissue]
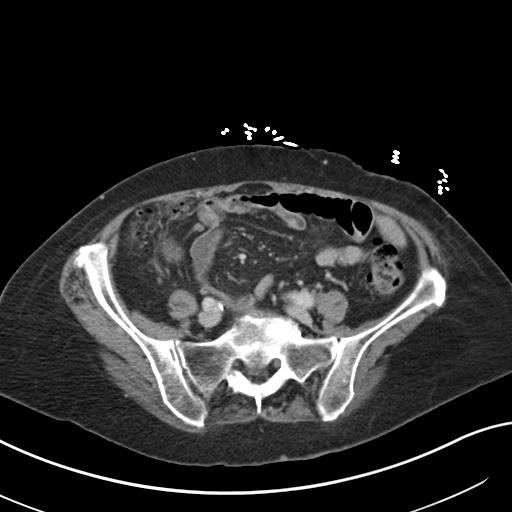
[im 45/89  soft-tissue]
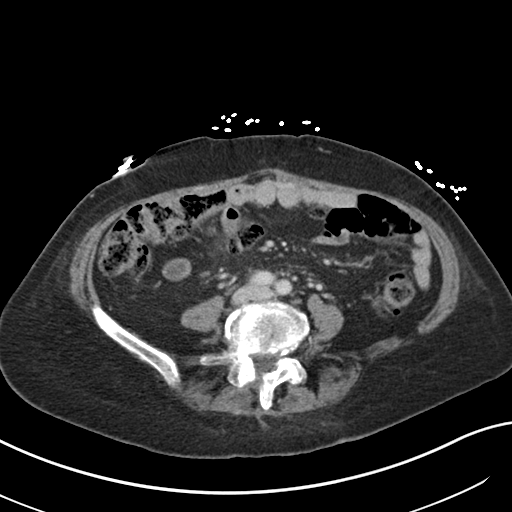
[im 52/89  soft-tissue]
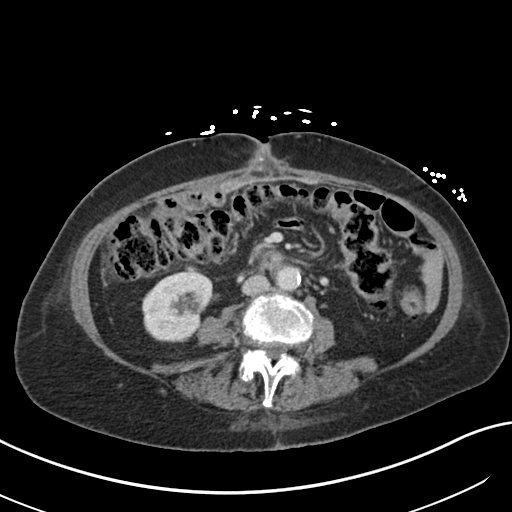
[im 59/89  soft-tissue]
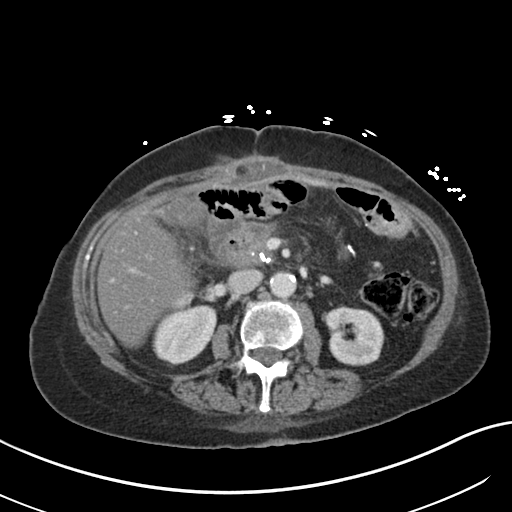
[im 67/89  soft-tissue]
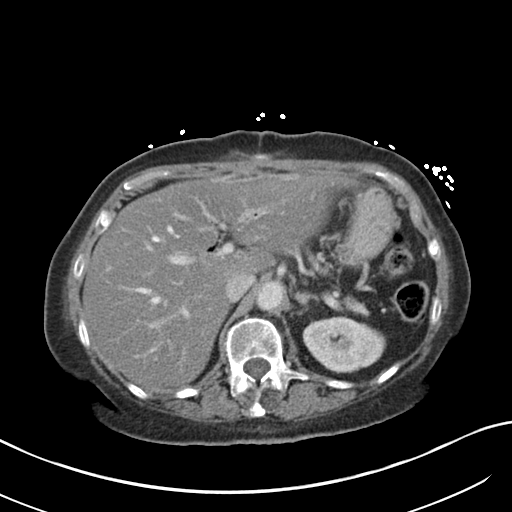
[im 67/89  bone]
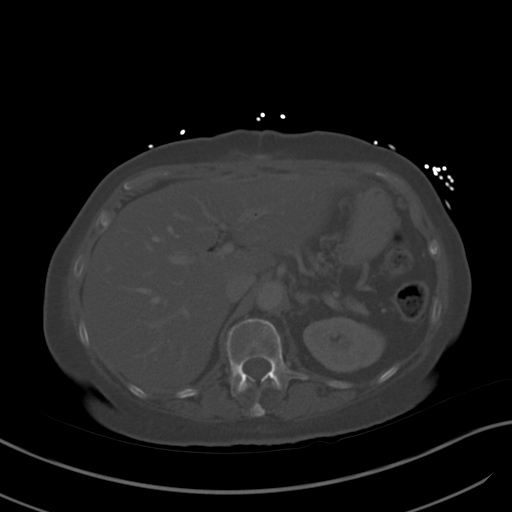
[im 74/89  soft-tissue]
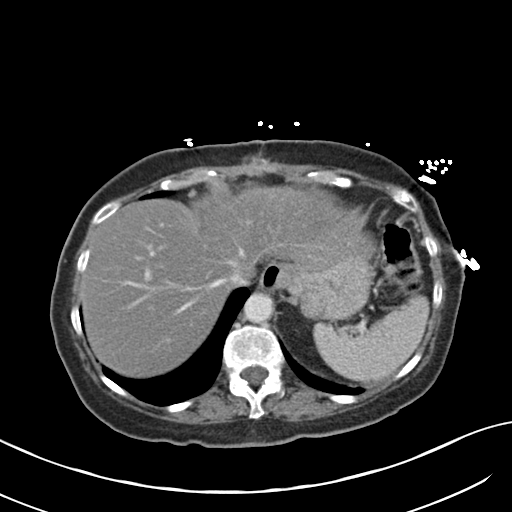
[im 81/89  soft-tissue]
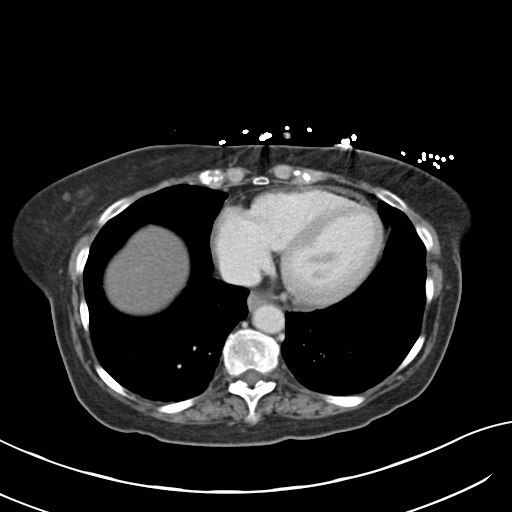

[Series 6: coronal st · coronal · 0.76mm/px · 3 of 77 slices shown]
[im 26/77  soft-tissue]
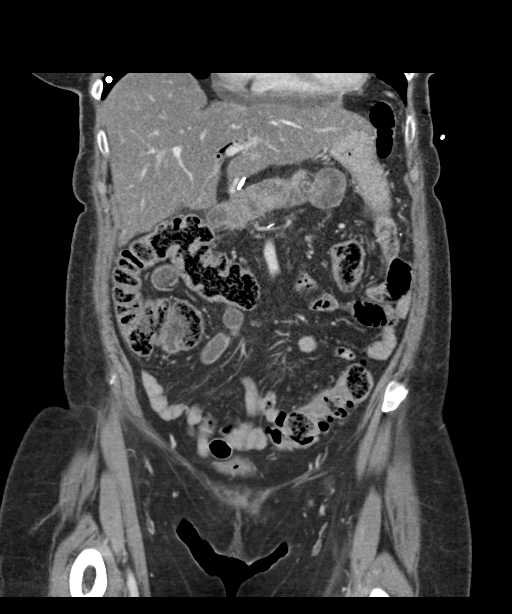
[im 34/77  soft-tissue]
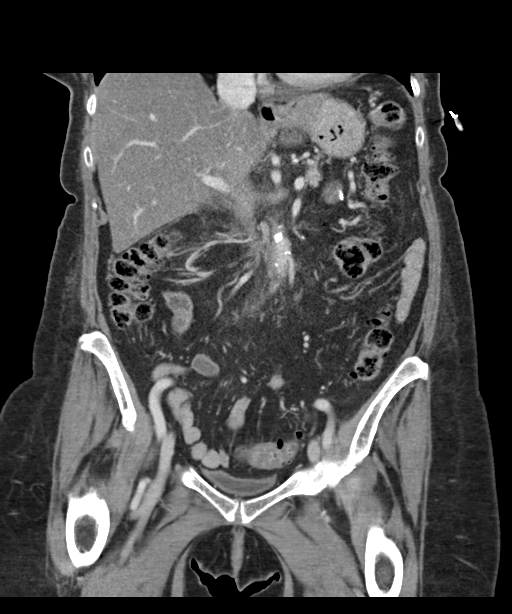
[im 43/77  soft-tissue]
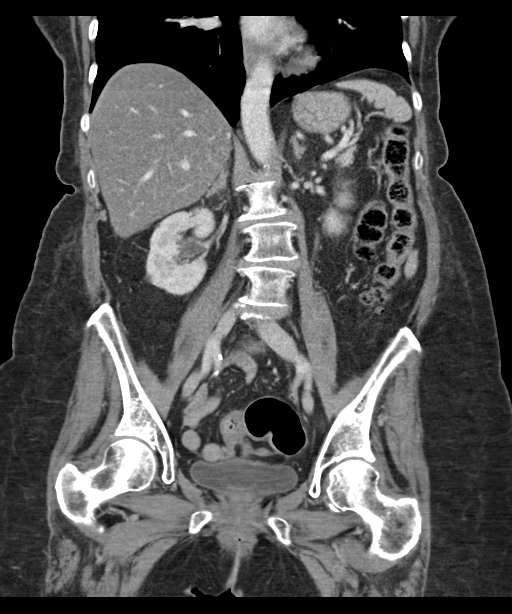

[14 of 46 positions shown; findings below may reference images not displayed]

RADIATION DOSE REDUCTION: This exam was performed according to the
departmental dose-optimization program which includes automated
exposure control, adjustment of the mA and/or kV according to
patient size and/or use of iterative reconstruction technique.

CONTRAST:  100mL OMNIPAQUE IOHEXOL 350 MG/ML SOLN
FINDINGS: CTA CHEST FINDINGS

Cardiovascular: Satisfactory opacification of the bilateral
pulmonary arteries to the segmental level. No evidence of pulmonary
embolism.

Although not tailored for evaluation of the thoracic aorta, there is
no evidence of thoracic aortic aneurysm or dissection.

Heart is normal in size. Small inferior pericardial fluid.

Mediastinum/Nodes: No suspicious mediastinal lymphadenopathy.

Visualized thyroid is unremarkable.

Lungs/Pleura: Lungs are clear.

No suspicious pulmonary nodules.

No focal consolidation.

No pleural effusion or pneumothorax.

Review of the MIP images confirms the above findings.

CT ABDOMEN and PELVIS FINDINGS

Hepatobiliary: Liver is notable for probable hepatic steatosis.

Status post cholecystectomy. Choledochojejunostomy. Mild
pneumobilia.

Pancreas: Proximal pancreatectomy with pancreaticoduodenostomy.

Spleen: Calcified splenic granulomata.

Adrenals/Urinary Tract: Adrenal glands are within normal limits.

11 mm right lower pole renal cyst (series 4/image 41). Left kidney
is within normal limits. No hydronephrosis.

Bladder is within normal limits.

Stomach/Bowel: Distal gastrectomy with gastrojejunostomy.

No evidence of bowel obstruction.

Normal appendix (series 4/image 55).

Left colonic diverticulosis, without evidence of diverticulitis.

Vascular/Lymphatic: No evidence of abdominal aortic aneurysm.

Atherosclerotic calcifications of the abdominal aorta and branch
vessels.

11 mm soft tissue nodule/node along the jejunal mesentery/right
para-aortic region (series 4/image 36), with adjacent stranding
(series 4/images 36 and 39). This is nonspecific on initial
postoperative evaluation, but warrants close attention on follow-up
to exclude residual/recurrent tumor.

Reproductive: Uterus is within normal limits.

Right ovary is within normal limits.  No left adnexal mass.

Other: No abdominopelvic ascites.

Postsurgical changes along the midline anterior abdominal wall with
1.5 x 3.5 cm seroma (series 4/image 30).

Musculoskeletal: Mild degenerative changes of the lumbar spine.

Review of the MIP images confirms the above findings.
IMPRESSION: No evidence of pulmonary embolism. No evidence of acute
cardiopulmonary disease.

Status post Whipple procedure, as described above.

11 mm soft tissue nodule/node along the jejunal mesentery, as
described above. This is nonspecific on initial postoperative
evaluation, but warrants close attention on follow-up to exclude
residual/recurrent tumor. Consider follow-up CT abdomen/pelvis or
PET-CT in 3 months.

## 2021-08-04 MED ORDER — IOHEXOL 350 MG/ML SOLN
100.0000 mL | Freq: Once | INTRAVENOUS | Status: AC | PRN
  Administered 2021-08-04: 100 mL via INTRAVENOUS

## 2021-08-04 MED ORDER — SODIUM CHLORIDE (PF) 0.9 % IJ SOLN
INTRAMUSCULAR | Status: AC
  Filled 2021-08-04: qty 50

## 2021-08-04 NOTE — ED Triage Notes (Signed)
Pt reports pain in her left breast. Pt reports recent surgery. Pt states doc sent her here for CT to rule out blood clots.

## 2021-08-04 NOTE — ED Notes (Signed)
Took pt to restroom. Pt had a bowel movement.

## 2021-08-04 NOTE — Discharge Instructions (Addendum)
Your laboratory results were within normal limits today.  You were provided with a copy of your CT angio of the did not show any pulmonary embolism.  You will need to follow-up with your oncologist at your next scheduled appointment.

## 2021-08-04 NOTE — ED Provider Notes (Signed)
[facility] DEPT Provider Note   CSN: [id number] Arrival date & time: 08/04/21  1643     History  Chief Complaint  Patient presents with   Breast Pain    Alvine Mostafa is a 76 y.o. female.  76 y.o female with a PMH of Pancreatic cancer s/p resection last month presents to the ED with a chief complaint of left breast pain x 4 days. Patient reports pain has been intermittent, sharp below her left breast.  Pain is exacerbated with any ambulation along with deep inspiration.  Patient recently recovered from Whipple procedure, had a second surgery due to infection.  Patient does report the pain is exacerbated with any type of walking.  She does use a walker to ambulate.  There is also exacerbation of pain with coughing.  In addition, she also endorses some nausea.  The prognosis for her pancreatic cancer treatment was for removal, will need to start chemo but has not done so yet.  She is currently on no blood thinners.  She did call oncologist who recommended she be evaluated in the ED for a possible blood clot.  No alleviating factors.  Patient without any fever, chest pain, leg swelling.   The history is provided by the patient and medical records.      Home Medications Prior to Admission medications   Medication Sig Start Date End Date Taking? Authorizing Provider  B Complex Vitamins (VITAMIN B COMPLEX PO) Take 1 capsule by mouth daily.    [provider]  ciprofloxacin (CIPRO) 500 MG tablet Take 500 mg by mouth 2 (two) times daily. 06/17/21   [provider]  diltiazem (DILACOR XR) 180 MG 24 hr capsule Take 180 mg by mouth at bedtime. 04/27/21   [provider]  enoxaparin (LOVENOX) 40 MG/0.4ML injection Inject 40 mg into the skin daily. 06/17/21   [provider]  HYDROmorphone (DILAUDID) 2 MG tablet Take 2 mg by mouth every 4 (four) hours as needed. Patient not taking: Reported on 06/21/2021 06/17/21   [provider]  hydrOXYzine (ATARAX) 25 MG tablet Take 25 mg by mouth every 8 (eight) hours as needed for anxiety or itching. Patient not taking: Reported on 06/21/2021 05/06/21   [provider]  melatonin 3 MG TABS tablet Take 3 mg by mouth at bedtime.    [provider]  metroNIDAZOLE (FLAGYL) 500 MG tablet Take 500 mg by mouth 2 (two) times daily. 06/17/21   [provider]  NON FORMULARY CPAP at bedtime    [provider]  olmesartan (BENICAR) 40 MG tablet Take 40 mg by mouth at bedtime. 03/17/20   [provider]  ondansetron (ZOFRAN) 8 MG tablet Take 1 tablet (8 mg total) by mouth every 8 (eight) hours as needed for nausea or vomiting. 05/20/21   Heilingoetter, Cassandra L, PA-C  Potassium Chloride ER 20 MEQ TBCR Take 20 mEq by mouth daily. 06/17/21   [provider]  traMADol (ULTRAM) 50 MG tablet Take 1 tablet (50 mg total) by mouth every 6 (six) hours as needed for severe pain. Patient not taking: Reported on 06/21/2021 05/17/21   Elgergawy, Silver Huguenin, MD  Vitamin D, Ergocalciferol, (DRISDOL) 1.25 MG (50000 UNIT) CAPS capsule Take 50,000 Units by mouth once a week. 05/28/21   [provider]      Allergies    Amlodipine besylate, Hydrochlorothiazide, Lexapro [escitalopram oxalate], Oxycodone, and Penicillins    Review of Systems   Review of Systems  Constitutional:  Negative for chills and fever.  HENT:  Negative for sore throat.   Respiratory:  Positive for shortness of breath.   Cardiovascular:  Negative for chest pain and leg swelling.  Gastrointestinal:  Negative for abdominal pain, nausea and vomiting.  Genitourinary:  Negative for flank pain.  Neurological:  Negative for light-headedness and headaches.  All other systems reviewed and are negative.  Physical Exam Updated Vital Signs BP (!) 150/72   Pulse 82   Temp 98 F (36.7 C) (Oral)   Resp 14   SpO2 96%  Physical Exam Vitals and nursing note reviewed.  Constitutional:       Appearance: Normal appearance. She is not ill-appearing or toxic-appearing.  HENT:     Head: Normocephalic and atraumatic.     Mouth/Throat:     Mouth: Mucous membranes are moist.  Eyes:     Pupils: Pupils are equal, round, and reactive to light.  Cardiovascular:     Rate and Rhythm: Normal rate.  Pulmonary:     Effort: Pulmonary effort is normal.     Breath sounds: No wheezing or rales.  Abdominal:     General: Abdomen is flat. A surgical scar is present. There is distension.     Palpations: Abdomen is soft.     Tenderness: There is no abdominal tenderness. There is no right CVA tenderness or left CVA tenderness.    Musculoskeletal:        General: Swelling present.     Cervical back: Normal range of motion and neck supple.  Skin:    General: Skin is warm and dry.  Neurological:     Mental Status: She is alert and oriented to person, place, and time.    ED Results / Procedures / Treatments   Labs (all labs ordered are listed, but only abnormal results are displayed) Labs Reviewed  CBC WITH DIFFERENTIAL/PLATELET - Abnormal; Notable for the following components:      Result Value   Hemoglobin 11.7 (*)    HCT 35.9 (*)    RDW 17.7 (*)    All other components within normal limits  COMPREHENSIVE METABOLIC PANEL - Abnormal; Notable for the following components:   Potassium 3.4 (*)    Glucose, Bld 126 (*)    Calcium 8.6 (*)    Albumin 3.0 (*)    All other components within normal limits  BRAIN NATRIURETIC PEPTIDE  LIPASE, BLOOD  TROPONIN I (HIGH SENSITIVITY)  TROPONIN I (HIGH SENSITIVITY)    EKG EKG Interpretation  Date/Time:  Wednesday Aug 04 2021 19:11:19 EDT Ventricular Rate:  79 PR Interval:  132 QRS Duration: 93 QT Interval:  395 QTC Calculation: 453 R Axis:   51 Text Interpretation: Sinus rhythm since last tracing no significant change Confirmed by Daleen Bo [phone] on 08/04/2021 7:55:23 PM  Radiology CT Angio Chest PE W and/or Wo Contrast  Result  Date: 08/04/2021 CLINICAL DATA:  Left upper abdominal/breast pain, recent surgery, evaluate for PE EXAM: CT ANGIOGRAPHY CHEST CT ABDOMEN AND PELVIS WITH CONTRAST TECHNIQUE: Multidetector CT imaging of the chest was performed using the standard protocol during bolus administration of intravenous contrast. Multiplanar CT image reconstructions and MIPs were obtained to evaluate the vascular anatomy. Multidetector CT imaging of the abdomen and pelvis was performed using the standard protocol during bolus administration of intravenous contrast. RADIATION DOSE REDUCTION: This exam was performed according to the departmental dose-optimization program which includes automated exposure control, adjustment of the mA and/or kV according to patient size and/or use of iterative  reconstruction technique. CONTRAST:  1105m OMNIPAQUE IOHEXOL 350 MG/ML SOLN COMPARISON:  CT abdomen/pelvis dated 06/21/2021 FINDINGS: CTA CHEST FINDINGS Cardiovascular: Satisfactory opacification of the bilateral pulmonary arteries to the segmental level. No evidence of pulmonary embolism. Although not tailored for evaluation of the thoracic aorta, there is no evidence of thoracic aortic aneurysm or dissection. Heart is normal in size. Small inferior pericardial fluid. Mediastinum/Nodes: No suspicious mediastinal lymphadenopathy. Visualized thyroid is unremarkable. Lungs/Pleura: Lungs are clear. No suspicious pulmonary nodules. No focal consolidation. No pleural effusion or pneumothorax. Review of the MIP images confirms the above findings. CT ABDOMEN and PELVIS FINDINGS Hepatobiliary: Liver is notable for probable hepatic steatosis. Status post cholecystectomy. Choledochojejunostomy. Mild pneumobilia. 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 Spleen: Calcified splenic granulomata. Adrenals/Urinary Tract: Adrenal glands are within normal limits. 11 mm right lower pole renal cyst (series 4/image 41). Left kidney is within normal  limits. No hydronephrosis. Bladder is within normal limits. Stomach/Bowel: Distal gastrectomy with gastrojejunostomy. No evidence of bowel obstruction. Normal appendix (series 4/image 55). Left colonic diverticulosis, without evidence of diverticulitis. Vascular/Lymphatic: No evidence of abdominal aortic aneurysm. Atherosclerotic calcifications of the abdominal aorta and branch vessels. 11 mm soft tissue nodule/node along the jejunal mesentery/right para-aortic region (series 4/image 36), with adjacent stranding (series 4/images 36 and 39). This is nonspecific on initial postoperative evaluation, but warrants close attention on follow-up to exclude residual/recurrent tumor. Reproductive: Uterus is within normal limits. Right ovary is within normal limits.  No left adnexal mass. Other: No abdominopelvic ascites. Postsurgical changes along the midline anterior abdominal wall with 1.5 x 3.5 cm seroma (series 4/image 30). Musculoskeletal: Mild degenerative changes of the lumbar spine. Review of the MIP images confirms the above findings. IMPRESSION: No evidence of pulmonary embolism. No evidence of acute cardiopulmonary disease. Status post Whipple procedure, as described above. 11 mm soft tissue nodule/node along the jejunal mesentery, as described above. This is nonspecific on initial postoperative evaluation, but warrants close attention on follow-up to exclude residual/recurrent tumor. Consider follow-up CT abdomen/pelvis or PET-CT in 3 months. Electronically Signed   By: SJulian HyM.D.   On: 08/04/2021 21:48   CT ABDOMEN PELVIS W CONTRAST  Result Date: 08/04/2021 CLINICAL DATA:  Left upper abdominal/breast pain, recent surgery, evaluate for PE EXAM: CT ANGIOGRAPHY CHEST CT ABDOMEN AND PELVIS WITH CONTRAST TECHNIQUE: Multidetector CT imaging of the chest was performed using the standard protocol during bolus administration of intravenous contrast. Multiplanar CT image reconstructions and MIPs were  obtained to evaluate the vascular anatomy. Multidetector CT imaging of the abdomen and pelvis was performed using the standard protocol during bolus administration of intravenous contrast. RADIATION DOSE REDUCTION: This exam was performed according to the departmental dose-optimization program which includes automated exposure control, adjustment of the mA and/or kV according to patient size and/or use of iterative reconstruction technique. CONTRAST:  1018mOMNIPAQUE IOHEXOL 350 MG/ML SOLN COMPARISON:  CT abdomen/pelvis dated 06/21/2021 FINDINGS: CTA CHEST FINDINGS Cardiovascular: Satisfactory opacification of the bilateral pulmonary arteries to the segmental level. No evidence of pulmonary embolism. Although not tailored for evaluation of the thoracic aorta, there is no evidence of thoracic aortic aneurysm or dissection. Heart is normal in size. Small inferior pericardial fluid. Mediastinum/Nodes: No suspicious mediastinal lymphadenopathy. Visualized thyroid is unremarkable. Lungs/Pleura: Lungs are clear. No suspicious pulmonary nodules. No focal consolidation. No pleural effusion or pneumothorax. Review of the MIP images confirms the above findings. CT ABDOMEN and PELVIS FINDINGS Hepatobiliary: Liver is notable for probable hepatic steatosis. Status post cholecystectomy. Choledochojejunostomy. Mild pneumobilia. Pancreas: Proximal  pancreatectomy with pancreaticoduodenostomy. Spleen: Calcified splenic granulomata. Adrenals/Urinary Tract: Adrenal glands are within normal limits. 11 mm right lower pole renal cyst (series 4/image 41). Left kidney is within normal limits. No hydronephrosis. Bladder is within normal limits. Stomach/Bowel: Distal gastrectomy with gastrojejunostomy. No evidence of bowel obstruction. Normal appendix (series 4/image 55). Left colonic diverticulosis, without evidence of diverticulitis. Vascular/Lymphatic: No evidence of abdominal aortic aneurysm. Atherosclerotic calcifications of the  abdominal aorta and branch vessels. 11 mm soft tissue nodule/node along the jejunal mesentery/right para-aortic region (series 4/image 36), with adjacent stranding (series 4/images 36 and 39). This is nonspecific on initial postoperative evaluation, but warrants close attention on follow-up to exclude residual/recurrent tumor. Reproductive: Uterus is within normal limits. Right ovary is within normal limits.  No left adnexal mass. Other: No abdominopelvic ascites. Postsurgical changes along the midline anterior abdominal wall with 1.5 x 3.5 cm seroma (series 4/image 30). Musculoskeletal: Mild degenerative changes of the lumbar spine. Review of the MIP images confirms the above findings. IMPRESSION: No evidence of pulmonary embolism. No evidence of acute cardiopulmonary disease. Status post Whipple procedure, as described above. 11 mm soft tissue nodule/node along the jejunal mesentery, as described above. This is nonspecific on initial postoperative evaluation, but warrants close attention on follow-up to exclude residual/recurrent tumor. Consider follow-up CT abdomen/pelvis or PET-CT in 3 months. Electronically Signed   By: Julian Hy M.D.   On: 08/04/2021 21:48   DG Chest Port 1 View  Result Date: 08/04/2021 CLINICAL DATA:  Left breast pain. Recent Whipple procedure for pancreatic cancer. EXAM: PORTABLE CHEST 1 VIEW COMPARISON:  05/16/2021 FINDINGS: Calcified granuloma at the right lung base. The lungs appear otherwise clear. Atherosclerotic calcification of the aortic arch. Heart size within normal limits. No significant bony abnormality identified. IMPRESSION: 1.  No active cardiopulmonary disease is radiographically apparent. 2.  Aortic Atherosclerosis (ICD10-I70.0). Electronically Signed   By: Van Clines M.D.   On: 08/04/2021 18:28    Procedures Procedures    Medications Ordered in ED Medications  iohexol (OMNIPAQUE) 350 MG/ML injection 100 mL (100 mLs Intravenous Contrast Given  08/04/21 2111)  sodium chloride (PF) 0.9 % injection (  Given 08/04/21 2152)    ED Course/ Medical Decision Making/ A&P                           Medical Decision Making Amount and/or Complexity of Data Reviewed Labs: ordered. Radiology: ordered.  Risk Prescription drug management.  This patient presents to the ED for concern of shortness of breath, breast pain, this involves a number of treatment options, and is a complaint that carries with it a high risk of complications and morbidity.  The differential diagnosis includes pulmonary embolism, ACS, pneumonia versus complications from procedure.    Co morbidities: Discussed in HPI   Brief History:  Patient here with recent Whipple procedure approximately 1 month ago, here with pain below the left breast exacerbated with any deep inspiration along with cough and ambulation.  Recommended by oncologist to be evaluated in the ED to rule out pulmonary embolism.  She is currently on no blood thinners, has not started any chemotherapy at this time.  She also endorses some nausea.  EMR reviewed including pt PMHx, past surgical history and past visits to ER.   See HPI for more details   Lab Tests:  I ordered and independently interpreted labs.  The pertinent results include:    Interpretation of labs by me, CBC white cell  count without any signs of leukocytosis.  Hemoglobin is slightly decreased, however improved from her baseline.  CMP without any significant electrolyte derangement  Levels within normal limits.  LFTs are unremarkable.  Lipase level is normal.  BNP is negative.  First troponin is also negative.   Imaging Studies:  NAD. I personally reviewed all imaging studies and no acute abnormality found. I agree with radiology interpretation. Due to patient's nonspecific chest pain, along with some shortness of breath will obtain CT angio chest. CT Angio chest: No evidence of pulmonary embolism. No evidence of acute   cardiopulmonary disease.     Status post Whipple procedure, as described above.     11 mm soft tissue nodule/node along the jejunal mesentery, as  described above. This is nonspecific on initial postoperative  evaluation, but warrants close attention on follow-up to exclude  residual/recurrent tumor. Consider follow-up CT abdomen/pelvis or  PET-CT in 3 months.      These results were discussed at length with patient, she was also provided with a copy of her CT results for her records.  I did discuss close follow-up after 11 mm soft tissue nodule that was found.  Cardiac Monitoring:  The patient was maintained on a cardiac monitor.  I personally viewed and interpreted the cardiac monitored which showed an underlying rhythm of: NSR 79 EKG non-ischemic   Medicines ordered:  N/A Reevaluation:  After the interventions noted above I re-evaluated patient and found that they have :stayed the same   Social Determinants of Health:  The patient's social determinants of health were a factor in the care of this patient    Problem List / ED Course:  Patient here status post pancreatic resection for pancreatic carcinoma with some left under her breast pain.  Has also felt more short of breath in the last couple of days exacerbated with any type of ambulation.  No prior history of blood clots, currently on no blood thinners.  Labs have been remarkably normal, troponin level is negative, will obtain delta.  Her EKG is nonischemic without any significant changes.  She is hemodynamically stable with no hypoxia or tachycardia.  However in the setting of cancer will obtain CT angio chest to rule out pulmonary embolism.   Dispostion:  After consideration of the diagnostic results and the patients response to treatment, I feel that the patent would benefit from outpatient follow-up with her oncologist.  Patient pending delta troponin if negative reassuring ACS work-up will be appropriate for  discharge.  Her care has been signed out to Dr. Eulis Foster who will follow for troponin.    Portions of this note were generated with [employer]. Dictation errors may occur despite best attempts at proofreading.   Final Clinical Impression(s) / ED Diagnoses Final diagnoses:  Breast pain, left  Shortness of breath    Rx / DC Orders ED Discharge Orders     None         Janeece Fitting, Hershal Coria 08/04/21 2204    Daleen Bo, MD 08/05/21 0002

## 2021-08-04 NOTE — ED Provider Notes (Signed)
  Face-to-face evaluation   History: Patient presents for evaluation of left anterior chest pain, persistent for couple days without known trauma.  She is recovering from Whipple procedure, done last month.  Has been eating well and feels like her ongoing wound of her abdomen is improving.  She states her pain in her abdomen is much less.  She did have swelling both legs but that has resolved as well as the discomfort and swelling in her abdomen has improved.  She denies persistent shortness of breath, fever, chills, nausea or vomiting.  She has been ambulating in her house using an assistive device.  Physical exam: Elderly, alert and cooperative.  Nontoxic appearance.  No respiratory distress.  No stridor.  Lower legs without edema or localized tenderness bilaterally.  MDM: Evaluation for  Chief Complaint  Patient presents with   Breast Pain     Nonspecific chest pain, low level concern for PE.  No evidence clinically for pneumonia.  Doubt heart failure.  Abdominal wound seems to be healing well.  She has a bandage on the wound when I saw her but showed me a picture from yesterday that indicates a healing mildly dehisced lower mid abdominal wound.  Medical screening examination/treatment/procedure(s) were conducted as a shared visit with non-physician practitioner(s) and myself.  I personally evaluated the patient during the encounter    Daleen Bo, MD 08/05/21 0002

## 2021-08-05 ENCOUNTER — Telehealth: Payer: Self-pay | Admitting: *Deleted

## 2021-08-05 NOTE — Telephone Encounter (Signed)
Call from Pincus Large, NP with Dr. Shon Hough at [facility] requesting new patient appointment with Dr. Benay Spice for pancreas cancer with no residual disease after Whipple on 06/22/21. She will send pathology report (has not had any molecular testing yet) and some records. Also had CT at [facility] yesterday to R/O PE due to some pleuritic pain and was negative. Forwarded referral to nurse navigator.

## 2021-08-09 ENCOUNTER — Inpatient Hospital Stay: Payer: Medicare Other | Attending: Physician Assistant | Admitting: Oncology

## 2021-08-09 ENCOUNTER — Encounter: Payer: Self-pay | Admitting: *Deleted

## 2021-08-09 ENCOUNTER — Other Ambulatory Visit: Payer: Self-pay | Admitting: *Deleted

## 2021-08-09 VITALS — BP 132/73 | HR 89 | Temp 98.1°F | Resp 18 | Ht 60.0 in | Wt 123.8 lb

## 2021-08-09 DIAGNOSIS — C7989 Secondary malignant neoplasm of other specified sites: Secondary | ICD-10-CM | POA: Diagnosis not present

## 2021-08-09 DIAGNOSIS — Z5111 Encounter for antineoplastic chemotherapy: Secondary | ICD-10-CM | POA: Insufficient documentation

## 2021-08-09 DIAGNOSIS — C779 Secondary and unspecified malignant neoplasm of lymph node, unspecified: Secondary | ICD-10-CM | POA: Insufficient documentation

## 2021-08-09 DIAGNOSIS — C250 Malignant neoplasm of head of pancreas: Secondary | ICD-10-CM | POA: Insufficient documentation

## 2021-08-09 NOTE — Progress Notes (Unsigned)
Tonya Jenkins Consult   Requesting MD: [name], [address] [place],  [place] [address]   Tonya Jenkins 76 y.o.  1945-11-06    Reason for Consult: Pancreas cancer   HPI: Tonya Jenkins presented to emergency room 05/12/2021 with pruritus and jaundice.  A CT the abdomen pelvis revealed no focal liver abnormality.  Intrahepatic biliary dilatation was noted.  The gallbladder was distended and the common bile duct was dilated with abrupt narrowing at the level of the pancreas head.  The pancreas duct was dilated with narrowing in the pancreas head.  No underlying mass lesion was appreciated.  No enlarged abdominal pelvic lymph nodes.  No ascites.  An MRI/MRCP on 05/12/2021 revealed a poorly defined pancreas mass measuring 3.6 x 2.1 x 4.2 cm.  This obstructed the distal pancreas duct and common bile duct.  She underwent an ERCP by Dr. Benson Norway on 05/13/2021.  The bile duct could not be cannulated.  Interventional urology placed an internal/external biliary drain on 05/13/2021.  An endoscopic ultrasound by Dr. Almyra Free on 05/14/2021 revealed a 16 x 12 mm mass in the pancreas head was confirmed.  FNA biopsy was performed.  The cytology revealed malignant cells consistent with adenocarcinoma. A CT chest 05/14/2021 revealed no evidence of metastatic disease.  There was a small right pleural effusion. She had a thoracentesis of a right pleural effusion 05/16/2021.  The cytology was negative.   Tonya Jenkins was referred to Dr. Zenia Resides at [facility].  And was taken to a pancreaticoduodenectomy on 06/22/2021.  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  The resection margins are negative.  3 of 36 lymph nodes contained metastatic disease.  Lymphovascular and perineural invasion were present.  The tumor was staged as apT2pN2 pancreas cancer.  She developed a wound infection and was taken back to the operating room for  fascial dehiscence on 07/02/2021.  This was managed with primary fascial closure and placement of a wound VAC.  She was discharged home on 07/12/2021.  She was seen in the emergency room with breast pain on 08/04/2021 with chest pain.  CTs of the chest, abdomen, and pelvis revealed no evidence of pulmonary embolism.  An 11 mm soft tissue nodule was noted at the jejunal mesentery-nonspecific.  She is living at home.  She has a caretaker.  Tonya Jenkins is referred to consider adjuvant treatment options.  She continues to feel weak.  The abdominal wound is healing.  She changes the dressing daily.   Past Medical History:  Diagnosis Date   History of hip surgery    Hx of neck surgery    Hypertension     .  G0, P0   .  "Borderline diabetes "for 2 years   .   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 leg and foot following a fall   Past Surgical History:  Procedure Laterality Date   CERVICAL SPINE SURGERY     ERCP N/A 05/13/2021   Procedure: ENDOSCOPIC RETROGRADE CHOLANGIOPANCREATOGRAPHY (ERCP);  Surgeon: Carol Ada, MD;  Location: [facility];  Service: Gastroenterology;  Laterality: N/A;   ESOPHAGOGASTRODUODENOSCOPY (EGD) WITH PROPOFOL N/A 05/14/2021   Procedure: ESOPHAGOGASTRODUODENOSCOPY (EGD) WITH PROPOFOL;  Surgeon: Carol Ada, MD;  Location: [facility];  Service: Gastroenterology;  Laterality: N/A;   EUS Left 05/14/2021   Procedure: UPPER ENDOSCOPIC ULTRASOUND (EUS) LINEAR;  Surgeon: Carol Ada, MD;  Location: [facility];  Service: Gastroenterology;  Laterality: Left;   FINE  NEEDLE ASPIRATION  05/14/2021   Procedure: FINE NEEDLE ASPIRATION (FNA) LINEAR;  Surgeon: Carol Ada, MD;  Location: [facility];  Service: Gastroenterology;;   HAMMER TOE SURGERY     HIP SURGERY     IR INT EXT BILIARY DRAIN WITH CHOLANGIOGRAM  05/13/2021   IR Jenkins EVAL TECH 0-60 MINS  05/20/2021   KNEE SURGERY      Medications: Reviewed  Allergies:  Allergies  Allergen Reactions   Amlodipine Besylate      Other reaction(s): severe HA   Hydrochlorothiazide     Other reaction(s): hallucinations   Lexapro [Escitalopram Oxalate]     Pt stated, "It gave me hallucinations"   Oxycodone Other (See Comments)    "I go crazy"   Penicillins Other (See Comments)    Family history: Her brother died of colon cancer.  No other family history of cancer.  Social History:   She lives alone in [place].  She is retired from [employer] and [place] [employer].  She does not use cigarettes or alcohol.  No transfusion history.  No risk factor for HIV or hepatitis.  ROS:   Positives include: Jaundice/pruritus prior to the biliary stent-resolved, generalized weakness, right lower leg/foot numbness, frequent small-volume loose bowel movements  A complete ROS was otherwise negative.  Physical Exam:  Blood pressure 132/73, pulse 89, temperature 98.1 F (36.7 C), temperature source Oral, resp. rate 18, height 5' (1.524 m), weight 123 lb 12.8 oz (56.2 kg), SpO2 100 %.  HEENT: Oral cavity without visible mass, neck without mass Lungs: Clear bilaterally Cardiac: Regular rate and rhythm Abdomen: No hepatosplenomegaly, no mass, nontender, no apparent ascites  Vascular: No leg edema Lymph nodes: No cervical, supraclavicular, axillary, or inguinal nodes Neurologic: Alert and oriented, motor exam appears intact in the upper and lower extremities bilaterally Skin: No rash, superficial opening with serous discharge at the lower aspect of the midline abdominal wound Musculoskeletal: No spine tenderness   LAB:  CBC  Lab Results  Component Value Date   WBC 7.0 08/04/2021   HGB 11.7 (L) 08/04/2021   HCT 35.9 (L) 08/04/2021   MCV 90.4 08/04/2021   PLT 380 08/04/2021   NEUTROABS 4.2 08/04/2021        CMP  Lab Results  Component Value Date   NA 139 08/04/2021   K 3.4 (L) 08/04/2021   CL 103 08/04/2021   CO2 27 08/04/2021   GLUCOSE 126 (H) 08/04/2021   BUN 13 08/04/2021   CREATININE 0.86  08/04/2021   CALCIUM 8.6 (L) 08/04/2021   PROT 6.7 08/04/2021   ALBUMIN 3.0 (L) 08/04/2021   AST 28 08/04/2021   ALT 24 08/04/2021   ALKPHOS 98 08/04/2021   BILITOT 0.6 08/04/2021   GFRNONAA >60 08/04/2021     Lab Results  Component Value Date   CAN199 <2 05/12/2021    Imaging: As per HPI    Assessment/Plan:   Pancreas cancer, stage III (pT2pN2) CT abdomen/pelvis 05/12/2021-dilated intrahepatic and [place] bile ducts, abrupt narrowing of the common bile duct and pancreatic duct at the level of pancreas head, distended gallbladder MRI/MRCP 05/13/2018 23-3.6 x 2.1 x 4.2 cm enhancing mass in the head of the pancreas with obstruction of the pancreatic duct and common bile duct, no lymphadenopathy, no liver lesions Internal/external biliary drain 05/14/2021 CT chest 05/14/2021-small right pleural effusion, no evidence of metastatic disease Thoracentesis 05/16/2021-negative cytology Pancreaticoduodenectomy at [facility] 06/22/2021-no evidence of metastatic disease, inflamed gallbladder with percutaneous biliary drain, large pancreas head mass-gallbladder with acute cholecystitis  and abscess formation negative for malignancy.  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, negative resection margins, 6/36 lymph nodes with metastatic adenocarcinoma, lymphovascular and perineural invasion CT angio chest and CT abdomen/pelvis 08/04/2021-negative for pulmonary embolism, status post Whipple procedure, 11 mm soft tissue nodule along the jejunal mesentery-nonspecific Postoperative wound dehiscence-status post fascial closure and placement of a wound VAC 07/02/2021 Chronic numbness of the right lower leg and foot Hypertension   Disposition:   Ms. Aplin has been diagnosed with stage III pancreas cancer.  She is recovering from a pancreaticoduodenectomy.  The midline abdominal wound has almost completely healed.  She continues to have a borderline performance status to receive adjuvant  therapy.  She spends a large part of the day in bed.  Ms. Stegall has a high risk of developing recurrent pancreas cancer over the next several years.  I discussed data supporting the administration of adjuvant systemic chemotherapy in this setting.  We discussed FOLFIRINOX, gemcitabine/capecitabine, and gemcitabine/nab-paclitaxel.  She does not appear to be a candidate for FOLFIRINOX.  I think it would be difficult for her to tolerate capecitabine with the current diarrhea.  I recommend adjuvant gemcitabine/nab-paclitaxel.  We reviewed potential toxicities associated with this regimen including the chance of nausea, alopecia, and hematologic toxicity.  We discussed the fever, pneumonitis, and rash associated with gemcitabine.  We discussed the allergic reaction and neuropathy seen with nab-paclitaxel.  She agrees to proceed.  She will be referred for Port-A-Cath placement.  She will return for reassessment with a plan to begin adjuvant therapy on 08/19/2021.  She will continue home physical therapy.  We will make referral to the physical therapy program here when she has completed home physical therapy.  We will refer her to the Cancer center nutritionist.  Betsy Coder, MD  08/09/2021, 3:00 PM

## 2021-08-09 NOTE — Progress Notes (Unsigned)
PATIENT NAVIGATOR PROGRESS NOTE  Name: Madason Rauls Date: 08/09/2021 MRN: [id number]  DOB: March 16, 1945   Reason for visit:  Initial visit with Dr Benay Spice  Comments:  Met with Ms Delancey during visit with Dr Benay Spice Patient lives alone and needs transportation assistance. Waiver and enrollment form signed and emailed to [employer], transportation coordinator Referrals placed for SW, nutrition as well Pt currently has PT at home but would like to transition to outpatient PT here at [facility] with possible water therapy as well PAC education provided and demonstrated and pt to have PAC placed at [facility] on 6/12. Instructions written and given to patient Written education material given for Gemzar and Abraxane therapy which will begin end of next week Contact information to clinice and Nurse Navigator provided to patient    Encouraged patient to call with any issues or questions    Time spent counseling/coordinating care: > 60 minutes

## 2021-08-09 NOTE — Progress Notes (Signed)
START OFF PATHWAY REGIMEN - Pancreatic Adenocarcinoma   [id number]:Gemcitabine 1,000 mg/m2 IV D1,8,15 + Nab-Paclitaxel 125 mg/m2 IV D1,8,15 q28 Days:   A cycle is every 28 days:     Nab-paclitaxel (protein bound)      Gemcitabine   **Always confirm dose/schedule in your pharmacy ordering system**  Patient Characteristics: Postoperative without Neoadjuvant Therapy, M0 (Pathologic Staging), Negative Margins (Includes Positive Lymph Nodes) Therapeutic Status: Postoperative without Neoadjuvant Therapy, M0 (Pathologic Staging) AJCC T Category: pT2 AJCC N Category: pN2 AJCC M Category: cM0 AJCC 8 Stage Grouping: III Intent of Therapy: Curative Intent, Discussed with Patient

## 2021-08-10 ENCOUNTER — Inpatient Hospital Stay: Payer: Medicare Other | Admitting: Licensed Clinical Social Worker

## 2021-08-10 ENCOUNTER — Encounter: Payer: Self-pay | Admitting: Licensed Clinical Social Worker

## 2021-08-10 ENCOUNTER — Encounter: Payer: Self-pay | Admitting: Oncology

## 2021-08-10 ENCOUNTER — Encounter: Payer: Self-pay | Admitting: *Deleted

## 2021-08-10 DIAGNOSIS — C250 Malignant neoplasm of head of pancreas: Secondary | ICD-10-CM

## 2021-08-10 NOTE — Progress Notes (Signed)
[place] Work  Initial Assessment   Tonya Jenkins is a 76 y.o. year old female contacted by phone. Clinical Social Work was referred by nurse navigator for assessment of psychosocial needs.   SDOH (Social Determinants of Health) assessments performed: Yes SDOH Interventions    Flowsheet Row Most Recent Value  SDOH Interventions   Food Insecurity Interventions Intervention Not Indicated  Financial Strain Interventions Intervention Not Indicated  Housing Interventions Intervention Not Indicated  Physical Activity Interventions Intervention Not Indicated  Stress Interventions Intervention Not Indicated, Provide Counseling  Social Connections Interventions Intervention Not Indicated  Transportation Interventions Patient Resources (Friends/Family), Intervention Not Indicated  Depression Interventions/Treatment  Counseling       SDOH Screenings   Alcohol Screen: Low Risk    Last Alcohol Screening Score (AUDIT): 0  Depression (PHQ2-9): Medium Risk   PHQ-2 Score: 7  Financial Resource Strain: Low Risk    Difficulty of Paying Living Expenses: Not very hard  Food Insecurity: No Food Insecurity   Worried About Charity fundraiser in the Last Year: Never true   Ran Out of Food in the Last Year: Never true  Housing: Low Risk    Last Housing Risk Score: 0  Physical Activity: Inactive   Days of Exercise per Week: 0 days   Minutes of Exercise per Session: 0 min  Social Connections: Moderately Isolated   Frequency of Communication with Friends and Family: Three times a week   Frequency of Social Gatherings with Friends and Family: Three times a week   Attends Religious Services: 1 to 4 times per year   Active Member of Clubs or Organizations: No   Attends Archivist Meetings: Never   Marital Status: Divorced  Stress: Stress Concern Present   Feeling of Stress : Rather much  Tobacco Use: Low Risk    Smoking Tobacco Use: Never   Smokeless Tobacco Use: Never    Passive Exposure: Not on file  Transportation Needs: No Transportation Needs   Lack of Transportation (Medical): No   Lack of Transportation (Non-Medical): No     Distress Screen completed: Yes    08/09/2021    2:09 PM  ONCBCN DISTRESS SCREENING  Screening Type Initial Screening  Practical problem type Transportation  Physician notified of physical symptoms No  Referral to clinical psychology No  Referral to clinical social work No  Referral to dietition No  Referral to financial advocate No  Referral to support programs No  Referral to palliative care No      Family/Social Information:  Housing Arrangement: patient lives [place] private care giver who is assisting Tonya Jenkins Family members/support persons in your life? Friends, Education administrator, Social research officer, government, and Development worker, community concerns: no  Employment: Retired .[employer]  Income source: Engineer, agricultural concerns: Yes, current concerns Type of concern: None Food access concerns: no Religious or spiritual practice: Not known Services Currently in place:  Medicare A&B, BCBS PPO and private care giver  Coping/ Adjustment to diagnosis: Patient understands treatment plan and what happens next? yes Concerns about diagnosis and/or treatment: Pain or discomfort during procedures, Feelings of anger or sadness, Overwhelmed by information, Afraid of cancer, How I will pay for the services I need, How will I care for myself, and Quality of life Patient reported stressors: Finances, Depression, Anxiety/ nervousness, Adjusting to my illness, Feeling hopeless, Facing my mortality, and Physical issues Hopes and/or priorities: N/A Patient enjoys reading and time with family/ friends Current coping skills/ strengths: Ability for insight ,  Capable of independent living , Communication skills , Scientist, research (life sciences) , Motivation for treatment/growth , and Supportive family/friends     SUMMARY: Current SDOH  Barriers:  Financial constraints related to fixed income, Limited social support, and Level of care concerns  Clinical Social Work Clinical Goal(s):  Patient will work with SW to address concerns related to anxiety, diagnosis and treatment adjustment, relationship with caregiver.  Interventions: Discussed common feeling and emotions when being diagnosed with cancer, and the importance of support during treatment Informed patient of the support team roles and support services at [facility] Provided CSW contact information and encouraged patient to call with any questions or concerns Referred patient to Designer, jewellery and Provided patient with information about CSW role in patient care and available resources.   Follow Up Plan: CSW will see patient on Wednesday 08/11/2021, via MyChart Patient verbalizes understanding of plan: Yes  Patient stated she is experiencing high levels of anxiety and would like to discuss symptom management and skills to assist with adjustment to diagnosis and treatment side effects.  Patient would like for caregiver to join the session, to discuss communication and boundaries, and well as self-care.   Adelene Amas, LCSW

## 2021-08-10 NOTE — Progress Notes (Signed)
[place] Work  Clinical Social Work was referred by Art therapist for assessment of psychosocial needs.  Clinical Social Worker contacted patient by phone  to offer support and assess for needs.  CSW spoke with care giver who stated the patient feeling nauseous and didn't want to talk. CSW will contact patient either later today or tomorrow, care giver verbalized understanding.      Adelene Amas, [place] Worker [facility]

## 2021-08-11 ENCOUNTER — Inpatient Hospital Stay: Payer: Medicare Other | Admitting: Licensed Clinical Social Worker

## 2021-08-11 DIAGNOSIS — C250 Malignant neoplasm of head of pancreas: Secondary | ICD-10-CM

## 2021-08-11 NOTE — Progress Notes (Signed)
[place] CSW Progress Note  Clinical Education officer, museum contacted patient by phone to discuss missed virtual appointment.  Patient stated she was unsure of the virtual appointment and thought CSW would contact patient by phone.  CSW recommended to reschedule appointment and patient agreed.  Appointment rescheduled for Friday 08/13/2021 @ 2:30PM.  CSW reminded patient how to connect to the virtual appointment via My Chart, and gave patient contact information to call CSW of there were any issues. Patient verbalized understanding.    Adelene Amas, LCSW

## 2021-08-13 ENCOUNTER — Inpatient Hospital Stay: Payer: Medicare Other | Admitting: Licensed Clinical Social Worker

## 2021-08-13 ENCOUNTER — Other Ambulatory Visit: Payer: Self-pay | Admitting: Student

## 2021-08-13 DIAGNOSIS — C250 Malignant neoplasm of head of pancreas: Secondary | ICD-10-CM

## 2021-08-13 NOTE — Progress Notes (Addendum)
[place] CSW Progress Note  Clinical Education officer, museum  met in virtual session with patient and caregiver  to discuss communication, caregiver burnout and adjustment to treatment/diagnosis concerns.  Patient expressed concern caregiver might experience burnout and patient would like to find ways to avoid this.  CSW asked caregiver what her concerns were and how she would identify if she was feeing burn out.  Care giver stated at this moment she was not experiencing any concerns over burn out and she was happy to assist patient .  CSW encouraged patient and caregiver to communicate using "I statements", and specific statements, explaining the mor specific we were with our communication the better it would be for the other person to understand what we would like to convey.  CSW, patient and caregiver spoke about boundary setting, making plans for safety if th caregiver need to be away from the patient for any length of time and other support persons that can be contacted.  CSW and patient spoke at length about adjusting to diagnosis and treatment, living in the moment, trying to reduce expectation and anticipation and using CBT skills to change thoughts process to reflect the choice making ability the patient still has within this situation.  Patient stated she receives her first chemo treatment next  week and she would contact CSW if she felt she needed to talk again.  CSW agreed and gave patient contact information.  CSW gave patient information on [facility] Directive Clinics at [facility] and emailed patient a copy of June Calendar to email [email or web address] .Dianah Field, LCSW

## 2021-08-15 ENCOUNTER — Other Ambulatory Visit: Payer: Self-pay | Admitting: Oncology

## 2021-08-16 ENCOUNTER — Ambulatory Visit (HOSPITAL_COMMUNITY)
Admission: RE | Admit: 2021-08-16 | Discharge: 2021-08-16 | Disposition: A | Discharge status: Home / Self Care | Payer: Medicare Other | Source: Ambulatory Visit | Attending: Oncology | Admitting: Oncology

## 2021-08-16 ENCOUNTER — Encounter (HOSPITAL_COMMUNITY): Payer: Self-pay

## 2021-08-16 ENCOUNTER — Other Ambulatory Visit: Payer: Self-pay

## 2021-08-16 DIAGNOSIS — C250 Malignant neoplasm of head of pancreas: Secondary | ICD-10-CM | POA: Diagnosis present

## 2021-08-16 HISTORY — PX: IR IMAGING GUIDED PORT INSERTION: IMG5740

## 2021-08-16 IMAGING — US IR IMAGING GUIDED PORT INSERTION
1 series · 1 of 1 positions shown · non-contrast
Comparison: none

INDICATION: 75-year-old female with metastatic pancreatic adenocarcinoma. She
presents for port catheter placement.

[Series 1: ir fluoro/shunt/fist · 1 of 1 slices shown]
[im 1/1]
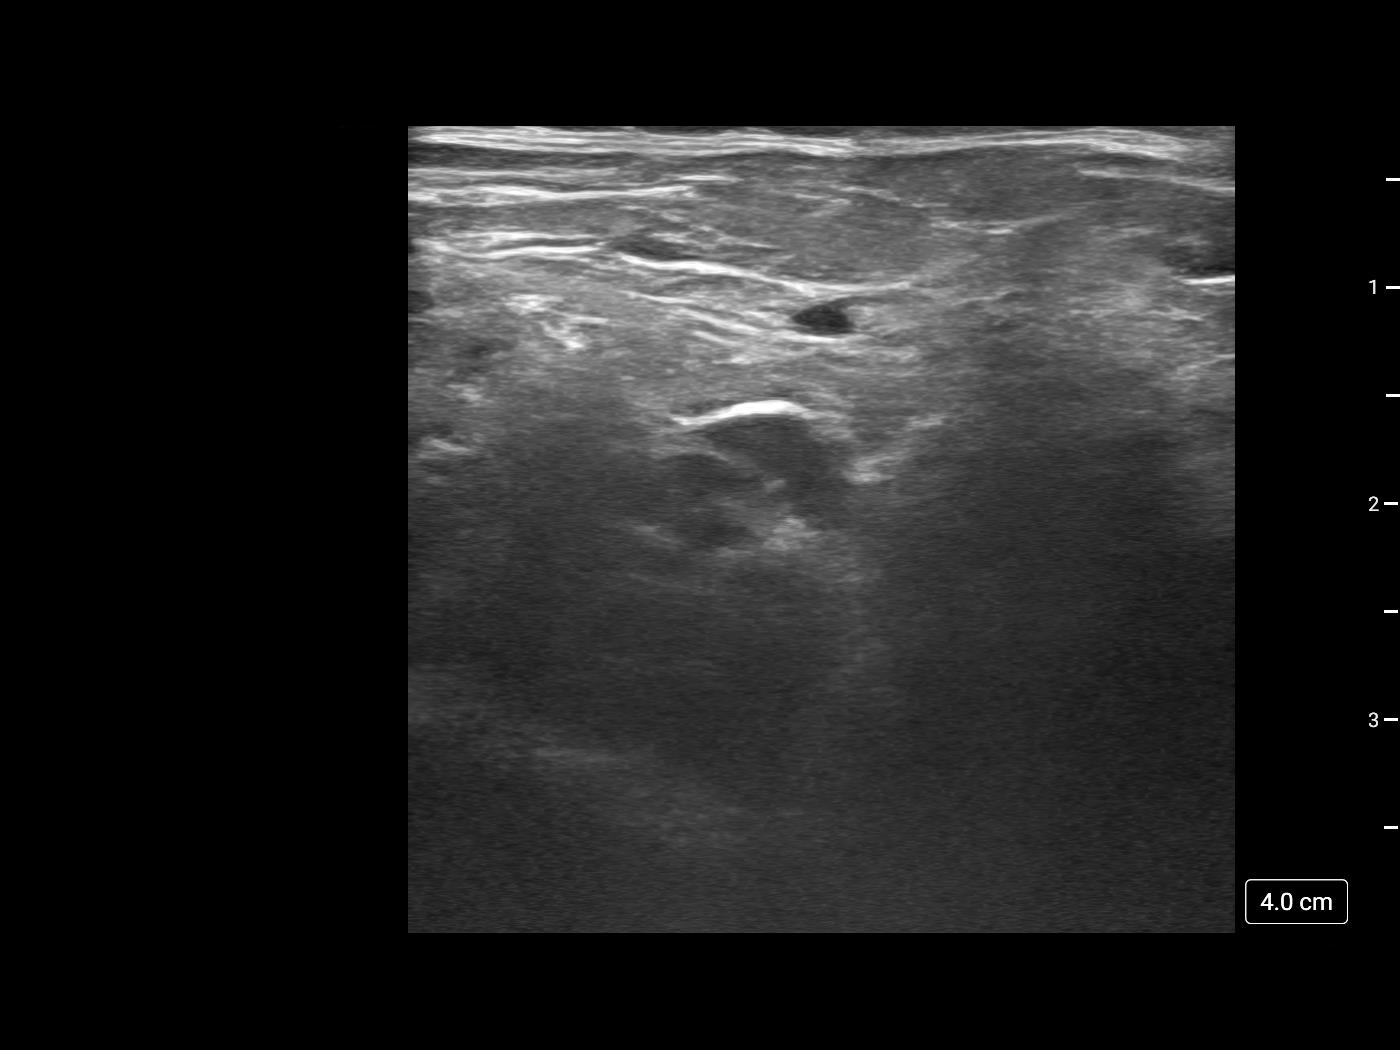

[1 of 1 positions shown; findings below may reference images not displayed]

EXAM:
IMPLANTED PORT A CATH PLACEMENT WITH ULTRASOUND AND FLUOROSCOPIC
GUIDANCE

MEDICATIONS:
None.

ANESTHESIA/SEDATION:
Versed 2.5 mg IV; Fentanyl 100 mcg IV;

Moderate Sedation Time:  20 minutes

The patient's vital signs and level of consciousness were
continuously monitored during the procedure by the interventional
radiology nurse under my direct supervision.

FLUOROSCOPY:
Less than 1 mGy

COMPLICATIONS:
None immediate.

PROCEDURE:
The right neck and chest was prepped with chlorhexidine, and draped
in the usual sterile fashion using maximum barrier technique (cap
and mask, sterile gown, sterile gloves, large sterile sheet, hand
hygiene and cutaneous antiseptic). Local anesthesia was attained by
infiltration with 1% lidocaine with epinephrine.

Ultrasound demonstrated patency of the right internal jugular vein,
and this was documented with an image. Under real-time ultrasound
guidance, this vein was accessed with a 21 gauge micropuncture
needle and image documentation was performed. A small dermatotomy
was made at the access site with an 11 scalpel. A 0.018" wire was
advanced into the SVC and the access needle exchanged for a 4F
micropuncture vascular sheath. The 0.018" wire was then removed and
a 0.035" wire advanced into the IVC.



The venous access site was then serially dilated and a peel away
vascular sheath placed over the wire. The wire was removed and the
port catheter advanced into position under fluoroscopic guidance.
The catheter tip is positioned in the superior cavoatrial junction.
This was documented with a spot image. The portacatheter was then
tested and found to flush and aspirate well. The port was flushed
with saline followed by 100 units/mL heparinized saline.

The pocket was then closed in two layers using first subdermal
inverted interrupted absorbable sutures followed by a running
subcuticular suture. The epidermis was then sealed with Dermabond.
The dermatotomy at the venous access site was also closed with
Dermabond.
IMPRESSION: Successful placement of a right IJ approach Power Port with
ultrasound and fluoroscopic guidance. The catheter is ready for use.

## 2021-08-16 MED ORDER — MIDAZOLAM HCL 2 MG/2ML IJ SOLN
INTRAMUSCULAR | Status: AC | PRN
  Administered 2021-08-16: 1 mg via INTRAVENOUS

## 2021-08-16 MED ORDER — LIDOCAINE-EPINEPHRINE 1 %-1:100000 IJ SOLN
INTRAMUSCULAR | Status: AC | PRN
  Administered 2021-08-16: 10 mL via INTRADERMAL

## 2021-08-16 MED ORDER — MIDAZOLAM HCL 2 MG/2ML IJ SOLN
INTRAMUSCULAR | Status: AC | PRN
  Administered 2021-08-16: .5 mg via INTRAVENOUS

## 2021-08-16 MED ORDER — FENTANYL CITRATE (PF) 100 MCG/2ML IJ SOLN
INTRAMUSCULAR | Status: AC | PRN
  Administered 2021-08-16: 50 ug via INTRAVENOUS

## 2021-08-16 MED ORDER — SODIUM CHLORIDE 0.9 % IV SOLN: INTRAVENOUS | Status: DC

## 2021-08-16 MED ORDER — HEPARIN SOD (PORK) LOCK FLUSH 100 UNIT/ML IV SOLN
INTRAVENOUS | Status: AC | PRN
  Administered 2021-08-16: 500 [IU] via INTRAVENOUS

## 2021-08-16 MED ORDER — LIDOCAINE-EPINEPHRINE 1 %-1:100000 IJ SOLN
INTRAMUSCULAR | Status: AC
  Filled 2021-08-16: qty 1

## 2021-08-16 MED ORDER — HEPARIN SOD (PORK) LOCK FLUSH 100 UNIT/ML IV SOLN
INTRAVENOUS | Status: AC
  Filled 2021-08-16: qty 5

## 2021-08-16 MED ORDER — FENTANYL CITRATE (PF) 100 MCG/2ML IJ SOLN
INTRAMUSCULAR | Status: AC
  Filled 2021-08-16: qty 2

## 2021-08-16 MED ORDER — MIDAZOLAM HCL 2 MG/2ML IJ SOLN
INTRAMUSCULAR | Status: AC
  Filled 2021-08-16: qty 4

## 2021-08-16 NOTE — Discharge Instructions (Signed)
For questions /concerns may call Interventional Radiology at [phone] ° °You may remove your dressing and shower tomorrow afternoon ° °DO NOT use EMLA cream for 2 weeks after port placement as the cream will remove surgical glue on your incision.    ° ° °Implanted Port Insertion, Care After °This sheet gives you information about how to care for yourself after your procedure. Your health care provider may also give you more specific instructions. If you have problems or questions, contact your health careprovider. °What can I expect after the procedure? °After the procedure, it is common to have: °Discomfort at the port insertion site. °Bruising on the skin over the port. This should improve over 3-4 days. °Follow these instructions at home: °Port care °After your port is placed, you will get a manufacturer's information card. The card has information about your port. Keep this card with you at all times. °Take care of the port as told by your health care provider. Ask your health care provider if you or a family member can get training for taking care of the port at home. A home health care nurse may also take care of the port. °Make sure to remember what type of port you have. °Incision care °Follow instructions from your health care provider about how to take care of your port insertion site. Make sure you: °Wash your hands with soap and water before and after you change your bandage (dressing). If soap and water are not available, use hand sanitizer. °Change your dressing as told by your health care provider. °Leave skin glue, or adhesive strips in place. These skin closures may need to stay in place for 2 weeks or longer.  °Check your port insertion site every day for signs of infection. Check for: °     - Redness, swelling, or pain. °                    - Fluid or blood. °     - Warmth. °     - Pus or a bad smell. °Activity °Return to your normal activities as told by your health care provider. Ask your  health care provider what activities are safe for you. °Do not lift anything that is heavier than 10 lb (4.5 kg), or the limit that you are told, until your health care provider says that it is safe. °General instructions °Take over-the-counter and prescription medicines only as told by your health care provider. °Do not take baths, swim, or use a hot tub until your health care provider approves. Ask your health care provider if you may take showers. You may only be allowed to take sponge baths. °Do not drive for 24 hours if you were given a sedative during your procedure. °Wear a medical alert bracelet in case of an emergency. This will tell any health care providers that you have a port. °Keep all follow-up visits as told by your health care provider. This is important. °Contact a health care provider if: °You cannot flush your port with saline as directed, or you cannot draw blood from the port. °You have a fever or chills. °You have redness, swelling, or pain around your port insertion site. °You have fluid or blood coming from your port insertion site. °Your port insertion site feels warm to the touch. °You have pus or a bad smell coming from the port insertion site. °Get help right away if: °You have chest pain or shortness of breath. °You have bleeding from   your port that you cannot control. °Summary °Take care of the port as told by your health care provider. Keep the manufacturer's information card with you at all times. °Change your dressing as told by your health care provider. °Contact a health care provider if you have a fever or chills or if you have redness, swelling, or pain around your port insertion site. °Keep all follow-up visits as told by your health care provider. °This information is not intended to replace advice given to you by your health care provider. Make sure you discuss any questions you have with your healthcare provider. ° °Moderate Conscious Sedation, Adult, Care After °This sheet  gives you information about how to care for yourself after your procedure. Your health care provider may also give you more specific instructions. If you have problems or questions, contact your health careprovider. °What can I expect after the procedure? °After the procedure, it is common to have: °Sleepiness for several hours. °Impaired judgment for several hours. °Difficulty with balance. °Vomiting if you eat too soon. °Follow these instructions at home: °For the time period you were told by your health care provider: °Rest. °Do not participate in activities where you could fall or become injured. °Do not drive or use machinery. °Do not drink alcohol. °Do not take sleeping pills or medicines that cause drowsiness. °Do not make important decisions or sign legal documents. °Do not take care of children on your own. °Eating and drinking ° °Follow the diet recommended by your health care provider. °Drink enough fluid to keep your urine pale yellow. °If you vomit: °Drink water, juice, or soup when you can drink without vomiting. °Make sure you have little or no nausea before eating solid foods. ° °General instructions °Take over-the-counter and prescription medicines only as told by your health care provider. °Have a responsible adult stay with you for the time you are told. It is important to have someone help care for you until you are awake and alert. °Do not smoke. °Keep all follow-up visits as told by your health care provider. This is important. °Contact a health care provider if: °You are still sleepy or having trouble with balance after 24 hours. °You feel light-headed. °You keep feeling nauseous or you keep vomiting. °You develop a rash. °You have a fever. °You have redness or swelling around the IV site. °Get help right away if: °You have trouble breathing. °You have new-onset confusion at home. °Summary °After the procedure, it is common to feel sleepy, have impaired judgment, or feel nauseous if you eat  too soon. °Rest after you get home. Know the things you should not do after the procedure. °Follow the diet recommended by your health care provider and drink enough fluid to keep your urine pale yellow. °Get help right away if you have trouble breathing or new-onset confusion at home. °This information is not intended to replace advice given to you by your health care provider. Make sure you discuss any questions you have with your healthcare provider. °Document Revised: 06/21/2019 Document Reviewed: 01/17/2019 °Elsevier Patient Education © 2022 [employer]  °

## 2021-08-16 NOTE — Procedures (Signed)
Interventional Radiology Procedure Note  Procedure: Placement of a right IJ approach single lumen PowerPort.  Tip is positioned at the superior cavoatrial junction and catheter is ready for immediate use.  Complications: No immediate Recommendations:  - Ok to shower tomorrow - Do not submerge for 7 days - Routine line care   Signed,  Zelie Asbill K. Perpetua Elling, MD   

## 2021-08-16 NOTE — H&P (Signed)
Referring Physician(s): [name]  Supervising Physician: Jacqulynn Cadet  Patient Status:  [facility]  Chief Complaint:  "I'm getting a port a cath"  Subjective: Pt known to IR service from I/E biliary drain placement via cholecystostomy access on 05/13/21, and rt thoracentesis on 05/16/21. She has a hx of newly diagnosed metastatic pancreatic cancer, s/p Whipple on 06/22/21. She presents today for port a cath placement to assist with treatment.  She currently denies fever, headache, worsening dyspnea, cough, back pain, vomiting or bleeding.  She does have some intermittent left chest discomfort below breast, occasional nausea, and intermittent mid abdominal discomfort.  Past Medical History:  Diagnosis Date   History of hip surgery    Hx of neck surgery    Hypertension    Past Surgical History:  Procedure Laterality Date   CERVICAL SPINE SURGERY     ERCP N/A 05/13/2021   Procedure: ENDOSCOPIC RETROGRADE CHOLANGIOPANCREATOGRAPHY (ERCP);  Surgeon: Carol Ada, MD;  Location: [place];  Service: Gastroenterology;  Laterality: N/A;   ESOPHAGOGASTRODUODENOSCOPY (EGD) WITH PROPOFOL N/A 05/14/2021   Procedure: ESOPHAGOGASTRODUODENOSCOPY (EGD) WITH PROPOFOL;  Surgeon: Carol Ada, MD;  Location: [place];  Service: Gastroenterology;  Laterality: N/A;   EUS Left 05/14/2021   Procedure: UPPER ENDOSCOPIC ULTRASOUND (EUS) LINEAR;  Surgeon: Carol Ada, MD;  Location: [place];  Service: Gastroenterology;  Laterality: Left;   FINE NEEDLE ASPIRATION  05/14/2021   Procedure: FINE NEEDLE ASPIRATION (FNA) LINEAR;  Surgeon: Carol Ada, MD;  Location: [place];  Service: Gastroenterology;;   HAMMER TOE SURGERY     HIP SURGERY     IR INT EXT BILIARY DRAIN WITH CHOLANGIOGRAM  05/13/2021   IR PATIENT EVAL TECH 0-60 MINS  05/20/2021   KNEE SURGERY     PANCREATICODUODENECTOMY  06/22/2021   stated per pt.      Allergies: Amlodipine besylate, Hydrochlorothiazide,  Lexapro [escitalopram oxalate], Oxycodone, and Penicillins  Medications: Prior to Admission medications   Medication Sig Start Date End Date Taking? Authorizing Provider  B Complex Vitamins (VITAMIN B COMPLEX PO) Take 1 capsule by mouth daily.    [provider]  ciprofloxacin (CIPRO) 500 MG tablet Take 500 mg by mouth 2 (two) times daily. 06/17/21   [provider]  diltiazem (CARDIZEM) 60 MG tablet Take 60 mg by mouth 3 (three) times daily. 07/22/21   [provider]  diltiazem (DILACOR XR) 180 MG 24 hr capsule Take 180 mg by mouth at bedtime. 04/27/21   [provider]  enoxaparin (LOVENOX) 40 MG/0.4ML injection Inject 40 mg into the skin daily. 06/17/21   [provider]  HYDROmorphone (DILAUDID) 2 MG tablet Take 2 mg by mouth every 4 (four) hours as needed. Patient not taking: Reported on 06/21/2021 06/17/21   [provider]  hydrOXYzine (ATARAX) 25 MG tablet Take 25 mg by mouth every 8 (eight) hours as needed for anxiety or itching. 05/06/21   [provider]  Magnesium 400 MG TABS Take 400 mg by mouth See admin instructions. Take half tab (200 mg) twice daily.    [provider]  Melatonin 1 MG/4ML LIQD Take 1 mg by mouth once. Take at night for sleep    [provider]  melatonin 3 MG TABS tablet Take 3 mg by mouth at bedtime. Patient not taking: Reported on 08/09/2021    [provider]  metroNIDAZOLE (FLAGYL) 500 MG tablet Take 500 mg by mouth 2 (two) times daily. 06/17/21   [provider]  NON FORMULARY CPAP  at bedtime    [provider]  olmesartan (BENICAR) 20 MG tablet Take 20 mg by mouth daily. 07/26/21   [provider]  olmesartan (BENICAR) 40 MG tablet Take 40 mg by mouth at bedtime. 03/17/20   [provider]  ondansetron (ZOFRAN) 8 MG tablet Take 1 tablet (8 mg total) by mouth every 8 (eight) hours as needed for nausea or vomiting. 05/20/21   Heilingoetter,  Cassandra L, PA-C  pantoprazole (PROTONIX) 40 MG tablet Take 40 mg by mouth daily. 07/22/21   [provider]  Potassium Chloride ER 20 MEQ TBCR Take 20 mEq by mouth daily. 06/17/21   [provider]  traMADol (ULTRAM) 50 MG tablet Take 1 tablet (50 mg total) by mouth every 6 (six) hours as needed for severe pain. Patient not taking: Reported on 06/21/2021 05/17/21   Elgergawy, Silver Huguenin, MD  Vitamin D, Ergocalciferol, (DRISDOL) 1.25 MG (50000 UNIT) CAPS capsule Take 50,000 Units by mouth once a week. 05/28/21   [provider]     Vital Signs: BP 131/69   Pulse 70   Temp 98 F (36.7 C) (Oral)   Resp 16   Ht 5' (1.524 m)   Wt 124 lb (56.2 kg)   SpO2 99%   BMI 24.22 kg/m   Physical Exam awake, alert.  Chest clear to auscultation bilaterally.  Heart with regular rate and rhythm.  Abdomen soft, positive bowel sounds, some mild mid abdominal tenderness to palpation at wound site (recent Whipple).  Pretibial edema noted bilaterally.  Imaging: No results found.  Labs:  CBC: Recent Labs    05/16/21 0011 05/17/21 0035 06/21/21 0254 08/04/21 1800  WBC 11.3* 8.5 10.8* 7.0  HGB 10.0* 10.4* 10.7* 11.7*  HCT 29.5* 31.4* 32.6* 35.9*  PLT 236 309 402* 380    COAGS: Recent Labs    05/12/21 0343 06/21/21 0254  INR 0.9 1.0  APTT 27  --     BMP: Recent Labs    05/17/21 0035 05/19/21 1151 06/21/21 0254 08/04/21 1800  NA 137 135 135 139  K 4.0 4.3 4.6 3.4*  CL 103 99 101 103  CO2 '25 29 24 27  '$ GLUCOSE 141* 213* 199* 126*  BUN '16 23 15 13  '$ CALCIUM 8.7* 9.1 9.1 8.6*  CREATININE 1.10* 1.42* 0.90 0.86  GFRNONAA 52* 39* >60 >60    LIVER FUNCTION TESTS: Recent Labs    05/17/21 0035 05/19/21 1151 06/21/21 0254 08/04/21 1800  BILITOT 3.0* 2.5* 2.1* 0.6  AST 57* 80* 295* 28  ALT 308* 224* 254* 24  ALKPHOS 190* 196* 442* 98  PROT 5.8* 7.1 6.6 6.7  ALBUMIN 2.5* 3.8 3.5 3.0*    Assessment and Plan: Pt known to IR service from I/E biliary drain  placement via cholecystostomy access on 05/13/21, and rt thoracentesis on 05/16/21. She has a hx of newly diagnosed metastatic pancreatic cancer, s/p Whipple on 06/22/21. She presents today for port a cath placement to assist with treatment. Risks and benefits of image guided port-a-catheter placement was discussed with the patient including, but not limited to bleeding, infection, pneumothorax, or fibrin sheath development and need for additional procedures.  All of the patient's questions were answered, patient is agreeable to proceed. Consent signed and in chart.    Electronically Signed: D. Rowe Robert, PA-C 08/16/2021, 12:38 PM   I spent a total of 25 minutes at the the patient's bedside AND on the patient's hospital floor or unit, greater than 50% of which was counseling/coordinating  care for Port-A-Cath placement

## 2021-08-18 ENCOUNTER — Inpatient Hospital Stay: Payer: Medicare Other | Admitting: Nutrition

## 2021-08-18 NOTE — Progress Notes (Signed)
76 year old female diagnosed with Pancreas cancer and followed by Dr. Benay Spice.  She was taken to a pancreaticoduodenectomy at [facility] on 06/22/2021.  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  The resection margins are negative.  3 of 36 lymph nodes contained metastatic disease.  Lymphovascular and perineural invasion were present.  The tumor was staged as [id number] pancreas cancer.   She developed a wound infection and was taken back to the operating room for fascial dehiscence on 07/02/2021.  This was managed with primary fascial closure and placement of a wound VAC.  She was discharged home on 07/12/2021.  PMH includes HTN, OSA, DM, obstructive jaundice.  Medications include B complex, magnesium, flagyl, zofran, Protonix, Vit D.  Labs include K 3.4, Glucose 126 and Alb 3.0  Height: 5 ' Weight: 124 pounds. UBW: 154 pounds per patient BMI: 24.18.  Patient reports a 30 Pound wt loss since April 2023. She has low grade nausea continuously. Reports Diarrhea every 2 hours. Dislike Ensure and Boost and states they are too sweet and have too much sugar. She does not remember receiving the [employer] 1.4 supplements in the hospital. She is not currently on pancreatic enzymes.  Nutrition Diagnosis: Unintended wt loss related to cancer and associated treatments as evidenced by 30 pound wt loss per patient recall.  Intervention: Educated to consume small, frequent meals and snacks with adequate calories and protein to minimize wt loss. Reviewed foods high in protein. Education provided on taste alterations per pt questions. Recommend pancreatic enzymes. Encouraged taking anti emetics for nausea. Nutrition fact sheets were emailed and a hard copy to be given tomorrow when she comes for patient education and treatment. Questions answered. Contact information provided.  Monitoring, Evaluation, Goals: Patient will  tolerate adequate calories and protein for wt maintenance.  Next visit: Patient to contact RD with questions.

## 2021-08-19 ENCOUNTER — Encounter: Payer: Self-pay | Admitting: Nurse Practitioner

## 2021-08-19 ENCOUNTER — Other Ambulatory Visit: Payer: Self-pay | Admitting: *Deleted

## 2021-08-19 ENCOUNTER — Inpatient Hospital Stay (HOSPITAL_BASED_OUTPATIENT_CLINIC_OR_DEPARTMENT_OTHER): Payer: Medicare Other | Admitting: Nurse Practitioner

## 2021-08-19 ENCOUNTER — Inpatient Hospital Stay: Payer: Medicare Other

## 2021-08-19 ENCOUNTER — Other Ambulatory Visit: Payer: Self-pay

## 2021-08-19 ENCOUNTER — Ambulatory Visit (INDEPENDENT_AMBULATORY_CARE_PROVIDER_SITE_OTHER): Payer: Medicare Other

## 2021-08-19 VITALS — BP 157/70 | HR 62

## 2021-08-19 VITALS — BP 122/64 | HR 61 | Temp 98.2°F | Resp 18 | Ht 60.0 in | Wt 125.6 lb

## 2021-08-19 DIAGNOSIS — Z5111 Encounter for antineoplastic chemotherapy: Secondary | ICD-10-CM | POA: Diagnosis not present

## 2021-08-19 DIAGNOSIS — C7989 Secondary malignant neoplasm of other specified sites: Secondary | ICD-10-CM | POA: Diagnosis not present

## 2021-08-19 DIAGNOSIS — C250 Malignant neoplasm of head of pancreas: Secondary | ICD-10-CM

## 2021-08-19 DIAGNOSIS — C779 Secondary and unspecified malignant neoplasm of lymph node, unspecified: Secondary | ICD-10-CM | POA: Diagnosis not present

## 2021-08-19 LAB — CBC WITH DIFFERENTIAL (CANCER CENTER ONLY)
Abs Immature Granulocytes: 0.02 10*3/uL (ref 0.00–0.07)
Basophils Absolute: 0 10*3/uL (ref 0.0–0.1)
Basophils Relative: 1 %
Eosinophils Absolute: 0.3 10*3/uL (ref 0.0–0.5)
Eosinophils Relative: 5 %
HCT: 30.8 % — ABNORMAL LOW (ref 36.0–46.0)
Hemoglobin: 9.8 g/dL — ABNORMAL LOW (ref 12.0–15.0)
Immature Granulocytes: 0 %
Lymphocytes Relative: 29 %
Lymphs Abs: 1.6 10*3/uL (ref 0.7–4.0)
MCH: 28.5 pg (ref 26.0–34.0)
MCHC: 31.8 g/dL (ref 30.0–36.0)
MCV: 89.5 fL (ref 80.0–100.0)
Monocytes Absolute: 0.5 10*3/uL (ref 0.1–1.0)
Monocytes Relative: 8 %
Neutro Abs: 3.2 10*3/uL (ref 1.7–7.7)
Neutrophils Relative %: 57 %
Platelet Count: 300 10*3/uL (ref 150–400)
RBC: 3.44 MIL/uL — ABNORMAL LOW (ref 3.87–5.11)
RDW: 16.9 % — ABNORMAL HIGH (ref 11.5–15.5)
WBC Count: 5.6 10*3/uL (ref 4.0–10.5)
nRBC: 0 % (ref 0.0–0.2)

## 2021-08-19 LAB — CMP (CANCER CENTER ONLY)
ALT: 18 U/L (ref 0–44)
AST: 22 U/L (ref 15–41)
Albumin: 2.7 g/dL — ABNORMAL LOW (ref 3.5–5.0)
Alkaline Phosphatase: 72 U/L (ref 38–126)
Anion gap: 8 (ref 5–15)
BUN: 10 mg/dL (ref 8–23)
CO2: 27 mmol/L (ref 22–32)
Calcium: 8.4 mg/dL — ABNORMAL LOW (ref 8.9–10.3)
Chloride: 104 mmol/L (ref 98–111)
Creatinine: 0.83 mg/dL (ref 0.44–1.00)
GFR, Estimated: >60 mL/min (ref >60)
Glucose, Bld: 201 mg/dL — ABNORMAL HIGH (ref 70–99)
Potassium: 3.6 mmol/L (ref 3.5–5.1)
Sodium: 139 mmol/L (ref 135–145)
Total Bilirubin: 0.4 mg/dL (ref 0.3–1.2)
Total Protein: 5.5 g/dL — ABNORMAL LOW (ref 6.5–8.1)

## 2021-08-19 LAB — MAGNESIUM: Magnesium: 2 mg/dL (ref 1.7–2.4)

## 2021-08-19 MED ORDER — SODIUM CHLORIDE 0.9 % IV SOLN: Freq: Once | INTRAVENOUS | Status: AC

## 2021-08-19 MED ORDER — PACLITAXEL PROTEIN-BOUND CHEMO INJECTION 100 MG
100.0000 mg/m2 | Freq: Once | INTRAVENOUS | Status: AC
  Administered 2021-08-19: 150 mg via INTRAVENOUS
  Filled 2021-08-19: qty 30

## 2021-08-19 MED ORDER — PROCHLORPERAZINE MALEATE 10 MG PO TABS
10.0000 mg | ORAL_TABLET | Freq: Once | ORAL | Status: AC
  Administered 2021-08-19: 10 mg via ORAL
  Filled 2021-08-19: qty 1

## 2021-08-19 MED ORDER — SODIUM CHLORIDE 0.9% FLUSH
10.0000 mL | INTRAVENOUS | Status: DC | PRN
  Administered 2021-08-19: 10 mL

## 2021-08-19 MED ORDER — PROCHLORPERAZINE MALEATE 10 MG PO TABS: 10.0000 mg | ORAL_TABLET | Freq: Four times a day (QID) | ORAL | 0 refills | Status: DC | PRN

## 2021-08-19 MED ORDER — SODIUM CHLORIDE 0.9 % IV SOLN
1000.0000 mg/m2 | Freq: Once | INTRAVENOUS | Status: AC
  Administered 2021-08-19: 1558 mg via INTRAVENOUS
  Filled 2021-08-19: qty 15.78

## 2021-08-19 MED ORDER — HEPARIN SOD (PORK) LOCK FLUSH 100 UNIT/ML IV SOLN
500.0000 [IU] | Freq: Once | INTRAVENOUS | Status: AC | PRN
  Administered 2021-08-19: 500 [IU]

## 2021-08-19 MED ORDER — PANTOPRAZOLE SODIUM 40 MG PO TBEC: 40.0000 mg | DELAYED_RELEASE_TABLET | Freq: Every day | ORAL | 1 refills | Status: DC

## 2021-08-19 MED ORDER — PANCRELIPASE (LIP-PROT-AMYL) 36000-114000 UNITS PO CPEP: ORAL_CAPSULE | ORAL | 1 refills | Status: DC

## 2021-08-19 NOTE — Patient Instructions (Signed)
[facility]  Discharge Instructions: Thank you for choosing [facility] to provide your oncology and hematology care.   If you have a lab appointment with the Cancer Center, please go directly to the Cancer Center and check in at the registration area.   Wear comfortable clothing and clothing appropriate for easy access to any Portacath or PICC line.   We strive to give you quality time with your provider. You may need to reschedule your appointment if you arrive late (15 or more minutes).  Arriving late affects you and other patients whose appointments are after yours.  Also, if you miss three or more appointments without notifying the office, you may be dismissed from the clinic at the provider's discretion.      For prescription refill requests, have your pharmacy contact our office and allow 72 hours for refills to be completed.    Today you received the following chemotherapy and/or immunotherapy agents Abraxane, Gemzar.       To help prevent nausea and vomiting after your treatment, we encourage you to take your nausea medication as directed.  BELOW ARE SYMPTOMS THAT SHOULD BE REPORTED IMMEDIATELY: *FEVER GREATER THAN 100.4 F (38 C) OR HIGHER *CHILLS OR SWEATING *NAUSEA AND VOMITING THAT IS NOT CONTROLLED WITH YOUR NAUSEA MEDICATION *UNUSUAL SHORTNESS OF BREATH *UNUSUAL BRUISING OR BLEEDING *URINARY PROBLEMS (pain or burning when urinating, or frequent urination) *BOWEL PROBLEMS (unusual diarrhea, constipation, pain near the anus) TENDERNESS IN MOUTH AND THROAT WITH OR WITHOUT PRESENCE OF ULCERS (sore throat, sores in mouth, or a toothache) UNUSUAL RASH, SWELLING OR PAIN  UNUSUAL VAGINAL DISCHARGE OR ITCHING   Items with * indicate a potential emergency and should be followed up as soon as possible or go to the Emergency Department if any problems should occur.  Please show the CHEMOTHERAPY ALERT CARD or IMMUNOTHERAPY ALERT CARD at  check-in to the Emergency Department and triage nurse.  Should you have questions after your visit or need to cancel or reschedule your appointment, please contact [facility]  Dept: [phone]  and follow the prompts.  Office hours are 8:00 a.m. to 4:30 p.m. Monday - Friday. Please note that voicemails left after 4:00 p.m. may not be returned until the following business day.  We are closed weekends and major holidays. You have access to a nurse at all times for urgent questions. Please call the main number to the clinic Dept: [phone] and follow the prompts.   For any non-urgent questions, you may also contact your provider using MyChart. We now offer e-Visits for anyone 18 and older to request care online for non-urgent symptoms. For details visit [email or web address].   Also download the MyChart app! Go to the app store, search "MyChart", open the app, select [facility], and log in with your MyChart username and password.  Masks are optional in the cancer centers. If you would like for your care team to wear a mask while they are taking care of you, please let them know. For doctor visits, patients may have with them one support person who is at least 76 years old. At this time, visitors are not allowed in the infusion area.  Nanoparticle Albumin-Bound Paclitaxel injection What is this medication? NANOPARTICLE ALBUMIN-BOUND PACLITAXEL (Na no PAHR ti kuhl  al BYOO muhn-bound  PAK li TAX el) is a chemotherapy drug. It targets fast dividing cells, like cancer cells, and causes these cells to die. This medicine is used to   treat advanced breast cancer, lung cancer, and pancreatic cancer. This medicine may be used for other purposes; ask your health care provider or pharmacist if you have questions. COMMON BRAND NAME(S): Abraxane What should I tell my care team before I take this medication? They need to know if you have any of these conditions: kidney  disease liver disease low blood counts, like low white cell, platelet, or red cell counts lung or breathing disease, like asthma tingling of the fingers or toes, or other nerve disorder an unusual or allergic reaction to paclitaxel, albumin, other chemotherapy, other medicines, foods, dyes, or preservatives pregnant or trying to get pregnant breast-feeding How should I use this medication? This drug is given as an infusion into a vein. It is administered in a hospital or clinic by a specially trained health care professional. Talk to your pediatrician regarding the use of this medicine in children. Special care may be needed. Overdosage: If you think you have taken too much of this medicine contact a poison control center or emergency room at once. NOTE: This medicine is only for you. Do not share this medicine with others. What if I miss a dose? It is important not to miss your dose. Call your doctor or health care professional if you are unable to keep an appointment. What may interact with this medication? This medicine may interact with the following medications: antiviral medicines for hepatitis, HIV or AIDS certain antibiotics like erythromycin and clarithromycin certain medicines for fungal infections like ketoconazole and itraconazole certain medicines for seizures like carbamazepine, phenobarbital, phenytoin gemfibrozil nefazodone rifampin St. John's wort This list may not describe all possible interactions. Give your health care provider a list of all the medicines, herbs, non-prescription drugs, or dietary supplements you use. Also tell them if you smoke, drink alcohol, or use illegal drugs. Some items may interact with your medicine. What should I watch for while using this medication? Your condition will be monitored carefully while you are receiving this medicine. You will need important blood work done while you are taking this medicine. This medicine can cause serious  allergic reactions. If you experience allergic reactions like skin rash, itching or hives, swelling of the face, lips, or tongue, tell your doctor or health care professional right away. In some cases, you may be given additional medicines to help with side effects. Follow all directions for their use. This drug may make you feel generally unwell. This is not uncommon, as chemotherapy can affect healthy cells as well as cancer cells. Report any side effects. Continue your course of treatment even though you feel ill unless your doctor tells you to stop. Call your doctor or health care professional for advice if you get a fever, chills or sore throat, or other symptoms of a cold or flu. Do not treat yourself. This drug decreases your body's ability to fight infections. Try to avoid being around people who are sick. This medicine may increase your risk to bruise or bleed. Call your doctor or health care professional if you notice any unusual bleeding. Be careful brushing and flossing your teeth or using a toothpick because you may get an infection or bleed more easily. If you have any dental work done, tell your dentist you are receiving this medicine. Avoid taking products that contain aspirin, acetaminophen, ibuprofen, naproxen, or ketoprofen unless instructed by your doctor. These medicines may hide a fever. Do not become pregnant while taking this medicine or for 6 months after stopping it. Women should inform   their doctor if they wish to become pregnant or think they might be pregnant. Men should not father a child while taking this medicine or for 3 months after stopping it. There is a potential for serious side effects to an unborn child. Talk to your health care professional or pharmacist for more information. Do not breast-feed an infant while taking this medicine or for 2 weeks after stopping it. This medicine may interfere with the ability to get pregnant or to father a child. You should talk to  your doctor or health care professional if you are concerned about your fertility. What side effects may I notice from receiving this medication? Side effects that you should report to your doctor or health care professional as soon as possible: allergic reactions like skin rash, itching or hives, swelling of the face, lips, or tongue breathing problems changes in vision fast, irregular heartbeat low blood pressure mouth sores pain, tingling, numbness in the hands or feet signs of decreased platelets or bleeding - bruising, pinpoint red spots on the skin, black, tarry stools, blood in the urine signs of decreased red blood cells - unusually weak or tired, feeling faint or lightheaded, falls signs of infection - fever or chills, cough, sore throat, pain or difficulty passing urine signs and symptoms of liver injury like dark yellow or brown urine; general ill feeling or flu-like symptoms; light-colored stools; loss of appetite; nausea; right upper belly pain; unusually weak or tired; yellowing of the eyes or skin swelling of the ankles, feet, hands unusually slow heartbeat Side effects that usually do not require medical attention (report to your doctor or health care professional if they continue or are bothersome): diarrhea hair loss loss of appetite nausea, vomiting tiredness This list may not describe all possible side effects. Call your doctor for medical advice about side effects. You may report side effects to FDA at 1-800-FDA-1088. Where should I keep my medication? This drug is given in a hospital or clinic and will not be stored at home. NOTE: This sheet is a summary. It may not cover all possible information. If you have questions about this medicine, talk to your doctor, pharmacist, or health care provider.  2023 Elsevier/Gold Standard (2016-10-25 00:00:00) Gemcitabine injection What is this medication? GEMCITABINE (jem SYE ta been) is a chemotherapy drug. This medicine is  used to treat many types of cancer like breast cancer, lung cancer, pancreatic cancer, and ovarian cancer. This medicine may be used for other purposes; ask your health care provider or pharmacist if you have questions. COMMON BRAND NAME(S): Gemzar, Infugem What should I tell my care team before I take this medication? They need to know if you have any of these conditions: blood disorders infection kidney disease liver disease lung or breathing disease, like asthma recent or ongoing radiation therapy an unusual or allergic reaction to gemcitabine, other chemotherapy, other medicines, foods, dyes, or preservatives pregnant or trying to get pregnant breast-feeding How should I use this medication? This drug is given as an infusion into a vein. It is administered in a hospital or clinic by a specially trained health care professional. Talk to your pediatrician regarding the use of this medicine in children. Special care may be needed. Overdosage: If you think you have taken too much of this medicine contact a poison control center or emergency room at once. NOTE: This medicine is only for you. Do not share this medicine with others. What if I miss a dose? It is important not to miss   your dose. Call your doctor or health care professional if you are unable to keep an appointment. What may interact with this medication? medicines to increase blood counts like filgrastim, pegfilgrastim, sargramostim some other chemotherapy drugs like cisplatin vaccines Talk to your doctor or health care professional before taking any of these medicines: acetaminophen aspirin ibuprofen ketoprofen naproxen This list may not describe all possible interactions. Give your health care provider a list of all the medicines, herbs, non-prescription drugs, or dietary supplements you use. Also tell them if you smoke, drink alcohol, or use illegal drugs. Some items may interact with your medicine. What should I watch  for while using this medication? Visit your doctor for checks on your progress. This drug may make you feel generally unwell. This is not uncommon, as chemotherapy can affect healthy cells as well as cancer cells. Report any side effects. Continue your course of treatment even though you feel ill unless your doctor tells you to stop. In some cases, you may be given additional medicines to help with side effects. Follow all directions for their use. Call your doctor or health care professional for advice if you get a fever, chills or sore throat, or other symptoms of a cold or flu. Do not treat yourself. This drug decreases your body's ability to fight infections. Try to avoid being around people who are sick. This medicine may increase your risk to bruise or bleed. Call your doctor or health care professional if you notice any unusual bleeding. Be careful brushing and flossing your teeth or using a toothpick because you may get an infection or bleed more easily. If you have any dental work done, tell your dentist you are receiving this medicine. Avoid taking products that contain aspirin, acetaminophen, ibuprofen, naproxen, or ketoprofen unless instructed by your doctor. These medicines may hide a fever. Do not become pregnant while taking this medicine or for 6 months after stopping it. Women should inform their doctor if they wish to become pregnant or think they might be pregnant. Men should not father a child while taking this medicine and for 3 months after stopping it. There is a potential for serious side effects to an unborn child. Talk to your health care professional or pharmacist for more information. Do not breast-feed an infant while taking this medicine or for at least 1 week after stopping it. Men should inform their doctors if they wish to father a child. This medicine may lower sperm counts. Talk with your doctor or health care professional if you are concerned about your fertility. What  side effects may I notice from receiving this medication? Side effects that you should report to your doctor or health care professional as soon as possible: allergic reactions like skin rash, itching or hives, swelling of the face, lips, or tongue breathing problems pain, redness, or irritation at site where injected signs and symptoms of a dangerous change in heartbeat or heart rhythm like chest pain; dizziness; fast or irregular heartbeat; palpitations; feeling faint or lightheaded, falls; breathing problems signs of decreased platelets or bleeding - bruising, pinpoint red spots on the skin, black, tarry stools, blood in the urine signs of decreased red blood cells - unusually weak or tired, feeling faint or lightheaded, falls signs of infection - fever or chills, cough, sore throat, pain or difficulty passing urine signs and symptoms of kidney injury like trouble passing urine or change in the amount of urine signs and symptoms of liver injury like dark yellow or brown urine;   general ill feeling or flu-like symptoms; light-colored stools; loss of appetite; nausea; right upper belly pain; unusually weak or tired; yellowing of the eyes or skin swelling of ankles, feet, hands Side effects that usually do not require medical attention (report to your doctor or health care professional if they continue or are bothersome): constipation diarrhea hair loss loss of appetite nausea rash vomiting This list may not describe all possible side effects. Call your doctor for medical advice about side effects. You may report side effects to FDA at 1-800-FDA-1088. Where should I keep my medication? This drug is given in a hospital or clinic and will not be stored at home. NOTE: This sheet is a summary. It may not cover all possible information. If you have questions about this medicine, talk to your doctor, pharmacist, or health care provider.  2023 Elsevier/Gold Standard (2017-05-17 00:00:00)  

## 2021-08-19 NOTE — Progress Notes (Signed)
Patient seen by Ned Card NP today  Vitals are within treatment parameters.  Labs reviewed by Ned Card NP and are within treatment parameters.  Per physician team,  Doppler of bilateral legs, swelling x 3 days  36,000 units Creon with first bite of meal TID.  Will have treatment after Doppler today

## 2021-08-19 NOTE — Progress Notes (Signed)
[facility] OFFICE PROGRESS NOTE   Diagnosis: Pancreas cancer  INTERVAL HISTORY:   Tonya Jenkins returns as scheduled.  She is feeling stronger.  She describes her appetite as "not bad".  She is in bed the majority of the day because it is "comfortable".  She continues to have frequent loose stools, estimates every 2 hours day and night.  She has low-grade constant nausea.  She has noted swelling of the left leg for the past 3 to 4 days.  Objective:  Vital signs in last 24 hours:  Blood pressure 122/64, pulse 61, temperature 98.2 F (36.8 C), temperature source Oral, resp. rate 18, height 5' (1.524 m), weight 125 lb 9.6 oz (57 kg), SpO2 100 %.    HEENT: No thrush or ulcers. Resp: Lungs clear bilaterally. Cardio: Regular rate and rhythm. GI: Abdomen soft and nontender.  Midline wound healed except for a very superficial opening with serous discharge at the mid to lower aspect of the incision. Vascular: Entire left leg is edematous.  Trace edema right lower leg. Port-A-Cath without erythema.  Lab Results:  Lab Results  Component Value Date   WBC 5.6 08/19/2021   HGB 9.8 (L) 08/19/2021   HCT 30.8 (L) 08/19/2021   MCV 89.5 08/19/2021   PLT 300 08/19/2021   NEUTROABS 3.2 08/19/2021    Imaging:  No results found.  Medications: I have reviewed the patient's current medications.  Assessment/Plan: Pancreas cancer, stage III (pT2pN2) CT abdomen/pelvis 05/12/2021-dilated intrahepatic and [place] bile ducts, abrupt narrowing of the common bile duct and pancreatic duct at the level of pancreas head, distended gallbladder MRI/MRCP 05/13/2018 23-3.6 x 2.1 x 4.2 cm enhancing mass in the head of the pancreas with obstruction of the pancreatic duct and common bile duct, no lymphadenopathy, no liver lesions Internal/external biliary drain 05/14/2021 CT chest 05/14/2021-small right pleural effusion, no evidence of metastatic disease Thoracentesis 05/16/2021-negative  cytology Panc[facility]oden[facility] at [facility] 06/22/2021-no evidence of metastatic disease, inflamed gallbladder with percutaneous biliary drain, large pancreas head mass-gallbladder with acute cholecystitis and abscess formation negative for malignancy.  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  She is scheduled to begin treatment today with cycle 1 gemcitabine/Abraxane.  She will have a chemotherapy education class today. Plan to proceed as scheduled.   CBC and chemistry panel reviewed.  Labs adequate to proceed with treatment.  Left leg is edematous.  We referred her for bilateral venous Doppler studies today.  She will begin pancreatic enzyme replacement to see if this helps with the diarrhea.  We also discussed Imodium.  She will return for lab, follow-up, cycle 2 gemcitabine/Abraxane in 2 weeks.  Patient seen with Dr. Benay Spice.  Ned Card ANP/GNP-BC   08/19/2021  10:14 AM This was a shared visit with Ned Card.  Tonya Jenkins was interviewed and examined.  There has been further healing of the abdominal wound.  She has a borderline performance status, but appears eligible to begin adjuvant chemotherapy today.  She has a high risk of developing recurrent disease and is now 2 months out from surgery.  She would like to proceed with treatment. We encouraged her to increase her calorie intake and  activity level.   I was present for greater than 50% of today's visit.  I performed medical decision making.  Julieanne Manson, MD

## 2021-08-20 ENCOUNTER — Encounter: Payer: Self-pay | Admitting: *Deleted

## 2021-08-20 NOTE — Progress Notes (Signed)
PATIENT NAVIGATOR PROGRESS NOTE  Name: Tonya Jenkins Date: 08/20/2021 MRN: [id number]  DOB: 01/07/1946   Reason for visit:  F/U after first treatment  Comments:  spoke with patient after first treatment, reviewed hydration goals, nutrition and eating every 3-4 hours.  She slept well overnight and did not experience any nausea.  Reviewed upcoming appts and need for transportation.  She has been enrolled in transportation program Will call a few days prior to next appt for set up  Reviewed how to call with any issues or questions Pt appreciated call   Time spent counseling/coordinating care: 30-45 minutes

## 2021-08-29 ENCOUNTER — Other Ambulatory Visit: Payer: Self-pay | Admitting: Oncology

## 2021-08-31 ENCOUNTER — Encounter: Payer: Self-pay | Admitting: *Deleted

## 2021-09-02 ENCOUNTER — Encounter: Payer: Self-pay | Admitting: *Deleted

## 2021-09-02 ENCOUNTER — Inpatient Hospital Stay: Payer: Medicare Other

## 2021-09-02 ENCOUNTER — Other Ambulatory Visit (HOSPITAL_BASED_OUTPATIENT_CLINIC_OR_DEPARTMENT_OTHER): Payer: Self-pay

## 2021-09-02 ENCOUNTER — Encounter: Payer: Self-pay | Admitting: Nurse Practitioner

## 2021-09-02 ENCOUNTER — Encounter: Payer: Self-pay | Admitting: Oncology

## 2021-09-02 ENCOUNTER — Other Ambulatory Visit: Payer: Self-pay | Admitting: *Deleted

## 2021-09-02 ENCOUNTER — Inpatient Hospital Stay (HOSPITAL_BASED_OUTPATIENT_CLINIC_OR_DEPARTMENT_OTHER): Payer: Medicare Other | Admitting: Nurse Practitioner

## 2021-09-02 VITALS — BP 153/72 | HR 76

## 2021-09-02 VITALS — BP 132/68 | HR 80 | Temp 98.1°F | Resp 18 | Ht 60.0 in | Wt 118.8 lb

## 2021-09-02 DIAGNOSIS — C250 Malignant neoplasm of head of pancreas: Secondary | ICD-10-CM

## 2021-09-02 DIAGNOSIS — Z5111 Encounter for antineoplastic chemotherapy: Secondary | ICD-10-CM | POA: Diagnosis not present

## 2021-09-02 LAB — CMP (CANCER CENTER ONLY)
ALT: 30 U/L (ref 0–44)
AST: 35 U/L (ref 15–41)
Albumin: 2.6 g/dL — ABNORMAL LOW (ref 3.5–5.0)
Alkaline Phosphatase: 78 U/L (ref 38–126)
Anion gap: 9 (ref 5–15)
BUN: 11 mg/dL (ref 8–23)
CO2: 28 mmol/L (ref 22–32)
Calcium: 8.3 mg/dL — ABNORMAL LOW (ref 8.9–10.3)
Chloride: 101 mmol/L (ref 98–111)
Creatinine: 1.08 mg/dL — ABNORMAL HIGH (ref 0.44–1.00)
GFR, Estimated: 54 mL/min — ABNORMAL LOW (ref >60)
Glucose, Bld: 172 mg/dL — ABNORMAL HIGH (ref 70–99)
Potassium: 3.4 mmol/L — ABNORMAL LOW (ref 3.5–5.1)
Sodium: 138 mmol/L (ref 135–145)
Total Bilirubin: 0.4 mg/dL (ref 0.3–1.2)
Total Protein: 5.9 g/dL — ABNORMAL LOW (ref 6.5–8.1)

## 2021-09-02 LAB — CBC WITH DIFFERENTIAL (CANCER CENTER ONLY)
Abs Immature Granulocytes: 0.02 10*3/uL (ref 0.00–0.07)
Basophils Absolute: 0.1 10*3/uL (ref 0.0–0.1)
Basophils Relative: 2 %
Eosinophils Absolute: 0.2 10*3/uL (ref 0.0–0.5)
Eosinophils Relative: 6 %
HCT: 29.9 % — ABNORMAL LOW (ref 36.0–46.0)
Hemoglobin: 9.8 g/dL — ABNORMAL LOW (ref 12.0–15.0)
Immature Granulocytes: 1 %
Lymphocytes Relative: 33 %
Lymphs Abs: 1.4 10*3/uL (ref 0.7–4.0)
MCH: 28.7 pg (ref 26.0–34.0)
MCHC: 32.8 g/dL (ref 30.0–36.0)
MCV: 87.7 fL (ref 80.0–100.0)
Monocytes Absolute: 0.5 10*3/uL (ref 0.1–1.0)
Monocytes Relative: 12 %
Neutro Abs: 1.9 10*3/uL (ref 1.7–7.7)
Neutrophils Relative %: 46 %
Platelet Count: 437 10*3/uL — ABNORMAL HIGH (ref 150–400)
RBC: 3.41 MIL/uL — ABNORMAL LOW (ref 3.87–5.11)
RDW: 16.6 % — ABNORMAL HIGH (ref 11.5–15.5)
WBC Count: 4.1 10*3/uL (ref 4.0–10.5)
nRBC: 0 % (ref 0.0–0.2)

## 2021-09-02 MED ORDER — PACLITAXEL PROTEIN-BOUND CHEMO INJECTION 100 MG
100.0000 mg/m2 | Freq: Once | INTRAVENOUS | Status: AC
  Administered 2021-09-02: 150 mg via INTRAVENOUS
  Filled 2021-09-02: qty 30

## 2021-09-02 MED ORDER — SODIUM CHLORIDE 0.9% FLUSH
10.0000 mL | INTRAVENOUS | Status: DC | PRN
  Administered 2021-09-02: 10 mL

## 2021-09-02 MED ORDER — HEPARIN SOD (PORK) LOCK FLUSH 100 UNIT/ML IV SOLN
500.0000 [IU] | Freq: Once | INTRAVENOUS | Status: AC | PRN
  Administered 2021-09-02: 500 [IU]

## 2021-09-02 MED ORDER — HYDROXYZINE HCL 25 MG PO TABS: 25.0000 mg | ORAL_TABLET | Freq: Every evening | ORAL | 0 refills | Status: DC | PRN

## 2021-09-02 MED ORDER — PROCHLORPERAZINE MALEATE 10 MG PO TABS
10.0000 mg | ORAL_TABLET | Freq: Once | ORAL | Status: AC
  Administered 2021-09-02: 10 mg via ORAL
  Filled 2021-09-02: qty 1

## 2021-09-02 MED ORDER — PANCRELIPASE (LIP-PROT-AMYL) 36000-114000 UNITS PO CPEP
ORAL_CAPSULE | ORAL | 1 refills | Status: DC
  Filled 2021-09-02: qty 150, 30d supply, fill #0
  Filled 2021-12-03: qty 150, 30d supply, fill #1

## 2021-09-02 MED ORDER — SODIUM CHLORIDE 0.9 % IV SOLN
1000.0000 mg/m2 | Freq: Once | INTRAVENOUS | Status: AC
  Administered 2021-09-02: 1558 mg via INTRAVENOUS
  Filled 2021-09-02: qty 14.68

## 2021-09-02 MED ORDER — SODIUM CHLORIDE 0.9 % IV SOLN: Freq: Once | INTRAVENOUS | Status: AC

## 2021-09-02 NOTE — Patient Instructions (Signed)
[facility]   Discharge Instructions: Thank you for choosing [facility] to provide your oncology and hematology care.   If you have a lab appointment with the [facility], please go directly to the [facility] and check in at the registration area.   Wear comfortable clothing and clothing appropriate for easy access to any Portacath or PICC line.   We strive to give you quality time with your provider. You may need to reschedule your appointment if you arrive late (15 or more minutes).  Arriving late affects you and other patients whose appointments are after yours.  Also, if you miss three or more appointments without notifying the office, you may be dismissed from the clinic at the provider's discretion.      For prescription refill requests, have your pharmacy contact our office and allow 72 hours for refills to be completed.    Today you received the following chemotherapy and/or immunotherapy agents Paclitaxel-protein bound (ABRAXANE) & Gemcitabine (GEMZAR).      To help prevent nausea and vomiting after your treatment, we encourage you to take your nausea medication as directed.  BELOW ARE SYMPTOMS THAT SHOULD BE REPORTED IMMEDIATELY: *FEVER GREATER THAN 100.4 F (38 C) OR HIGHER *CHILLS OR SWEATING *NAUSEA AND VOMITING THAT IS NOT CONTROLLED WITH YOUR NAUSEA MEDICATION *UNUSUAL SHORTNESS OF BREATH *UNUSUAL BRUISING OR BLEEDING *URINARY PROBLEMS (pain or burning when urinating, or frequent urination) *BOWEL PROBLEMS (unusual diarrhea, constipation, pain near the anus) TENDERNESS IN MOUTH AND THROAT WITH OR WITHOUT PRESENCE OF ULCERS (sore throat, sores in mouth, or a toothache) UNUSUAL RASH, SWELLING OR PAIN  UNUSUAL VAGINAL DISCHARGE OR ITCHING   Items with * indicate a potential emergency and should be followed up as soon as possible or go to the Emergency Department if any problems should occur.  Please show the CHEMOTHERAPY ALERT  CARD or IMMUNOTHERAPY ALERT CARD at check-in to the Emergency Department and triage nurse.  Should you have questions after your visit or need to cancel or reschedule your appointment, please contact [facility]  Dept: [phone]  and follow the prompts.  Office hours are 8:00 a.m. to 4:30 p.m. Monday - Friday. Please note that voicemails left after 4:00 p.m. may not be returned until the following business day.  We are closed weekends and major holidays. You have access to a nurse at all times for urgent questions. Please call the main number to the clinic Dept: [phone] and follow the prompts.   For any non-urgent questions, you may also contact your provider using MyChart. We now offer e-Visits for anyone 31 and older to request care online for non-urgent symptoms. For details visit [email or web address].   Also download the MyChart app! Go to the app store, search "MyChart", open the app, select [facility], and log in with your MyChart username and password.  Masks are optional in the cancer centers. If you would like for your care team to wear a mask while they are taking care of you, please let them know. For doctor visits, patients may have with them one support person who is at least 76 years old. At this time, visitors are not allowed in the infusion area.  Nanoparticle Albumin-Bound Paclitaxel injection What is this medication? NANOPARTICLE ALBUMIN-BOUND PACLITAXEL (Na no PAHR ti kuhl  al BYOO muhn-bound  PAK li TAX el) is a chemotherapy drug. It targets fast dividing cells, like cancer cells, and causes these cells to die. This  medicine is used to treat advanced breast cancer, lung cancer, and pancreatic cancer. This medicine may be used for other purposes; ask your health care provider or pharmacist if you have questions. COMMON BRAND NAME(S): Abraxane What should I tell my care team before I take this medication? They need to know if you have any of  these conditions: kidney disease liver disease low blood counts, like low white cell, platelet, or red cell counts lung or breathing disease, like asthma tingling of the fingers or toes, or other nerve disorder an unusual or allergic reaction to paclitaxel, albumin, other chemotherapy, other medicines, foods, dyes, or preservatives pregnant or trying to get pregnant breast-feeding How should I use this medication? This drug is given as an infusion into a vein. It is administered in a hospital or clinic by a specially trained health care professional. Talk to your pediatrician regarding the use of this medicine in children. Special care may be needed. Overdosage: If you think you have taken too much of this medicine contact a poison control center or emergency room at once. NOTE: This medicine is only for you. Do not share this medicine with others. What if I miss a dose? It is important not to miss your dose. Call your doctor or health care professional if you are unable to keep an appointment. What may interact with this medication? This medicine may interact with the following medications: antiviral medicines for hepatitis, HIV or AIDS certain antibiotics like erythromycin and clarithromycin certain medicines for fungal infections like ketoconazole and itraconazole certain medicines for seizures like carbamazepine, phenobarbital, phenytoin gemfibrozil nefazodone rifampin St. John's wort This list may not describe all possible interactions. Give your health care provider a list of all the medicines, herbs, non-prescription drugs, or dietary supplements you use. Also tell them if you smoke, drink alcohol, or use illegal drugs. Some items may interact with your medicine. What should I watch for while using this medication? Your condition will be monitored carefully while you are receiving this medicine. You will need important blood work done while you are taking this medicine. This  medicine can cause serious allergic reactions. If you experience allergic reactions like skin rash, itching or hives, swelling of the face, lips, or tongue, tell your doctor or health care professional right away. In some cases, you may be given additional medicines to help with side effects. Follow all directions for their use. This drug may make you feel generally unwell. This is not uncommon, as chemotherapy can affect healthy cells as well as cancer cells. Report any side effects. Continue your course of treatment even though you feel ill unless your doctor tells you to stop. Call your doctor or health care professional for advice if you get a fever, chills or sore throat, or other symptoms of a cold or flu. Do not treat yourself. This drug decreases your body's ability to fight infections. Try to avoid being around people who are sick. This medicine may increase your risk to bruise or bleed. Call your doctor or health care professional if you notice any unusual bleeding. Be careful brushing and flossing your teeth or using a toothpick because you may get an infection or bleed more easily. If you have any dental work done, tell your dentist you are receiving this medicine. Avoid taking products that contain aspirin, acetaminophen, ibuprofen, naproxen, or ketoprofen unless instructed by your doctor. These medicines may hide a fever. Do not become pregnant while taking this medicine or for 6 months after stopping  it. Women should inform their doctor if they wish to become pregnant or think they might be pregnant. Men should not father a child while taking this medicine or for 3 months after stopping it. There is a potential for serious side effects to an unborn child. Talk to your health care professional or pharmacist for more information. Do not breast-feed an infant while taking this medicine or for 2 weeks after stopping it. This medicine may interfere with the ability to get pregnant or to father a  child. You should talk to your doctor or health care professional if you are concerned about your fertility. What side effects may I notice from receiving this medication? Side effects that you should report to your doctor or health care professional as soon as possible: allergic reactions like skin rash, itching or hives, swelling of the face, lips, or tongue breathing problems changes in vision fast, irregular heartbeat low blood pressure mouth sores pain, tingling, numbness in the hands or feet signs of decreased platelets or bleeding - bruising, pinpoint red spots on the skin, black, tarry stools, blood in the urine signs of decreased red blood cells - unusually weak or tired, feeling faint or lightheaded, falls signs of infection - fever or chills, cough, sore throat, pain or difficulty passing urine signs and symptoms of liver injury like dark yellow or brown urine; general ill feeling or flu-like symptoms; light-colored stools; loss of appetite; nausea; right upper belly pain; unusually weak or tired; yellowing of the eyes or skin swelling of the ankles, feet, hands unusually slow heartbeat Side effects that usually do not require medical attention (report to your doctor or health care professional if they continue or are bothersome): diarrhea hair loss loss of appetite nausea, vomiting tiredness This list may not describe all possible side effects. Call your doctor for medical advice about side effects. You may report side effects to FDA at 1-800-FDA-1088. Where should I keep my medication? This drug is given in a hospital or clinic and will not be stored at home. NOTE: This sheet is a summary. It may not cover all possible information. If you have questions about this medicine, talk to your doctor, pharmacist, or health care provider.  2023 Elsevier/Gold Standard (2016-10-25 00:00:00) Gemcitabine injection What is this medication? GEMCITABINE (jem SYE ta been) is a chemotherapy  drug. This medicine is used to treat many types of cancer like breast cancer, lung cancer, pancreatic cancer, and ovarian cancer. This medicine may be used for other purposes; ask your health care provider or pharmacist if you have questions. COMMON BRAND NAME(S): Gemzar, Infugem What should I tell my care team before I take this medication? They need to know if you have any of these conditions: blood disorders infection kidney disease liver disease lung or breathing disease, like asthma recent or ongoing radiation therapy an unusual or allergic reaction to gemcitabine, other chemotherapy, other medicines, foods, dyes, or preservatives pregnant or trying to get pregnant breast-feeding How should I use this medication? This drug is given as an infusion into a vein. It is administered in a hospital or clinic by a specially trained health care professional. Talk to your pediatrician regarding the use of this medicine in children. Special care may be needed. Overdosage: If you think you have taken too much of this medicine contact a poison control center or emergency room at once. NOTE: This medicine is only for you. Do not share this medicine with others. What if I miss a dose? It is  important not to miss your dose. Call your doctor or health care professional if you are unable to keep an appointment. What may interact with this medication? medicines to increase blood counts like filgrastim, pegfilgrastim, sargramostim some other chemotherapy drugs like cisplatin vaccines Talk to your doctor or health care professional before taking any of these medicines: acetaminophen aspirin ibuprofen ketoprofen naproxen This list may not describe all possible interactions. Give your health care provider a list of all the medicines, herbs, non-prescription drugs, or dietary supplements you use. Also tell them if you smoke, drink alcohol, or use illegal drugs. Some items may interact with your  medicine. What should I watch for while using this medication? Visit your doctor for checks on your progress. This drug may make you feel generally unwell. This is not uncommon, as chemotherapy can affect healthy cells as well as cancer cells. Report any side effects. Continue your course of treatment even though you feel ill unless your doctor tells you to stop. In some cases, you may be given additional medicines to help with side effects. Follow all directions for their use. Call your doctor or health care professional for advice if you get a fever, chills or sore throat, or other symptoms of a cold or flu. Do not treat yourself. This drug decreases your body's ability to fight infections. Try to avoid being around people who are sick. This medicine may increase your risk to bruise or bleed. Call your doctor or health care professional if you notice any unusual bleeding. Be careful brushing and flossing your teeth or using a toothpick because you may get an infection or bleed more easily. If you have any dental work done, tell your dentist you are receiving this medicine. Avoid taking products that contain aspirin, acetaminophen, ibuprofen, naproxen, or ketoprofen unless instructed by your doctor. These medicines may hide a fever. Do not become pregnant while taking this medicine or for 6 months after stopping it. Women should inform their doctor if they wish to become pregnant or think they might be pregnant. Men should not father a child while taking this medicine and for 3 months after stopping it. There is a potential for serious side effects to an unborn child. Talk to your health care professional or pharmacist for more information. Do not breast-feed an infant while taking this medicine or for at least 1 week after stopping it. Men should inform their doctors if they wish to father a child. This medicine may lower sperm counts. Talk with your doctor or health care professional if you are concerned  about your fertility. What side effects may I notice from receiving this medication? Side effects that you should report to your doctor or health care professional as soon as possible: allergic reactions like skin rash, itching or hives, swelling of the face, lips, or tongue breathing problems pain, redness, or irritation at site where injected signs and symptoms of a dangerous change in heartbeat or heart rhythm like chest pain; dizziness; fast or irregular heartbeat; palpitations; feeling faint or lightheaded, falls; breathing problems signs of decreased platelets or bleeding - bruising, pinpoint red spots on the skin, black, tarry stools, blood in the urine signs of decreased red blood cells - unusually weak or tired, feeling faint or lightheaded, falls signs of infection - fever or chills, cough, sore throat, pain or difficulty passing urine signs and symptoms of kidney injury like trouble passing urine or change in the amount of urine signs and symptoms of liver injury like dark  yellow or brown urine; general ill feeling or flu-like symptoms; light-colored stools; loss of appetite; nausea; right upper belly pain; unusually weak or tired; yellowing of the eyes or skin swelling of ankles, feet, hands Side effects that usually do not require medical attention (report to your doctor or health care professional if they continue or are bothersome): constipation diarrhea hair loss loss of appetite nausea rash vomiting This list may not describe all possible side effects. Call your doctor for medical advice about side effects. You may report side effects to FDA at 1-800-FDA-1088. Where should I keep my medication? This drug is given in a hospital or clinic and will not be stored at home. NOTE: This sheet is a summary. It may not cover all possible information. If you have questions about this medicine, talk to your doctor, pharmacist, or health care provider.  2023 Elsevier/Gold Standard  (2017-05-17 00:00:00)

## 2021-09-02 NOTE — Progress Notes (Signed)
Patient seen by Lisa Thomas NP today  Vitals are within treatment parameters.  Labs reviewed by Lisa Thomas NP and are within treatment parameters.  Per physician team, patient is ready for treatment and there are NO modifications to the treatment plan.     

## 2021-09-02 NOTE — Progress Notes (Signed)
Spoke with her local [facility] and was informed they can not obtain this drug from their supplier, so not able to provide her out-of-pocket cost. Sent script to [facility] and followed up with phone call. They can use copay card for 1st fill that will cost her $5. Refills will cost $475 out-of-pocket for her. She reports this is cost prohibitive for her. Competed [employer] application for medication and faxed.

## 2021-09-02 NOTE — Progress Notes (Signed)
  [facility] OFFICE PROGRESS NOTE   Diagnosis: Pancreas cancer  INTERVAL HISTORY:   Tonya Jenkins returns as scheduled.  She completed cycle 1 gemcitabine/Abraxane 08/19/2021.  She has continual low-grade nausea which predates the chemotherapy.  There was no increase following chemotherapy.  No mouth sores.  She continues to have frequent small-volume loose stools.  She estimates having a bowel movement every 1-1/2 hours.  She has not started the pancreatic enzyme replacement.  She has not tried Imodium.  No fever or rash after treatment.  She is having difficulty falling asleep.  She previously took hydroxyzine at bedtime with good results.  Objective:  Vital signs in last 24 hours:  Blood pressure 132/68, pulse 80, temperature 98.1 F (36.7 C), temperature source Oral, resp. rate 18, height 5' (1.524 m), weight 118 lb 12.8 oz (53.9 kg), SpO2 100 %.    HEENT: No thrush or ulcers. Resp: Lungs clear bilaterally. Cardio: Regular rate and rhythm. GI: No hepatosplenomegaly.  Midline wound appears healed.  Serous drainage on the gauze dressing. Vascular: No edema.  Left lower leg appears larger than the right lower leg. Skin: No rash. Port-A-Cath without erythema.   Lab Results:  Lab Results  Component Value Date   WBC 4.1 09/02/2021   HGB 9.8 (L) 09/02/2021   HCT 29.9 (L) 09/02/2021   MCV 87.7 09/02/2021   PLT 437 (H) 09/02/2021   NEUTROABS 1.9 09/02/2021    Imaging:  No results found.  Medications: I have reviewed the patient's current medications.  Assessment/Plan: Pancreas cancer, stage III (pT2pN2) CT abdomen/pelvis 05/12/2021-dilated intrahepatic and [place] bile ducts, abrupt narrowing of the common bile duct and pancreatic duct at the level of pancreas head, distended gallbladder MRI/MRCP 05/13/2018 23-3.6 x 2.1 x 4.2 cm enhancing mass in the head of the pancreas with obstruction of the pancreatic duct and common bile duct, no lymphadenopathy, no liver  lesions Internal/external biliary drain 05/14/2021 CT chest 05/14/2021-small right pleural effusion, no evidence of metastatic disease Thoracentesis 05/16/2021-negative cytology Panc[facility]oden[facility] at [facility] 06/22/2021-no evidence of metastatic disease, inflamed gallbladder with percutaneous biliary drain, large pancreas head mass-gallbladder with acute cholecystitis and abscess formation negative for malignancy.  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  She has completed 1 cycle of gemcitabine/Abraxane.  Plan to proceed with cycle 2 today as scheduled.  CBC and chemistry panel reviewed.  Labs adequate to proceed as above.  She continues to have significant diarrhea.  We recommend she begin Imodium now and Creon when she is able to pick it up from the pharmacy.  She will return for lab, follow-up, cycle 3 gemcitabine/Abraxane in 2 weeks.  We are available to see her sooner if needed.    Ned Card ANP/GNP-BC   09/02/2021  9:55 AM

## 2021-09-03 ENCOUNTER — Encounter: Payer: Self-pay | Admitting: *Deleted

## 2021-09-03 NOTE — Progress Notes (Signed)
Faxed application and insurance cards to [employer] [phone].

## 2021-09-08 ENCOUNTER — Telehealth: Payer: Self-pay

## 2021-09-08 NOTE — Telephone Encounter (Signed)
Mrs. Beckett called and stated she needs a support group and a cranial prosthesis. I mailed out mouth of July flyer for cancer support group and prescription.

## 2021-09-09 ENCOUNTER — Telehealth: Payer: Self-pay | Admitting: Dietician

## 2021-09-09 NOTE — Telephone Encounter (Signed)
Nutrition Follow Up:  Called patient on home telephone to follow up on PO intake and weight loss.  She reports she has a good appetite, but doesn't eat a lot.  She has not started taking her Creon yet.  She states that her MD at [facility] told her pancrease should be producing enough enzymes that she should not need replacement TX.  She also reports she is passing stools each hour and they are leave a greasy orange ring.  She has picked up the prescription.  She is awaiting response to see if she will get financial support to help with out of pocket cost.  Medications: Creon ordered 6/29/236  Labs: 09/02/21  K+, Glucose, GFR 54  Anthropometrics:   Weight loss 6.8# (5.44) past 2 weeks  Height: 60" Weight:  09/02/21  118.8# 08/19/21  125.6#  UBW: 145-150 (she has no interest in returning to this weight BMI: 23.2   Estimated Energy Needs  Kcals: 1600 Protein: 54-65 Fluid:  2 L plus losses  NUTRITION DIAGNOSIS: Unintended wt loss related to cancer and associated treatments as evidenced by 30 pound wt loss per patient recall. Weight loss continues.    INTERVENTION: Explained role of pancreatic replacement TX. Encouraged trial of 2 weeks since she already has prescription.    MONITORING, EVALUATION, GOAL: Nutrition impact symptoms, weight trends, PO intake   NEXT VISIT: Telephone follow up 2 weeks  April Manson, RDN, LDN Registered Dietitian, [place] [facility] Part Time Remote (Usual office hours: Tuesday-Thursday) Mobile: [phone] Remote Office: [phone]

## 2021-09-10 ENCOUNTER — Telehealth: Payer: Self-pay

## 2021-09-10 NOTE — Telephone Encounter (Signed)
CSW contacted patient per nurse referral.  Patient requested information on a cranial prosthesis.  CSW mailed her a voucher from the [employer].  She requested additional on-line GI support groups, which CSW provided via her email at [email or web address].  She requested transportation for her treatment next week.  Consulted the Nurse Navigator.  She had already connected patient with Therapist, sports.  CSW provided patient with contact information for [employer].

## 2021-09-11 ENCOUNTER — Other Ambulatory Visit: Payer: Self-pay | Admitting: Oncology

## 2021-09-16 ENCOUNTER — Encounter: Payer: Self-pay | Admitting: *Deleted

## 2021-09-16 ENCOUNTER — Inpatient Hospital Stay: Payer: Medicare Other | Attending: Physician Assistant | Admitting: Oncology

## 2021-09-16 ENCOUNTER — Inpatient Hospital Stay: Payer: Medicare Other

## 2021-09-16 ENCOUNTER — Encounter: Payer: Self-pay | Admitting: Oncology

## 2021-09-16 VITALS — BP 111/68 | HR 92 | Temp 98.1°F | Resp 18 | Ht 60.0 in | Wt 115.0 lb

## 2021-09-16 VITALS — BP 142/75 | HR 67

## 2021-09-16 DIAGNOSIS — C250 Malignant neoplasm of head of pancreas: Secondary | ICD-10-CM

## 2021-09-16 DIAGNOSIS — Z5111 Encounter for antineoplastic chemotherapy: Secondary | ICD-10-CM | POA: Insufficient documentation

## 2021-09-16 DIAGNOSIS — R110 Nausea: Secondary | ICD-10-CM | POA: Insufficient documentation

## 2021-09-16 LAB — CBC WITH DIFFERENTIAL (CANCER CENTER ONLY)
Abs Immature Granulocytes: 0.01 10*3/uL (ref 0.00–0.07)
Basophils Absolute: 0 10*3/uL (ref 0.0–0.1)
Basophils Relative: 1 %
Eosinophils Absolute: 0.4 10*3/uL (ref 0.0–0.5)
Eosinophils Relative: 9 %
HCT: 29 % — ABNORMAL LOW (ref 36.0–46.0)
Hemoglobin: 9.9 g/dL — ABNORMAL LOW (ref 12.0–15.0)
Immature Granulocytes: 0 %
Lymphocytes Relative: 31 %
Lymphs Abs: 1.5 10*3/uL (ref 0.7–4.0)
MCH: 29.3 pg (ref 26.0–34.0)
MCHC: 34.1 g/dL (ref 30.0–36.0)
MCV: 85.8 fL (ref 80.0–100.0)
Monocytes Absolute: 0.6 10*3/uL (ref 0.1–1.0)
Monocytes Relative: 12 %
Neutro Abs: 2.3 10*3/uL (ref 1.7–7.7)
Neutrophils Relative %: 47 %
Platelet Count: 265 10*3/uL (ref 150–400)
RBC: 3.38 MIL/uL — ABNORMAL LOW (ref 3.87–5.11)
RDW: 17.5 % — ABNORMAL HIGH (ref 11.5–15.5)
WBC Count: 4.8 10*3/uL (ref 4.0–10.5)
nRBC: 0 % (ref 0.0–0.2)

## 2021-09-16 LAB — CMP (CANCER CENTER ONLY)
ALT: 33 U/L (ref 0–44)
AST: 36 U/L (ref 15–41)
Albumin: 2.7 g/dL — ABNORMAL LOW (ref 3.5–5.0)
Alkaline Phosphatase: 79 U/L (ref 38–126)
Anion gap: 9 (ref 5–15)
BUN: 12 mg/dL (ref 8–23)
CO2: 28 mmol/L (ref 22–32)
Calcium: 8.4 mg/dL — ABNORMAL LOW (ref 8.9–10.3)
Chloride: 102 mmol/L (ref 98–111)
Creatinine: 0.9 mg/dL (ref 0.44–1.00)
GFR, Estimated: >60 mL/min (ref >60)
Glucose, Bld: 154 mg/dL — ABNORMAL HIGH (ref 70–99)
Potassium: 3.3 mmol/L — ABNORMAL LOW (ref 3.5–5.1)
Sodium: 139 mmol/L (ref 135–145)
Total Bilirubin: 0.5 mg/dL (ref 0.3–1.2)
Total Protein: 5.5 g/dL — ABNORMAL LOW (ref 6.5–8.1)

## 2021-09-16 LAB — MAGNESIUM: Magnesium: 1.5 mg/dL — ABNORMAL LOW (ref 1.7–2.4)

## 2021-09-16 MED ORDER — SODIUM CHLORIDE 0.9 % IV SOLN
1000.0000 mg/m2 | Freq: Once | INTRAVENOUS | Status: AC
  Administered 2021-09-16: 1482 mg via INTRAVENOUS
  Filled 2021-09-16: qty 26.3

## 2021-09-16 MED ORDER — SODIUM CHLORIDE 0.9% FLUSH
10.0000 mL | INTRAVENOUS | Status: DC | PRN
  Administered 2021-09-16: 10 mL

## 2021-09-16 MED ORDER — POTASSIUM CHLORIDE CRYS ER 20 MEQ PO TBCR: 20.0000 meq | EXTENDED_RELEASE_TABLET | Freq: Every day | ORAL | 1 refills | Status: DC

## 2021-09-16 MED ORDER — PROCHLORPERAZINE MALEATE 10 MG PO TABS
10.0000 mg | ORAL_TABLET | Freq: Once | ORAL | Status: AC
  Administered 2021-09-16: 10 mg via ORAL
  Filled 2021-09-16: qty 1

## 2021-09-16 MED ORDER — PACLITAXEL PROTEIN-BOUND CHEMO INJECTION 100 MG
100.0000 mg/m2 | Freq: Once | INTRAVENOUS | Status: AC
  Administered 2021-09-16: 150 mg via INTRAVENOUS
  Filled 2021-09-16: qty 30

## 2021-09-16 MED ORDER — SODIUM CHLORIDE 0.9 % IV SOLN: Freq: Once | INTRAVENOUS | Status: AC

## 2021-09-16 MED ORDER — HEPARIN SOD (PORK) LOCK FLUSH 100 UNIT/ML IV SOLN
500.0000 [IU] | Freq: Once | INTRAVENOUS | Status: AC | PRN
  Administered 2021-09-16: 500 [IU]

## 2021-09-16 NOTE — Progress Notes (Signed)
Patient seen by Dr. Benay Spice today  Vitals are within treatment parameters.  Labs reviewed by Dr. Benay Spice and are within treatment parameters. K+ = 3.3. Will start 20 meq daily at home.  Per physician team, patient is ready for treatment and there are NO modifications to the treatment plan.

## 2021-09-16 NOTE — Progress Notes (Signed)
  [place] OFFICE PROGRESS NOTE   Diagnosis: Pancreas cancer  INTERVAL HISTORY:   [name] returns as scheduled.  She completed another cycle of gemcitabine/Abraxane on 09/02/2021.  No fever, rash, or neuropathy symptoms.  She has chronic nausea.  She continues to have diarrhea.  She began Creon yesterday.  The abdominal wound continues to drain.  Objective:  Vital signs in last 24 hours:  Blood pressure 111/68, pulse 92, temperature 98.1 F (36.7 C), temperature source Oral, resp. rate 18, height 5' (1.524 m), weight 115 lb (52.2 kg), SpO2 100 %.    HEENT: No thrush or ulcers Resp: Lungs clear bilaterally Cardio: Regular rate and rhythm GI: No hepatosplenomegaly, nontender, serous drainage from a 3-4 mm opening at the inferior aspect of the midline wound, no surrounding erythema Vascular: No leg edema  Portacath/PICC-without erythema  Lab Results:  Lab Results  Component Value Date   WBC 4.8 09/16/2021   HGB 9.9 (L) 09/16/2021   HCT 29.0 (L) 09/16/2021   MCV 85.8 09/16/2021   PLT 265 09/16/2021   NEUTROABS 2.3 09/16/2021    CMP  Lab Results  Component Value Date   NA 138 09/02/2021   K 3.4 (L) 09/02/2021   CL 101 09/02/2021   CO2 28 09/02/2021   GLUCOSE 172 (H) 09/02/2021   BUN 11 09/02/2021   CREATININE 1.08 (H) 09/02/2021   CALCIUM 8.3 (L) 09/02/2021   PROT 5.9 (L) 09/02/2021   ALBUMIN 2.6 (L) 09/02/2021   AST 35 09/02/2021   ALT 30 09/02/2021   ALKPHOS 78 09/02/2021   BILITOT 0.4 09/02/2021   GFRNONAA 54 (L) 09/02/2021    Lab Results  Component Value Date   CAN199 <2 05/12/2021    Medications: I have reviewed the patient's current medications.   Assessment/Plan: Pancreas cancer, stage III (pT2pN2) CT abdomen/pelvis 05/12/2021-dilated intrahepatic and [place] bile ducts, abrupt narrowing of the common bile duct and pancreatic duct at the level of pancreas head, distended gallbladder MRI/MRCP 05/13/2018 23-3.6 x 2.1 x 4.2 cm  enhancing mass in the head of the pancreas with obstruction of the pancreatic duct and common bile duct, no lymphadenopathy, no liver lesions Internal/external biliary drain 05/14/2021 CT chest 05/14/2021-small right pleural effusion, no evidence of metastatic disease Thoracentesis 05/16/2021-negative cytology Panc[facility]ectomy at [facility] 06/22/2021-no evidence of metastatic disease, inflamed gallbladder with percutaneous biliary drain, large pancreas head mass-gallbladder with acute cholecystitis and abscess formation negative for malignancy.  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, negative resection margins, 6/36 lymph nodes with metastatic adenocarcinoma, lymphovascular and perineural invasion CT angio chest and CT abdomen/pelvis 08/04/2021-negative for pulmonary embolism, status post Whipple procedure, 11 mm soft tissue nodule along the jejunal mesentery-nonspecific Cycle 1 gemcitabine/Abraxane 08/19/2021 Cycle 2 gemcitabine/Abraxane 09/02/2021 Cycle 3 gemcitabine/Abraxane 09/16/2021 Postoperative wound dehiscence-status post fascial closure and placement of a wound VAC 07/02/2021 Chronic numbness of the right lower leg and foot Hypertension Left leg edema-negative bilateral venous Doppler 08/19/2021 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  She has tolerated the chemotherapy well.  She will complete cycle 3 today.  I encouraged her to continue the Creon for diarrhea.  She will call if this does not relieve the diarrhea.  Her blood pressure is low.  I recommended she hold the olmesartan  She will see Dr. Zenia Jenkins to evaluate the midline wound.  Betsy Coder, MD  09/16/2021  8:48 AM

## 2021-09-16 NOTE — Patient Instructions (Signed)
[place]   Discharge Instructions: HOLD OLMESARTAN Thank you for choosing [facility] to provide your oncology and hematology care.   If you have a lab appointment with the [facility], please go directly to the [facility] and check in at the registration area.   Wear comfortable clothing and clothing appropriate for easy access to any Portacath or PICC line.   We strive to give you quality time with your provider. You may need to reschedule your appointment if you arrive late (15 or more minutes).  Arriving late affects you and other patients whose appointments are after yours.  Also, if you miss three or more appointments without notifying the office, you may be dismissed from the clinic at the provider's discretion.      For prescription refill requests, have your pharmacy contact our office and allow 72 hours for refills to be completed.    Today you received the following chemotherapy and/or immunotherapy agents Abraxane,  Gemzar      To help prevent nausea and vomiting after your treatment, we encourage you to take your nausea medication as directed.  BELOW ARE SYMPTOMS THAT SHOULD BE REPORTED IMMEDIATELY: *FEVER GREATER THAN 100.4 F (38 C) OR HIGHER *CHILLS OR SWEATING *NAUSEA AND VOMITING THAT IS NOT CONTROLLED WITH YOUR NAUSEA MEDICATION *UNUSUAL SHORTNESS OF BREATH *UNUSUAL BRUISING OR BLEEDING *URINARY PROBLEMS (pain or burning when urinating, or frequent urination) *BOWEL PROBLEMS (unusual diarrhea, constipation, pain near the anus) TENDERNESS IN MOUTH AND THROAT WITH OR WITHOUT PRESENCE OF ULCERS (sore throat, sores in mouth, or a toothache) UNUSUAL RASH, SWELLING OR PAIN  UNUSUAL VAGINAL DISCHARGE OR ITCHING   Items with * indicate a potential emergency and should be followed up as soon as possible or go to the Emergency Department if any problems should occur.  Please show the CHEMOTHERAPY ALERT CARD or IMMUNOTHERAPY  ALERT CARD at check-in to the Emergency Department and triage nurse.  Should you have questions after your visit or need to cancel or reschedule your appointment, please contact [facility]  Dept: [phone]  and follow the prompts.  Office hours are 8:00 a.m. to 4:30 p.m. Monday - Friday. Please note that voicemails left after 4:00 p.m. may not be returned until the following business day.  We are closed weekends and major holidays. You have access to a nurse at all times for urgent questions. Please call the main number to the clinic Dept: [phone] and follow the prompts.   For any non-urgent questions, you may also contact your provider using MyChart. We now offer e-Visits for anyone 20 and older to request care online for non-urgent symptoms. For details visit [email or web address].   Also download the MyChart app! Go to the app store, search "MyChart", open the app, select [facility], and log in with your MyChart username and password.  Masks are optional in the cancer centers. If you would like for your care team to wear a mask while they are taking care of you, please let them know. For doctor visits, patients may have with them one support person who is at least 75 years old. At this time, visitors are not allowed in the infusion area.  Nanoparticle Albumin-Bound Paclitaxel injection What is this medication? NANOPARTICLE ALBUMIN-BOUND PACLITAXEL (Na no PAHR ti kuhl  al BYOO muhn-bound  PAK li TAX el) is a chemotherapy drug. It targets fast dividing cells, like cancer cells, and causes these cells to die. This medicine  is used to treat advanced breast cancer, lung cancer, and pancreatic cancer. This medicine may be used for other purposes; ask your health care provider or pharmacist if you have questions. COMMON BRAND NAME(S): Abraxane What should I tell my care team before I take this medication? They need to know if you have any of these  conditions: kidney disease liver disease low blood counts, like low white cell, platelet, or red cell counts lung or breathing disease, like asthma tingling of the fingers or toes, or other nerve disorder an unusual or allergic reaction to paclitaxel, albumin, other chemotherapy, other medicines, foods, dyes, or preservatives pregnant or trying to get pregnant breast-feeding How should I use this medication? This drug is given as an infusion into a vein. It is administered in a hospital or clinic by a specially trained health care professional. Talk to your pediatrician regarding the use of this medicine in children. Special care may be needed. Overdosage: If you think you have taken too much of this medicine contact a poison control center or emergency room at once. NOTE: This medicine is only for you. Do not share this medicine with others. What if I miss a dose? It is important not to miss your dose. Call your doctor or health care professional if you are unable to keep an appointment. What may interact with this medication? This medicine may interact with the following medications: antiviral medicines for hepatitis, HIV or AIDS certain antibiotics like erythromycin and clarithromycin certain medicines for fungal infections like ketoconazole and itraconazole certain medicines for seizures like carbamazepine, phenobarbital, phenytoin gemfibrozil nefazodone rifampin St. John's wort This list may not describe all possible interactions. Give your health care provider a list of all the medicines, herbs, non-prescription drugs, or dietary supplements you use. Also tell them if you smoke, drink alcohol, or use illegal drugs. Some items may interact with your medicine. What should I watch for while using this medication? Your condition will be monitored carefully while you are receiving this medicine. You will need important blood work done while you are taking this medicine. This medicine  can cause serious allergic reactions. If you experience allergic reactions like skin rash, itching or hives, swelling of the face, lips, or tongue, tell your doctor or health care professional right away. In some cases, you may be given additional medicines to help with side effects. Follow all directions for their use. This drug may make you feel generally unwell. This is not uncommon, as chemotherapy can affect healthy cells as well as cancer cells. Report any side effects. Continue your course of treatment even though you feel ill unless your doctor tells you to stop. Call your doctor or health care professional for advice if you get a fever, chills or sore throat, or other symptoms of a cold or flu. Do not treat yourself. This drug decreases your body's ability to fight infections. Try to avoid being around people who are sick. This medicine may increase your risk to bruise or bleed. Call your doctor or health care professional if you notice any unusual bleeding. Be careful brushing and flossing your teeth or using a toothpick because you may get an infection or bleed more easily. If you have any dental work done, tell your dentist you are receiving this medicine. Avoid taking products that contain aspirin, acetaminophen, ibuprofen, naproxen, or ketoprofen unless instructed by your doctor. These medicines may hide a fever. Do not become pregnant while taking this medicine or for 6 months after stopping it.  Women should inform their doctor if they wish to become pregnant or think they might be pregnant. Men should not father a child while taking this medicine or for 3 months after stopping it. There is a potential for serious side effects to an unborn child. Talk to your health care professional or pharmacist for more information. Do not breast-feed an infant while taking this medicine or for 2 weeks after stopping it. This medicine may interfere with the ability to get pregnant or to father a child. You  should talk to your doctor or health care professional if you are concerned about your fertility. What side effects may I notice from receiving this medication? Side effects that you should report to your doctor or health care professional as soon as possible: allergic reactions like skin rash, itching or hives, swelling of the face, lips, or tongue breathing problems changes in vision fast, irregular heartbeat low blood pressure mouth sores pain, tingling, numbness in the hands or feet signs of decreased platelets or bleeding - bruising, pinpoint red spots on the skin, black, tarry stools, blood in the urine signs of decreased red blood cells - unusually weak or tired, feeling faint or lightheaded, falls signs of infection - fever or chills, cough, sore throat, pain or difficulty passing urine signs and symptoms of liver injury like dark yellow or brown urine; general ill feeling or flu-like symptoms; light-colored stools; loss of appetite; nausea; right upper belly pain; unusually weak or tired; yellowing of the eyes or skin swelling of the ankles, feet, hands unusually slow heartbeat Side effects that usually do not require medical attention (report to your doctor or health care professional if they continue or are bothersome): diarrhea hair loss loss of appetite nausea, vomiting tiredness This list may not describe all possible side effects. Call your doctor for medical advice about side effects. You may report side effects to FDA at 1-800-FDA-1088. Where should I keep my medication? This drug is given in a hospital or clinic and will not be stored at home. NOTE: This sheet is a summary. It may not cover all possible information. If you have questions about this medicine, talk to your doctor, pharmacist, or health care provider.  2023 Elsevier/Gold Standard (2016-10-25 00:00:00) Gemcitabine injection What is this medication? GEMCITABINE (jem SYE ta been) is a chemotherapy drug. This  medicine is used to treat many types of cancer like breast cancer, lung cancer, pancreatic cancer, and ovarian cancer. This medicine may be used for other purposes; ask your health care provider or pharmacist if you have questions. COMMON BRAND NAME(S): Gemzar, Infugem What should I tell my care team before I take this medication? They need to know if you have any of these conditions: blood disorders infection kidney disease liver disease lung or breathing disease, like asthma recent or ongoing radiation therapy an unusual or allergic reaction to gemcitabine, other chemotherapy, other medicines, foods, dyes, or preservatives pregnant or trying to get pregnant breast-feeding How should I use this medication? This drug is given as an infusion into a vein. It is administered in a hospital or clinic by a specially trained health care professional. Talk to your pediatrician regarding the use of this medicine in children. Special care may be needed. Overdosage: If you think you have taken too much of this medicine contact a poison control center or emergency room at once. NOTE: This medicine is only for you. Do not share this medicine with others. What if I miss a dose? It is important  not to miss your dose. Call your doctor or health care professional if you are unable to keep an appointment. What may interact with this medication? medicines to increase blood counts like filgrastim, pegfilgrastim, sargramostim some other chemotherapy drugs like cisplatin vaccines Talk to your doctor or health care professional before taking any of these medicines: acetaminophen aspirin ibuprofen ketoprofen naproxen This list may not describe all possible interactions. Give your health care provider a list of all the medicines, herbs, non-prescription drugs, or dietary supplements you use. Also tell them if you smoke, drink alcohol, or use illegal drugs. Some items may interact with your medicine. What  should I watch for while using this medication? Visit your doctor for checks on your progress. This drug may make you feel generally unwell. This is not uncommon, as chemotherapy can affect healthy cells as well as cancer cells. Report any side effects. Continue your course of treatment even though you feel ill unless your doctor tells you to stop. In some cases, you may be given additional medicines to help with side effects. Follow all directions for their use. Call your doctor or health care professional for advice if you get a fever, chills or sore throat, or other symptoms of a cold or flu. Do not treat yourself. This drug decreases your body's ability to fight infections. Try to avoid being around people who are sick. This medicine may increase your risk to bruise or bleed. Call your doctor or health care professional if you notice any unusual bleeding. Be careful brushing and flossing your teeth or using a toothpick because you may get an infection or bleed more easily. If you have any dental work done, tell your dentist you are receiving this medicine. Avoid taking products that contain aspirin, acetaminophen, ibuprofen, naproxen, or ketoprofen unless instructed by your doctor. These medicines may hide a fever. Do not become pregnant while taking this medicine or for 6 months after stopping it. Women should inform their doctor if they wish to become pregnant or think they might be pregnant. Men should not father a child while taking this medicine and for 3 months after stopping it. There is a potential for serious side effects to an unborn child. Talk to your health care professional or pharmacist for more information. Do not breast-feed an infant while taking this medicine or for at least 1 week after stopping it. Men should inform their doctors if they wish to father a child. This medicine may lower sperm counts. Talk with your doctor or health care professional if you are concerned about your  fertility. What side effects may I notice from receiving this medication? Side effects that you should report to your doctor or health care professional as soon as possible: allergic reactions like skin rash, itching or hives, swelling of the face, lips, or tongue breathing problems pain, redness, or irritation at site where injected signs and symptoms of a dangerous change in heartbeat or heart rhythm like chest pain; dizziness; fast or irregular heartbeat; palpitations; feeling faint or lightheaded, falls; breathing problems signs of decreased platelets or bleeding - bruising, pinpoint red spots on the skin, black, tarry stools, blood in the urine signs of decreased red blood cells - unusually weak or tired, feeling faint or lightheaded, falls signs of infection - fever or chills, cough, sore throat, pain or difficulty passing urine signs and symptoms of kidney injury like trouble passing urine or change in the amount of urine signs and symptoms of liver injury like dark yellow  or brown urine; general ill feeling or flu-like symptoms; light-colored stools; loss of appetite; nausea; right upper belly pain; unusually weak or tired; yellowing of the eyes or skin swelling of ankles, feet, hands Side effects that usually do not require medical attention (report to your doctor or health care professional if they continue or are bothersome): constipation diarrhea hair loss loss of appetite nausea rash vomiting This list may not describe all possible side effects. Call your doctor for medical advice about side effects. You may report side effects to FDA at 1-800-FDA-1088. Where should I keep my medication? This drug is given in a hospital or clinic and will not be stored at home. NOTE: This sheet is a summary. It may not cover all possible information. If you have questions about this medicine, talk to your doctor, pharmacist, or health care provider.  2023 Elsevier/Gold Standard (2017-05-17  00:00:00)

## 2021-09-17 ENCOUNTER — Other Ambulatory Visit: Payer: Self-pay

## 2021-09-17 MED ORDER — MAGNESIUM OXIDE -MG SUPPLEMENT 400 (240 MG) MG PO TABS: 400.0000 mg | ORAL_TABLET | Freq: Two times a day (BID) | ORAL | 0 refills | Status: DC

## 2021-09-21 ENCOUNTER — Telehealth: Payer: Self-pay

## 2021-09-21 ENCOUNTER — Other Ambulatory Visit: Payer: Self-pay

## 2021-09-21 DIAGNOSIS — C250 Malignant neoplasm of head of pancreas: Secondary | ICD-10-CM

## 2021-09-21 MED ORDER — POTASSIUM CHLORIDE CRYS ER 20 MEQ PO TBCR: 20.0000 meq | EXTENDED_RELEASE_TABLET | Freq: Every day | ORAL | 1 refills | Status: DC

## 2021-09-21 NOTE — Telephone Encounter (Signed)
-----   Message from [name], MD sent at 09/16/2021  5:36 PM EDT ----- Please call patient, the magnesium level is mildly low, start magnesium oxide 400 mg twice daily, repeat magnesium level with next lab

## 2021-09-21 NOTE — Telephone Encounter (Signed)
Addendum 09/17/21 Medication sent to pharmacy. Pt verbalized understanding

## 2021-09-22 ENCOUNTER — Inpatient Hospital Stay: Payer: Medicare Other | Admitting: Dietician

## 2021-09-22 NOTE — Progress Notes (Signed)
Nutrition Follow Up:  She has been taking the Creon now for 3 days.   She has noted stools still greasy still having watery foamy stools every 2 hours including when she sleeps. .  Appetite hasn't changed, still only tolerating small volume of food until she has pain.  She usually doesn't drink fluids with meals.    Small serving rice checks with whole milk 1/2 peach  Eggplant parm 3 hours later little salad  Pimento cheese sandwich with sliced tomato (1/2 sand) Coffee 1 cup Water  16-24 oz Gingerale diluted with club 8 oz     Medications: currently not taking any anti diarrheal meds, just started taking a probiotic, Mg, (started yesterday)  KCL   Labs: 09/16/21  K+, 3.3, Mg 1.5, Hgb 9.9 (  Anthropometrics:   Weight loss 10# (8%) past month   Height: 60" Weight:  09/16/21  115# 09/02/21  118.8# 08/19/21  125.6#   UBW: 145-150 (she has no interest in returning to this weight BMI: 22.46     Estimated Energy Needs   Kcals: 1600 Protein: 54-65 Fluid:  2 L plus losses   NUTRITION DIAGNOSIS: Unintended wt loss related to cancer and associated treatments as evidenced by 30 pound wt loss per patient recall. Weight loss continues.     INTERVENTION: Encouraged increasing calories.  Willing to trial Sprouts protein drink once daily, 1/2 cup whole milk with meals, diluted OJ w/1/2 teaspoon salt sipped after each bowel movement.  Will have Pineapple Banatrol packets, and Pedialyte packets samples sent for her to pick up next week when she is in to see MD.    MONITORING, EVALUATION, GOAL: Nutrition impact symptoms, weight trends, PO intake Patient's goal is weight maintenance.    NEXT VISIT: Telephone follow up 2 weeks   April Manson, RDN, LDN Registered Dietitian, [facility] Part Time Remote (Usual office hours: Tuesday-Thursday) Mobile: [phone]

## 2021-09-23 ENCOUNTER — Encounter: Payer: Medicare Other | Admitting: Dietician

## 2021-09-26 ENCOUNTER — Other Ambulatory Visit: Payer: Self-pay | Admitting: Oncology

## 2021-09-27 ENCOUNTER — Other Ambulatory Visit: Payer: Self-pay

## 2021-09-30 ENCOUNTER — Inpatient Hospital Stay (HOSPITAL_BASED_OUTPATIENT_CLINIC_OR_DEPARTMENT_OTHER): Payer: Medicare Other | Admitting: Oncology

## 2021-09-30 ENCOUNTER — Inpatient Hospital Stay: Payer: Medicare Other

## 2021-09-30 ENCOUNTER — Encounter: Payer: Self-pay | Admitting: *Deleted

## 2021-09-30 ENCOUNTER — Other Ambulatory Visit: Payer: Self-pay

## 2021-09-30 VITALS — BP 146/78 | HR 77 | Temp 98.2°F | Resp 18 | Ht 60.0 in | Wt 116.0 lb

## 2021-09-30 DIAGNOSIS — C250 Malignant neoplasm of head of pancreas: Secondary | ICD-10-CM

## 2021-09-30 DIAGNOSIS — Z5111 Encounter for antineoplastic chemotherapy: Secondary | ICD-10-CM | POA: Diagnosis not present

## 2021-09-30 LAB — CMP (CANCER CENTER ONLY)
ALT: 34 U/L (ref 0–44)
AST: 34 U/L (ref 15–41)
Albumin: 3 g/dL — ABNORMAL LOW (ref 3.5–5.0)
Alkaline Phosphatase: 81 U/L (ref 38–126)
Anion gap: 9 (ref 5–15)
BUN: 13 mg/dL (ref 8–23)
CO2: 27 mmol/L (ref 22–32)
Calcium: 8.7 mg/dL — ABNORMAL LOW (ref 8.9–10.3)
Chloride: 100 mmol/L (ref 98–111)
Creatinine: 0.86 mg/dL (ref 0.44–1.00)
GFR, Estimated: >60 mL/min (ref >60)
Glucose, Bld: 127 mg/dL — ABNORMAL HIGH (ref 70–99)
Potassium: 4.1 mmol/L (ref 3.5–5.1)
Sodium: 136 mmol/L (ref 135–145)
Total Bilirubin: 0.4 mg/dL (ref 0.3–1.2)
Total Protein: 5.8 g/dL — ABNORMAL LOW (ref 6.5–8.1)

## 2021-09-30 LAB — CBC WITH DIFFERENTIAL (CANCER CENTER ONLY)
Abs Immature Granulocytes: 0.02 10*3/uL (ref 0.00–0.07)
Basophils Absolute: 0.1 10*3/uL (ref 0.0–0.1)
Basophils Relative: 1 %
Eosinophils Absolute: 0.9 10*3/uL — ABNORMAL HIGH (ref 0.0–0.5)
Eosinophils Relative: 17 %
HCT: 27.5 % — ABNORMAL LOW (ref 36.0–46.0)
Hemoglobin: 9.2 g/dL — ABNORMAL LOW (ref 12.0–15.0)
Immature Granulocytes: 0 %
Lymphocytes Relative: 21 %
Lymphs Abs: 1.1 10*3/uL (ref 0.7–4.0)
MCH: 29.5 pg (ref 26.0–34.0)
MCHC: 33.5 g/dL (ref 30.0–36.0)
MCV: 88.1 fL (ref 80.0–100.0)
Monocytes Absolute: 0.9 10*3/uL (ref 0.1–1.0)
Monocytes Relative: 18 %
Neutro Abs: 2.2 10*3/uL (ref 1.7–7.7)
Neutrophils Relative %: 43 %
Platelet Count: 513 10*3/uL — ABNORMAL HIGH (ref 150–400)
RBC: 3.12 MIL/uL — ABNORMAL LOW (ref 3.87–5.11)
RDW: 19.5 % — ABNORMAL HIGH (ref 11.5–15.5)
WBC Count: 5.1 10*3/uL (ref 4.0–10.5)
nRBC: 0 % (ref 0.0–0.2)

## 2021-09-30 MED ORDER — HEPARIN SOD (PORK) LOCK FLUSH 100 UNIT/ML IV SOLN
500.0000 [IU] | Freq: Once | INTRAVENOUS | Status: AC | PRN
  Administered 2021-09-30: 500 [IU]

## 2021-09-30 MED ORDER — PROCHLORPERAZINE MALEATE 10 MG PO TABS
10.0000 mg | ORAL_TABLET | Freq: Once | ORAL | Status: AC
  Administered 2021-09-30: 10 mg via ORAL
  Filled 2021-09-30: qty 1

## 2021-09-30 MED ORDER — SODIUM CHLORIDE 0.9 % IV SOLN
1000.0000 mg/m2 | Freq: Once | INTRAVENOUS | Status: AC
  Administered 2021-09-30: 1482 mg via INTRAVENOUS
  Filled 2021-09-30: qty 26.3

## 2021-09-30 MED ORDER — SODIUM CHLORIDE 0.9% FLUSH
10.0000 mL | INTRAVENOUS | Status: DC | PRN
  Administered 2021-09-30: 10 mL

## 2021-09-30 MED ORDER — SODIUM CHLORIDE 0.9 % IV SOLN: Freq: Once | INTRAVENOUS | Status: AC

## 2021-09-30 MED ORDER — POTASSIUM CHLORIDE ER 10 MEQ PO CPCR: 10.0000 meq | ORAL_CAPSULE | Freq: Two times a day (BID) | ORAL | 1 refills | Status: DC

## 2021-09-30 MED ORDER — PACLITAXEL PROTEIN-BOUND CHEMO INJECTION 100 MG
100.0000 mg/m2 | Freq: Once | INTRAVENOUS | Status: AC
  Administered 2021-09-30: 150 mg via INTRAVENOUS
  Filled 2021-09-30: qty 30

## 2021-09-30 NOTE — Progress Notes (Signed)
Patient seen by Dr. Sherrill today ? ?Vitals are within treatment parameters. ? ?Labs reviewed by Dr. Sherrill and are within treatment parameters. ? ?Per physician team, patient is ready for treatment and there are NO modifications to the treatment plan.  ?

## 2021-09-30 NOTE — Progress Notes (Signed)
[facility] OFFICE PROGRESS NOTE   Diagnosis: Pancreas cancer  INTERVAL HISTORY:   Tonya Jenkins returns as scheduled.  She completed another cycle of gemcitabine/Abraxane 09/16/2021.  No rash, fever, or neuropathy symptoms.  She has chronic nausea and diarrhea since undergoing pancreas surgery.  She is not taking nausea medication.  She takes Creon but has not tried Imodium for diarrhea.  She has difficulty swallowing the potassium tablet.  She has noted bruising at the dorsum of the hands and lower arms.  No other bleeding.  Objective:  Vital signs in last 24 hours:  Blood pressure (!) 146/78, pulse 77, temperature 98.2 F (36.8 C), temperature source Oral, resp. rate 18, height 5' (1.524 m), weight 116 lb (52.6 kg), SpO2 99 %.    HEENT: No thrush or ulcers Resp: Lungs clear bilaterally Cardio: Regular rate and rhythm GI: No hepatosplenomegaly, no apparent ascites, mild tenderness at the mid and right upper abdomen Vascular: No leg edema  Skin: Small ecchymoses at the dorsum of the hands and lower forearm bilaterally  Portacath/PICC-without erythema  Lab Results:  Lab Results  Component Value Date   WBC 5.1 09/30/2021   HGB 9.2 (L) 09/30/2021   HCT 27.5 (L) 09/30/2021   MCV 88.1 09/30/2021   PLT 513 (H) 09/30/2021   NEUTROABS 2.2 09/30/2021    CMP  Lab Results  Component Value Date   NA 139 09/16/2021   K 3.3 (L) 09/16/2021   CL 102 09/16/2021   CO2 28 09/16/2021   GLUCOSE 154 (H) 09/16/2021   BUN 12 09/16/2021   CREATININE 0.90 09/16/2021   CALCIUM 8.4 (L) 09/16/2021   PROT 5.5 (L) 09/16/2021   ALBUMIN 2.7 (L) 09/16/2021   AST 36 09/16/2021   ALT 33 09/16/2021   ALKPHOS 79 09/16/2021   BILITOT 0.5 09/16/2021   GFRNONAA >60 09/16/2021    Lab Results  Component Value Date   CAN199 <2 05/12/2021   Medications: I have reviewed the patient's current medications.   Assessment/Plan: Pancreas cancer, stage III (pT2pN2) CT abdomen/pelvis  05/12/2021-dilated intrahepatic and [place] bile ducts, abrupt narrowing of the common bile duct and pancreatic duct at the level of pancreas head, distended gallbladder MRI/MRCP 05/13/2018 23-3.6 x 2.1 x 4.2 cm enhancing mass in the head of the pancreas with obstruction of the pancreatic duct and common bile duct, no lymphadenopathy, no liver lesions Internal/external biliary drain 05/14/2021 CT chest 05/14/2021-small right pleural effusion, no evidence of metastatic disease Thoracentesis 05/16/2021-negative cytology Panc[facility] at [facility] 06/22/2021-no evidence of metastatic disease, inflamed gallbladder with percutaneous biliary drain, large pancreas head mass-gallbladder with acute cholecystitis and abscess formation negative for malignancy.  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, negative resection margins, 6/36 lymph nodes with metastatic adenocarcinoma, lymphovascular and perineural invasion CT angio chest and CT abdomen/pelvis 08/04/2021-negative for pulmonary embolism, status post Whipple procedure, 11 mm soft tissue nodule along the jejunal mesentery-nonspecific Cycle 1 gemcitabine/Abraxane 08/19/2021 Cycle 2 gemcitabine/Abraxane 09/02/2021 Cycle 3 gemcitabine/Abraxane 09/16/2021 Cycle 4 gemcitabine/Abraxane 09/30/2021 Postoperative wound dehiscence-status post fascial closure and placement of a wound VAC 07/02/2021 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  She has tolerated the treatment well.  He continues to have diarrhea.  I recommended she try Imodium in addition to Creon.  She will try ondansetron and Compazine as needed for nausea.  We  changed the potassium supplement to "Micro-K ".  She will complete another cycle of  gemcitabine/Abraxane today.  She will return for an office visit and chemotherapy in 2 weeks.  Betsy Coder, MD  09/30/2021  10:39 AM

## 2021-09-30 NOTE — Patient Instructions (Signed)
[facility]   Discharge Instructions: Thank you for choosing [facility] to provide your oncology and hematology care.   If you have a lab appointment with the [facility], please go directly to the [facility] and check in at the registration area.   Wear comfortable clothing and clothing appropriate for easy access to any Portacath or PICC line.   We strive to give you quality time with your provider. You may need to reschedule your appointment if you arrive late (15 or more minutes).  Arriving late affects you and other patients whose appointments are after yours.  Also, if you miss three or more appointments without notifying the office, you may be dismissed from the clinic at the provider's discretion.      For prescription refill requests, have your pharmacy contact our office and allow 72 hours for refills to be completed.    Today you received the following chemotherapy and/or immunotherapy agents Abraxane, Gemzar      To help prevent nausea and vomiting after your treatment, we encourage you to take your nausea medication as directed.  BELOW ARE SYMPTOMS THAT SHOULD BE REPORTED IMMEDIATELY: *FEVER GREATER THAN 100.4 F (38 C) OR HIGHER *CHILLS OR SWEATING *NAUSEA AND VOMITING THAT IS NOT CONTROLLED WITH YOUR NAUSEA MEDICATION *UNUSUAL SHORTNESS OF BREATH *UNUSUAL BRUISING OR BLEEDING *URINARY PROBLEMS (pain or burning when urinating, or frequent urination) *BOWEL PROBLEMS (unusual diarrhea, constipation, pain near the anus) TENDERNESS IN MOUTH AND THROAT WITH OR WITHOUT PRESENCE OF ULCERS (sore throat, sores in mouth, or a toothache) UNUSUAL RASH, SWELLING OR PAIN  UNUSUAL VAGINAL DISCHARGE OR ITCHING   Items with * indicate a potential emergency and should be followed up as soon as possible or go to the Emergency Department if any problems should occur.  Please show the CHEMOTHERAPY ALERT CARD or IMMUNOTHERAPY ALERT CARD at  check-in to the Emergency Department and triage nurse.  Should you have questions after your visit or need to cancel or reschedule your appointment, please contact [facility]  Dept: [phone]  and follow the prompts.  Office hours are 8:00 a.m. to 4:30 p.m. Monday - Friday. Please note that voicemails left after 4:00 p.m. may not be returned until the following business day.  We are closed weekends and major holidays. You have access to a nurse at all times for urgent questions. Please call the main number to the clinic Dept: [phone] and follow the prompts.   For any non-urgent questions, you may also contact your provider using MyChart. We now offer e-Visits for anyone 50 and older to request care online for non-urgent symptoms. For details visit [email or web address].   Also download the MyChart app! Go to the app store, search "MyChart", open the app, select [facility], and log in with your MyChart username and password.  Masks are optional in the cancer centers. If you would like for your care team to wear a mask while they are taking care of you, please let them know. For doctor visits, patients may have with them one support person who is at least 76 years old. At this time, visitors are not allowed in the infusion area.  Nanoparticle Albumin-Bound Paclitaxel injection What is this medication? NANOPARTICLE ALBUMIN-BOUND PACLITAXEL (Na no PAHR ti kuhl  al BYOO muhn-bound  PAK li TAX el) is a chemotherapy drug. It targets fast dividing cells, like cancer cells, and causes these cells to die. This medicine is used to  treat advanced breast cancer, lung cancer, and pancreatic cancer. This medicine may be used for other purposes; ask your health care provider or pharmacist if you have questions. COMMON BRAND NAME(S): Abraxane What should I tell my care team before I take this medication? They need to know if you have any of these conditions: kidney  disease liver disease low blood counts, like low white cell, platelet, or red cell counts lung or breathing disease, like asthma tingling of the fingers or toes, or other nerve disorder an unusual or allergic reaction to paclitaxel, albumin, other chemotherapy, other medicines, foods, dyes, or preservatives pregnant or trying to get pregnant breast-feeding How should I use this medication? This drug is given as an infusion into a vein. It is administered in a hospital or clinic by a specially trained health care professional. Talk to your pediatrician regarding the use of this medicine in children. Special care may be needed. Overdosage: If you think you have taken too much of this medicine contact a poison control center or emergency room at once. NOTE: This medicine is only for you. Do not share this medicine with others. What if I miss a dose? It is important not to miss your dose. Call your doctor or health care professional if you are unable to keep an appointment. What may interact with this medication? This medicine may interact with the following medications: antiviral medicines for hepatitis, HIV or AIDS certain antibiotics like erythromycin and clarithromycin certain medicines for fungal infections like ketoconazole and itraconazole certain medicines for seizures like carbamazepine, phenobarbital, phenytoin gemfibrozil nefazodone rifampin St. John's wort This list may not describe all possible interactions. Give your health care provider a list of all the medicines, herbs, non-prescription drugs, or dietary supplements you use. Also tell them if you smoke, drink alcohol, or use illegal drugs. Some items may interact with your medicine. What should I watch for while using this medication? Your condition will be monitored carefully while you are receiving this medicine. You will need important blood work done while you are taking this medicine. This medicine can cause serious  allergic reactions. If you experience allergic reactions like skin rash, itching or hives, swelling of the face, lips, or tongue, tell your doctor or health care professional right away. In some cases, you may be given additional medicines to help with side effects. Follow all directions for their use. This drug may make you feel generally unwell. This is not uncommon, as chemotherapy can affect healthy cells as well as cancer cells. Report any side effects. Continue your course of treatment even though you feel ill unless your doctor tells you to stop. Call your doctor or health care professional for advice if you get a fever, chills or sore throat, or other symptoms of a cold or flu. Do not treat yourself. This drug decreases your body's ability to fight infections. Try to avoid being around people who are sick. This medicine may increase your risk to bruise or bleed. Call your doctor or health care professional if you notice any unusual bleeding. Be careful brushing and flossing your teeth or using a toothpick because you may get an infection or bleed more easily. If you have any dental work done, tell your dentist you are receiving this medicine. Avoid taking products that contain aspirin, acetaminophen, ibuprofen, naproxen, or ketoprofen unless instructed by your doctor. These medicines may hide a fever. Do not become pregnant while taking this medicine or for 6 months after stopping it. Women should inform  their doctor if they wish to become pregnant or think they might be pregnant. Men should not father a child while taking this medicine or for 3 months after stopping it. There is a potential for serious side effects to an unborn child. Talk to your health care professional or pharmacist for more information. Do not breast-feed an infant while taking this medicine or for 2 weeks after stopping it. This medicine may interfere with the ability to get pregnant or to father a child. You should talk to  your doctor or health care professional if you are concerned about your fertility. What side effects may I notice from receiving this medication? Side effects that you should report to your doctor or health care professional as soon as possible: allergic reactions like skin rash, itching or hives, swelling of the face, lips, or tongue breathing problems changes in vision fast, irregular heartbeat low blood pressure mouth sores pain, tingling, numbness in the hands or feet signs of decreased platelets or bleeding - bruising, pinpoint red spots on the skin, black, tarry stools, blood in the urine signs of decreased red blood cells - unusually weak or tired, feeling faint or lightheaded, falls signs of infection - fever or chills, cough, sore throat, pain or difficulty passing urine signs and symptoms of liver injury like dark yellow or brown urine; general ill feeling or flu-like symptoms; light-colored stools; loss of appetite; nausea; right upper belly pain; unusually weak or tired; yellowing of the eyes or skin swelling of the ankles, feet, hands unusually slow heartbeat Side effects that usually do not require medical attention (report to your doctor or health care professional if they continue or are bothersome): diarrhea hair loss loss of appetite nausea, vomiting tiredness This list may not describe all possible side effects. Call your doctor for medical advice about side effects. You may report side effects to FDA at 1-800-FDA-1088. Where should I keep my medication? This drug is given in a hospital or clinic and will not be stored at home. NOTE: This sheet is a summary. It may not cover all possible information. If you have questions about this medicine, talk to your doctor, pharmacist, or health care provider.  2023 Elsevier/Gold Standard (2016-10-25 00:00:00) Gemcitabine injection What is this medication? GEMCITABINE (jem SYE ta been) is a chemotherapy drug. This medicine is  used to treat many types of cancer like breast cancer, lung cancer, pancreatic cancer, and ovarian cancer. This medicine may be used for other purposes; ask your health care provider or pharmacist if you have questions. COMMON BRAND NAME(S): Gemzar, Infugem What should I tell my care team before I take this medication? They need to know if you have any of these conditions: blood disorders infection kidney disease liver disease lung or breathing disease, like asthma recent or ongoing radiation therapy an unusual or allergic reaction to gemcitabine, other chemotherapy, other medicines, foods, dyes, or preservatives pregnant or trying to get pregnant breast-feeding How should I use this medication? This drug is given as an infusion into a vein. It is administered in a hospital or clinic by a specially trained health care professional. Talk to your pediatrician regarding the use of this medicine in children. Special care may be needed. Overdosage: If you think you have taken too much of this medicine contact a poison control center or emergency room at once. NOTE: This medicine is only for you. Do not share this medicine with others. What if I miss a dose? It is important not to miss  your dose. Call your doctor or health care professional if you are unable to keep an appointment. What may interact with this medication? medicines to increase blood counts like filgrastim, pegfilgrastim, sargramostim some other chemotherapy drugs like cisplatin vaccines Talk to your doctor or health care professional before taking any of these medicines: acetaminophen aspirin ibuprofen ketoprofen naproxen This list may not describe all possible interactions. Give your health care provider a list of all the medicines, herbs, non-prescription drugs, or dietary supplements you use. Also tell them if you smoke, drink alcohol, or use illegal drugs. Some items may interact with your medicine. What should I watch  for while using this medication? Visit your doctor for checks on your progress. This drug may make you feel generally unwell. This is not uncommon, as chemotherapy can affect healthy cells as well as cancer cells. Report any side effects. Continue your course of treatment even though you feel ill unless your doctor tells you to stop. In some cases, you may be given additional medicines to help with side effects. Follow all directions for their use. Call your doctor or health care professional for advice if you get a fever, chills or sore throat, or other symptoms of a cold or flu. Do not treat yourself. This drug decreases your body's ability to fight infections. Try to avoid being around people who are sick. This medicine may increase your risk to bruise or bleed. Call your doctor or health care professional if you notice any unusual bleeding. Be careful brushing and flossing your teeth or using a toothpick because you may get an infection or bleed more easily. If you have any dental work done, tell your dentist you are receiving this medicine. Avoid taking products that contain aspirin, acetaminophen, ibuprofen, naproxen, or ketoprofen unless instructed by your doctor. These medicines may hide a fever. Do not become pregnant while taking this medicine or for 6 months after stopping it. Women should inform their doctor if they wish to become pregnant or think they might be pregnant. Men should not father a child while taking this medicine and for 3 months after stopping it. There is a potential for serious side effects to an unborn child. Talk to your health care professional or pharmacist for more information. Do not breast-feed an infant while taking this medicine or for at least 1 week after stopping it. Men should inform their doctors if they wish to father a child. This medicine may lower sperm counts. Talk with your doctor or health care professional if you are concerned about your fertility. What  side effects may I notice from receiving this medication? Side effects that you should report to your doctor or health care professional as soon as possible: allergic reactions like skin rash, itching or hives, swelling of the face, lips, or tongue breathing problems pain, redness, or irritation at site where injected signs and symptoms of a dangerous change in heartbeat or heart rhythm like chest pain; dizziness; fast or irregular heartbeat; palpitations; feeling faint or lightheaded, falls; breathing problems signs of decreased platelets or bleeding - bruising, pinpoint red spots on the skin, black, tarry stools, blood in the urine signs of decreased red blood cells - unusually weak or tired, feeling faint or lightheaded, falls signs of infection - fever or chills, cough, sore throat, pain or difficulty passing urine signs and symptoms of kidney injury like trouble passing urine or change in the amount of urine signs and symptoms of liver injury like dark yellow or brown urine;  general ill feeling or flu-like symptoms; light-colored stools; loss of appetite; nausea; right upper belly pain; unusually weak or tired; yellowing of the eyes or skin swelling of ankles, feet, hands Side effects that usually do not require medical attention (report to your doctor or health care professional if they continue or are bothersome): constipation diarrhea hair loss loss of appetite nausea rash vomiting This list may not describe all possible side effects. Call your doctor for medical advice about side effects. You may report side effects to FDA at 1-800-FDA-1088. Where should I keep my medication? This drug is given in a hospital or clinic and will not be stored at home. NOTE: This sheet is a summary. It may not cover all possible information. If you have questions about this medicine, talk to your doctor, pharmacist, or health care provider.  2023 Elsevier/Gold Standard (2017-05-17 00:00:00)

## 2021-10-01 ENCOUNTER — Other Ambulatory Visit: Payer: Self-pay

## 2021-10-02 ENCOUNTER — Other Ambulatory Visit: Payer: Self-pay

## 2021-10-05 ENCOUNTER — Other Ambulatory Visit: Payer: Self-pay

## 2021-10-06 ENCOUNTER — Other Ambulatory Visit: Payer: Self-pay

## 2021-10-07 ENCOUNTER — Inpatient Hospital Stay: Payer: Medicare Other | Attending: Physician Assistant | Admitting: Dietician

## 2021-10-07 ENCOUNTER — Other Ambulatory Visit: Payer: Self-pay

## 2021-10-07 DIAGNOSIS — C250 Malignant neoplasm of head of pancreas: Secondary | ICD-10-CM | POA: Insufficient documentation

## 2021-10-07 DIAGNOSIS — C779 Secondary and unspecified malignant neoplasm of lymph node, unspecified: Secondary | ICD-10-CM | POA: Insufficient documentation

## 2021-10-07 DIAGNOSIS — D701 Agranulocytosis secondary to cancer chemotherapy: Secondary | ICD-10-CM | POA: Insufficient documentation

## 2021-10-07 DIAGNOSIS — R197 Diarrhea, unspecified: Secondary | ICD-10-CM | POA: Insufficient documentation

## 2021-10-07 DIAGNOSIS — R110 Nausea: Secondary | ICD-10-CM | POA: Insufficient documentation

## 2021-10-07 DIAGNOSIS — Z5111 Encounter for antineoplastic chemotherapy: Secondary | ICD-10-CM | POA: Insufficient documentation

## 2021-10-07 NOTE — Progress Notes (Signed)
Nutrition Follow Up: Struggling with increasing her protein.  She doesn't care for the Sprouts protein drinks, also milk didn't sit well with her nausea.  Still taking Creon, still trying to get some assistance for the cost. She stated taking her anti-nausea meds. She did get the Banatrol samples but hasn't tried them yet. Bowels are still loose and she hasn't started  Imodium. She fears constipation with a previous history of taking it.  She doesn't want to take any more pills.  She was all for trying the Banatol until she learned the cost. Appetite hasn't changed, still only tolerating small volume of food until she has pain.     Medications: currently not taking any anti diarrheal meds, patient had trouble with KCL, MD changed to Micro K, encouraged Imodium for diarrhea  Labs: 09/30/21  K+ 4.1, Mg 1.5, Hgb 9.2  Anthropometrics:   Weight up 1# past 2 weeks   Height: 60" Weight: 09/30/21  116#  09/16/21  115# 09/02/21  118.8# 08/19/21  125.6#   UBW: 145-150 (she has no interest in returning to this weight) BMI: 22.46     Estimated Energy Needs   Kcals: 1600 Protein: 54-65 Fluid:  2 L plus losses   NUTRITION DIAGNOSIS: Unintended wt loss related to cancer and associated treatments as evidenced by 30 pound wt loss per patient recall. Improved, weight increased by 1 pound past 2 weeks.    INTERVENTION: Encouraged increasing calories with full fat Greek yogurts.  Encouraged 2-3 bananas a day to help solidify stools and increase her K+ as added bonus.    MONITORING, EVALUATION, GOAL: Nutrition impact symptoms, weight trends, PO intake  Patient's goal is weight maintenance.    NEXT VISIT: Telephone follow up 2 weeks   April Manson, RDN, LDN Registered Dietitian, [facility] Part Time Remote (Usual office hours: Tuesday-Thursday) Mobile: [phone]

## 2021-10-10 ENCOUNTER — Other Ambulatory Visit: Payer: Self-pay | Admitting: Oncology

## 2021-10-14 ENCOUNTER — Encounter: Payer: Self-pay | Admitting: *Deleted

## 2021-10-14 ENCOUNTER — Inpatient Hospital Stay: Payer: Medicare Other

## 2021-10-14 ENCOUNTER — Encounter: Payer: Self-pay | Admitting: Nurse Practitioner

## 2021-10-14 ENCOUNTER — Inpatient Hospital Stay (HOSPITAL_BASED_OUTPATIENT_CLINIC_OR_DEPARTMENT_OTHER): Payer: Medicare Other | Admitting: Nurse Practitioner

## 2021-10-14 VITALS — BP 138/79 | HR 81 | Temp 98.1°F | Resp 20 | Ht 60.0 in | Wt 114.0 lb

## 2021-10-14 DIAGNOSIS — C250 Malignant neoplasm of head of pancreas: Secondary | ICD-10-CM

## 2021-10-14 DIAGNOSIS — C779 Secondary and unspecified malignant neoplasm of lymph node, unspecified: Secondary | ICD-10-CM | POA: Diagnosis not present

## 2021-10-14 DIAGNOSIS — D701 Agranulocytosis secondary to cancer chemotherapy: Secondary | ICD-10-CM | POA: Diagnosis not present

## 2021-10-14 DIAGNOSIS — R197 Diarrhea, unspecified: Secondary | ICD-10-CM | POA: Diagnosis not present

## 2021-10-14 DIAGNOSIS — Z5111 Encounter for antineoplastic chemotherapy: Secondary | ICD-10-CM | POA: Diagnosis present

## 2021-10-14 DIAGNOSIS — R110 Nausea: Secondary | ICD-10-CM | POA: Diagnosis not present

## 2021-10-14 LAB — CBC WITH DIFFERENTIAL (CANCER CENTER ONLY)
Abs Immature Granulocytes: 0.03 10*3/uL (ref 0.00–0.07)
Basophils Absolute: 0.1 10*3/uL (ref 0.0–0.1)
Basophils Relative: 1 %
Eosinophils Absolute: 0.8 10*3/uL — ABNORMAL HIGH (ref 0.0–0.5)
Eosinophils Relative: 12 %
HCT: 27.4 % — ABNORMAL LOW (ref 36.0–46.0)
Hemoglobin: 9.4 g/dL — ABNORMAL LOW (ref 12.0–15.0)
Immature Granulocytes: 1 %
Lymphocytes Relative: 14 %
Lymphs Abs: 0.9 10*3/uL (ref 0.7–4.0)
MCH: 30 pg (ref 26.0–34.0)
MCHC: 34.3 g/dL (ref 30.0–36.0)
MCV: 87.5 fL (ref 80.0–100.0)
Monocytes Absolute: 1.1 10*3/uL — ABNORMAL HIGH (ref 0.1–1.0)
Monocytes Relative: 17 %
Neutro Abs: 3.7 10*3/uL (ref 1.7–7.7)
Neutrophils Relative %: 55 %
Platelet Count: 432 10*3/uL — ABNORMAL HIGH (ref 150–400)
RBC: 3.13 MIL/uL — ABNORMAL LOW (ref 3.87–5.11)
RDW: 20 % — ABNORMAL HIGH (ref 11.5–15.5)
WBC Count: 6.6 10*3/uL (ref 4.0–10.5)
nRBC: 0 % (ref 0.0–0.2)

## 2021-10-14 LAB — CMP (CANCER CENTER ONLY)
ALT: 25 U/L (ref 0–44)
AST: 27 U/L (ref 15–41)
Albumin: 2.5 g/dL — ABNORMAL LOW (ref 3.5–5.0)
Alkaline Phosphatase: 80 U/L (ref 38–126)
Anion gap: 8 (ref 5–15)
BUN: 14 mg/dL (ref 8–23)
CO2: 28 mmol/L (ref 22–32)
Calcium: 8.1 mg/dL — ABNORMAL LOW (ref 8.9–10.3)
Chloride: 97 mmol/L — ABNORMAL LOW (ref 98–111)
Creatinine: 0.9 mg/dL (ref 0.44–1.00)
GFR, Estimated: >60 mL/min (ref >60)
Glucose, Bld: 137 mg/dL — ABNORMAL HIGH (ref 70–99)
Potassium: 4 mmol/L (ref 3.5–5.1)
Sodium: 133 mmol/L — ABNORMAL LOW (ref 135–145)
Total Bilirubin: 0.5 mg/dL (ref 0.3–1.2)
Total Protein: 5.7 g/dL — ABNORMAL LOW (ref 6.5–8.1)

## 2021-10-14 MED ORDER — PACLITAXEL PROTEIN-BOUND CHEMO INJECTION 100 MG
100.0000 mg/m2 | Freq: Once | INTRAVENOUS | Status: AC
  Administered 2021-10-14: 150 mg via INTRAVENOUS
  Filled 2021-10-14: qty 30

## 2021-10-14 MED ORDER — SODIUM CHLORIDE 0.9 % IV SOLN
1000.0000 mg/m2 | Freq: Once | INTRAVENOUS | Status: AC
  Administered 2021-10-14: 1482 mg via INTRAVENOUS
  Filled 2021-10-14: qty 26.3

## 2021-10-14 MED ORDER — SODIUM CHLORIDE 0.9% FLUSH
10.0000 mL | INTRAVENOUS | Status: DC | PRN
  Administered 2021-10-14: 10 mL

## 2021-10-14 MED ORDER — HEPARIN SOD (PORK) LOCK FLUSH 100 UNIT/ML IV SOLN
500.0000 [IU] | Freq: Once | INTRAVENOUS | Status: AC | PRN
  Administered 2021-10-14: 500 [IU]

## 2021-10-14 MED ORDER — SODIUM CHLORIDE 0.9 % IV SOLN: Freq: Once | INTRAVENOUS | Status: AC

## 2021-10-14 MED ORDER — PROCHLORPERAZINE MALEATE 10 MG PO TABS
10.0000 mg | ORAL_TABLET | Freq: Once | ORAL | Status: AC
  Administered 2021-10-14: 10 mg via ORAL

## 2021-10-14 NOTE — Patient Instructions (Signed)
[facility]   Discharge Instructions: Thank you for choosing [facility] to provide your oncology and hematology care.   If you have a lab appointment with the [facility], please go directly to the [facility] and check in at the registration area.   Wear comfortable clothing and clothing appropriate for easy access to any Portacath or PICC line.   We strive to give you quality time with your provider. You may need to reschedule your appointment if you arrive late (15 or more minutes).  Arriving late affects you and other patients whose appointments are after yours.  Also, if you miss three or more appointments without notifying the office, you may be dismissed from the clinic at the provider's discretion.      For prescription refill requests, have your pharmacy contact our office and allow 72 hours for refills to be completed.    Today you received the following chemotherapy and/or immunotherapy agents Paclitaxel-protein bound (ABRAXANE) & Gemcitabine (GEMZAR).      To help prevent nausea and vomiting after your treatment, we encourage you to take your nausea medication as directed.  BELOW ARE SYMPTOMS THAT SHOULD BE REPORTED IMMEDIATELY: *FEVER GREATER THAN 100.4 F (38 C) OR HIGHER *CHILLS OR SWEATING *NAUSEA AND VOMITING THAT IS NOT CONTROLLED WITH YOUR NAUSEA MEDICATION *UNUSUAL SHORTNESS OF BREATH *UNUSUAL BRUISING OR BLEEDING *URINARY PROBLEMS (pain or burning when urinating, or frequent urination) *BOWEL PROBLEMS (unusual diarrhea, constipation, pain near the anus) TENDERNESS IN MOUTH AND THROAT WITH OR WITHOUT PRESENCE OF ULCERS (sore throat, sores in mouth, or a toothache) UNUSUAL RASH, SWELLING OR PAIN  UNUSUAL VAGINAL DISCHARGE OR ITCHING   Items with * indicate a potential emergency and should be followed up as soon as possible or go to the Emergency Department if any problems should occur.  Please show the CHEMOTHERAPY ALERT  CARD or IMMUNOTHERAPY ALERT CARD at check-in to the Emergency Department and triage nurse.  Should you have questions after your visit or need to cancel or reschedule your appointment, please contact [facility]  Dept: [phone]  and follow the prompts.  Office hours are 8:00 a.m. to 4:30 p.m. Monday - Friday. Please note that voicemails left after 4:00 p.m. may not be returned until the following business day.  We are closed weekends and major holidays. You have access to a nurse at all times for urgent questions. Please call the main number to the clinic Dept: [phone] and follow the prompts.   For any non-urgent questions, you may also contact your provider using MyChart. We now offer e-Visits for anyone 60 and older to request care online for non-urgent symptoms. For details visit [email or web address].   Also download the MyChart app! Go to the app store, search "MyChart", open the app, select [facility], and log in with your MyChart username and password.  Masks are optional in the cancer centers. If you would like for your care team to wear a mask while they are taking care of you, please let them know. You may have one support person who is at least 76 years old accompany you for your appointments.  Paclitaxel Nanoparticle Albumin-Bound Injection What is this medication? NANOPARTICLE ALBUMIN-BOUND PACLITAXEL (Na no PAHR ti kuhl al BYOO muhn-bound PAK li TAX el) treats some types of cancer. It works by slowing down the growth of cancer cells. This medicine may be used for other purposes; ask your health care provider or pharmacist if you  have questions. COMMON BRAND NAME(S): Abraxane What should I tell my care team before I take this medication? They need to know if you have any of these conditions: Liver disease Low white blood cell levels An unusual or allergic reaction to paclitaxel, albumin, other medications, foods, dyes, or preservatives If you  or your partner are pregnant or trying to get pregnant Breast-feeding How should I use this medication? This medication is injected into a vein. It is given by your care team in a hospital or clinic setting. Talk to your care team about the use of this medication in children. Special care may be needed. Overdosage: If you think you have taken too much of this medicine contact a poison control center or emergency room at once. NOTE: This medicine is only for you. Do not share this medicine with others. What if I miss a dose? Keep appointments for follow-up doses. It is important not to miss your dose. Call your care team if you are unable to keep an appointment. What may interact with this medication? Other medications may affect the way this medication works. Talk with your care team about all of the medications you take. They may suggest changes to your treatment plan to lower the risk of side effects and to make sure your medications work as intended. This list may not describe all possible interactions. Give your health care provider a list of all the medicines, herbs, non-prescription drugs, or dietary supplements you use. Also tell them if you smoke, drink alcohol, or use illegal drugs. Some items may interact with your medicine. What should I watch for while using this medication? Your condition will be monitored carefully while you are receiving this medication. You may need blood work while taking this medication. This medication may make you feel generally unwell. This is not uncommon as chemotherapy can affect healthy cells as well as cancer cells. Report any side effects. Continue your course of treatment even though you feel ill unless your care team tells you to stop. This medication can cause serious allergic reactions. To reduce the risk, your care team may give you other medications to take before receiving this one. Be sure to follow the directions from your care team. This  medication may increase your risk of getting an infection. Call your care team for advice if you get a fever, chills, sore throat, or other symptoms of a cold or flu. Do not treat yourself. Try to avoid being around people who are sick. This medication may increase your risk to bruise or bleed. Call your care team if you notice any unusual bleeding. Be careful brushing or flossing your teeth or using a toothpick because you may get an infection or bleed more easily. If you have any dental work done, tell your dentist you are receiving this medication. Talk to your care team if you or your partner may be pregnant. Serious birth defects can occur if you take this medication during pregnancy and for 6 months after the last dose. You will need a negative pregnancy test before starting this medication. Contraception is recommended while taking this medication and for 6 months after the last dose. Your care team can help you find the option that works for you. If your partner can get pregnant, use a condom during sex while taking this medication and for 3 months after the last dose. Do not breastfeed while taking this medication and for 2 weeks after the last dose. This medication may cause infertility. Talk  to your care team if you are concerned about your fertility. What side effects may I notice from receiving this medication? Side effects that you should report to your care team as soon as possible: Allergic reactions--skin rash, itching, hives, swelling of the face, lips, tongue, or throat Dry cough, shortness of breath or trouble breathing Infection--fever, chills, cough, sore throat, wounds that don't heal, pain or trouble when passing urine, general feeling of discomfort or being unwell Low red blood cell level--unusual weakness or fatigue, dizziness, headache, trouble breathing Pain, tingling, or numbness in the hands or feet Stomach pain, unusual weakness or fatigue, nausea, vomiting, diarrhea, or  fever that lasts longer than expected Unusual bruising or bleeding Side effects that usually do not require medical attention (report to your care team if they continue or are bothersome): Diarrhea Fatigue Hair loss Loss of appetite Nausea Vomiting This list may not describe all possible side effects. Call your doctor for medical advice about side effects. You may report side effects to FDA at 1-800-FDA-1088. Where should I keep my medication? This medication is given in a hospital or clinic. It will not be stored at home. NOTE: This sheet is a summary. It may not cover all possible information. If you have questions about this medicine, talk to your doctor, pharmacist, or health care provider.  2023 Elsevier/Gold Standard (2021-06-23 00:00:00)  Gemcitabine Injection What is this medication? GEMCITABINE (jem SYE ta been) treats some types of cancer. It works by slowing down the growth of cancer cells. This medicine may be used for other purposes; ask your health care provider or pharmacist if you have questions. COMMON BRAND NAME(S): Gemzar, Infugem What should I tell my care team before I take this medication? They need to know if you have any of these conditions: Blood disorders Infection Kidney disease Liver disease Lung or breathing disease, such as asthma or COPD Recent or ongoing radiation therapy An unusual or allergic reaction to gemcitabine, other medications, foods, dyes, or preservatives If you or your partner are pregnant or trying to get pregnant Breast-feeding How should I use this medication? This medication is injected into a vein. It is given by your care team in a hospital or clinic setting. Talk to your care team about the use of this medication in children. Special care may be needed. Overdosage: If you think you have taken too much of this medicine contact a poison control center or emergency room at once. NOTE: This medicine is only for you. Do not share this  medicine with others. What if I miss a dose? Keep appointments for follow-up doses. It is important not to miss your dose. Call your care team if you are unable to keep an appointment. What may interact with this medication? Interactions have not been studied. This list may not describe all possible interactions. Give your health care provider a list of all the medicines, herbs, non-prescription drugs, or dietary supplements you use. Also tell them if you smoke, drink alcohol, or use illegal drugs. Some items may interact with your medicine. What should I watch for while using this medication? Your condition will be monitored carefully while you are receiving this medication. This medication may make you feel generally unwell. This is not uncommon, as chemotherapy can affect healthy cells as well as cancer cells. Report any side effects. Continue your course of treatment even though you feel ill unless your care team tells you to stop. In some cases, you may be given additional medications to help  with side effects. Follow all directions for their use. This medication may increase your risk of getting an infection. Call your care team for advice if you get a fever, chills, sore throat, or other symptoms of a cold or flu. Do not treat yourself. Try to avoid being around people who are sick. This medication may increase your risk to bruise or bleed. Call your care team if you notice any unusual bleeding. Be careful brushing or flossing your teeth or using a toothpick because you may get an infection or bleed more easily. If you have any dental work done, tell your dentist you are receiving this medication. Avoid taking medications that contain aspirin, acetaminophen, ibuprofen, naproxen, or ketoprofen unless instructed by your care team. These medications may hide a fever. Talk to your care team if you or your partner wish to become pregnant or think you might be pregnant. This medication can cause  serious birth defects if taken during pregnancy and for 6 months after the last dose. A negative pregnancy test is required before starting this medication. A reliable form of contraception is recommended while taking this medication and for 6 months after the last dose. Talk to your care team about effective forms of contraception. Do not father a child while taking this medication and for 3 months after the last dose. Use a condom while having sex during this time period. Do not breastfeed while taking this medication and for at least 1 week after the last dose. This medication may cause infertility. Talk to your care team if you are concerned about your fertility. What side effects may I notice from receiving this medication? Side effects that you should report to your care team as soon as possible: Allergic reactions--skin rash, itching, hives, swelling of the face, lips, tongue, or throat Capillary leak syndrome--stomach or muscle pain, unusual weakness or fatigue, feeling faint or lightheaded, decrease in the amount of urine, swelling of the ankles, hands, or feet, trouble breathing Infection--fever, chills, cough, sore throat, wounds that don't heal, pain or trouble when passing urine, general feeling of discomfort or being unwell Liver injury--right upper belly pain, loss of appetite, nausea, light-colored stool, dark yellow or brown urine, yellowing skin or eyes, unusual weakness or fatigue Low red blood cell level--unusual weakness or fatigue, dizziness, headache, trouble breathing Lung injury--shortness of breath or trouble breathing, cough, spitting up blood, chest pain, fever Stomach pain, bloody diarrhea, pale skin, unusual weakness or fatigue, decrease in the amount of urine, which may be signs of hemolytic uremic syndrome Sudden and severe headache, confusion, change in vision, seizures, which may be signs of posterior reversible encephalopathy syndrome (PRES) Unusual bruising or  bleeding Side effects that usually do not require medical attention (report to your care team if they continue or are bothersome): Diarrhea Drowsiness Hair loss Nausea Pain, redness, or swelling with sores inside the mouth or throat Vomiting This list may not describe all possible side effects. Call your doctor for medical advice about side effects. You may report side effects to FDA at 1-800-FDA-1088. Where should I keep my medication? This medication is given in a hospital or clinic. It will not be stored at home. NOTE: This sheet is a summary. It may not cover all possible information. If you have questions about this medicine, talk to your doctor, pharmacist, or health care provider.  2023 Elsevier/Gold Standard (2021-06-23 00:00:00)

## 2021-10-14 NOTE — Progress Notes (Signed)
Patient seen by Lisa Thomas NP today  Vitals are within treatment parameters.  Labs reviewed by Lisa Thomas NP and are within treatment parameters.  Per physician team, patient is ready for treatment and there are NO modifications to the treatment plan.     

## 2021-10-14 NOTE — Progress Notes (Signed)
  Tonya Jenkins OFFICE PROGRESS NOTE   Diagnosis: Pancreas cancer  INTERVAL HISTORY:   Tonya Jenkins returns as scheduled.  She completed cycle 4 gemcitabine/Abraxane 09/30/2021.  No fever or rash after treatment.  She remains fatigued.  She continues to have diarrhea and "low-grade" nausea.  Both of these predated the start of chemotherapy.  She is taking Creon.  She has not tried Imodium.  She has not tried Zofran or Compazine for the nausea.  Objective:  Vital signs in last 24 hours:  Blood pressure 138/79, pulse 81, temperature 98.1 F (36.7 C), temperature source Oral, resp. rate 20, height 5' (1.524 m), weight 114 lb (51.7 kg), SpO2 99 %.    HEENT: No thrush or ulcers. Resp: Lungs clear bilaterally. Cardio: Regular rate and rhythm. GI: Abdomen soft and nontender.  No hepatosplenomegaly.  Healed midline incision. Vascular: No leg edema. Skin: No rash. Port-A-Cath without erythema   Lab Results:  Lab Results  Component Value Date   WBC 6.6 10/14/2021   HGB 9.4 (L) 10/14/2021   HCT 27.4 (L) 10/14/2021   MCV 87.5 10/14/2021   PLT 432 (H) 10/14/2021   NEUTROABS 3.7 10/14/2021    Imaging:  No results found.  Medications: I have reviewed the patient's current medications.  Assessment/Plan: Pancreas cancer, stage III (pT2pN2) CT abdomen/pelvis 05/12/2021-dilated intrahepatic and [place] bile ducts, abrupt narrowing of the common bile duct and pancreatic duct at the level of pancreas head, distended gallbladder MRI/MRCP 05/13/2018 23-3.6 x 2.1 x 4.2 cm enhancing mass in the head of the pancreas with obstruction of the pancreatic duct and common bile duct, no lymphadenopathy, no liver lesions Internal/external biliary drain 05/14/2021 CT chest 05/14/2021-small right pleural effusion, no evidence of metastatic disease Thoracentesis 05/16/2021-negative cytology Pancreat[facility]uodenectomy at [facility] 06/22/2021-no evidence of metastatic disease, inflamed gallbladder with  percutaneous biliary drain, large pancreas head mass-gallbladder with acute cholecystitis and abscess formation negative for malignancy.  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, negative resection margins, 6/36 lymph nodes with metastatic adenocarcinoma, lymphovascular and perineural invasion CT angio chest and CT abdomen/pelvis 08/04/2021-negative for pulmonary embolism, status post Whipple procedure, 11 mm soft tissue nodule along the jejunal mesentery-nonspecific Cycle 1 gemcitabine/Abraxane 08/19/2021 Cycle 2 gemcitabine/Abraxane 09/02/2021 Cycle 3 gemcitabine/Abraxane 09/16/2021 Cycle 4 gemcitabine/Abraxane 09/30/2021 Cycle 5 gemcitabine/Abraxane 10/14/2021 Postoperative wound dehiscence-status post fascial closure and placement of a wound VAC 07/02/2021 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  She has completed 4 cycles of gemcitabine/Abraxane.  She continues to tolerate treatment well.  Plan to proceed with cycle 5 today as scheduled.  CBC and chemistry panel reviewed.  Labs adequate to proceed as above.  The diarrhea and nausea predated the start of chemotherapy.  She will continue Creon.  I recommended she try Imodium as well.  She will try Compazine and Zofran as needed for nausea.  She will return for an office visit and chemotherapy in 2 weeks.  Ned Card ANP/GNP-BC   10/14/2021  10:18 AM

## 2021-10-15 ENCOUNTER — Other Ambulatory Visit: Payer: Self-pay

## 2021-10-16 LAB — CANCER ANTIGEN 19-9: CA 19-9: <2 U/mL (ref 0–35)

## 2021-10-19 ENCOUNTER — Other Ambulatory Visit: Payer: Self-pay | Admitting: Nurse Practitioner

## 2021-10-19 DIAGNOSIS — C250 Malignant neoplasm of head of pancreas: Secondary | ICD-10-CM

## 2021-10-20 ENCOUNTER — Telehealth: Payer: Self-pay

## 2021-10-20 NOTE — Telephone Encounter (Signed)
Tonya Jenkins called and had a questions about her Protonix. Can she stop the Protonix because of the side-effect.She was reading the side-effect, and one was diarrhea. Patient states she's having diarrhea for months and the providers are aware of the diarrhea. Patient will not take antidiarrhea to help with the diarrhea. Per Lattie Haw she needs to reach out to the provider whom prescribe the Protonix but its ok to stop. Patient expresses understanding and had no further questions or concerns at this time.

## 2021-10-21 ENCOUNTER — Ambulatory Visit: Payer: Medicare Other | Admitting: Dietician

## 2021-10-21 NOTE — Progress Notes (Signed)
Nutrition Follow Up:  Called patient at her home phone for scheduled telephone follow up, she didn't answer, [facility] on voice mail.  Patient called back stating she missed a call.  She stated she feels lousy and wanted to reschedule follow up.  I told her would be happy to call on Tuesday afternoon.  Anthropometrics:   Weight loss 2# past 2 weeks   Height: 60" Weight: 10/14/21  114# 09/30/21  116#  09/16/21  115# 09/02/21  118.8# 08/19/21  125.6#   Per NP notes still not using antidiarrhea medicine and also noted she wanted to d/c using Protonix due to side effect of potential diarrhea.   April Manson, RDN, LDN Registered Dietitian, [facility] Part Time Remote (Usual office hours: Tuesday-Thursday) Mobile: [phone] Remote Office: [phone]

## 2021-10-24 ENCOUNTER — Other Ambulatory Visit: Payer: Self-pay | Admitting: Oncology

## 2021-10-24 NOTE — Progress Notes (Signed)
OFF PATHWAY REGIMEN - Pancreatic Adenocarcinoma  No Change  Continue With Treatment as Ordered.  Original Decision Date/Time: 08/09/2021 15:05   [id number]:Gemcitabine 1,000 mg/m2 IV D1,8,15 + Nab-Paclitaxel 125 mg/m2 IV D1,8,15 q28 Days:   A cycle is every 28 days:     Nab-paclitaxel (protein bound)      Gemcitabine   **Always confirm dose/schedule in your pharmacy ordering system**  Patient Characteristics: Postoperative without Neoadjuvant Therapy, M0 (Pathologic Staging), Negative Margins (Includes Positive Lymph Nodes) Therapeutic Status: Postoperative without Neoadjuvant Therapy, M0 (Pathologic Staging) AJCC T Category: pT2 AJCC N Category: pN2 AJCC M Category: cM0 AJCC 8 Stage Grouping: III Intent of Therapy: Curative Intent, Discussed with Patient

## 2021-10-26 ENCOUNTER — Ambulatory Visit: Payer: Medicare Other | Admitting: Dietician

## 2021-10-26 NOTE — Progress Notes (Signed)
  Nutrition Follow Up:  Called patient at her home telephone. She reports she is feeling worse this week than last and with increased nausea and her diarrhea hasn't resolved or improved. She's still not taking her anti-nausea or anti diarrheal meds. She doesn't like taking pills, would prefer anything she could try with a capsule.  She hasn't increased her banana intake as discussed.  Appetite hasn't changed, still only tolerating small volume of food.  Has started to try more protein adding some more cottage cheese to her intake.  Hasn't tried the Pedialyte samples provided.     Medications: little change with compliance of intake of prescribed meds   Labs: 10/14/21  K+ 4.0 improved   Anthropometrics:   Weight loss 2# past 2 weeks   Height: 60" Weight: 10/14/21  114# 09/30/21  116#  09/16/21  115# 09/02/21  118.8# 08/19/21  125.6#  UBW: 145-150 (she has no interest in returning to this weight) BMI: 22.46     Estimated Energy Needs   Kcals: 1600 Protein: 54-65 Fluid:  2 L plus losses   NUTRITION DIAGNOSIS: Unintended wt loss related to cancer and associated treatments as evidenced by continued wt. loss per patient recall. Weight loss continues to be gradual.    INTERVENTION: Encouraged increasing calories with full fat Greek yogurts.  Encouraged 2-3 bananas a day to help solidify stools and increase her K+ as added bonus so she wouldn't need K+ pills.  She had no more questions or desired resources.    MONITORING, EVALUATION, GOAL: Nutrition impact symptoms, weight trends, PO intake   Patient's goal is weight maintenance.    NEXT VISIT: PRN at patient or provider request   April Manson, RDN, LDN Registered Dietitian, [place] Part Time Remote (Usual office hours: Tuesday-Thursday) Mobile: [phone]

## 2021-10-28 ENCOUNTER — Encounter: Payer: Self-pay | Admitting: *Deleted

## 2021-10-28 ENCOUNTER — Other Ambulatory Visit: Payer: Self-pay | Admitting: *Deleted

## 2021-10-28 ENCOUNTER — Inpatient Hospital Stay: Payer: Medicare Other

## 2021-10-28 ENCOUNTER — Inpatient Hospital Stay (HOSPITAL_BASED_OUTPATIENT_CLINIC_OR_DEPARTMENT_OTHER): Payer: Medicare Other | Admitting: Nurse Practitioner

## 2021-10-28 ENCOUNTER — Encounter: Payer: Self-pay | Admitting: Nurse Practitioner

## 2021-10-28 VITALS — BP 140/75 | HR 86 | Temp 98.1°F | Resp 20 | Ht 60.0 in | Wt 109.8 lb

## 2021-10-28 VITALS — BP 135/79 | HR 79 | Temp 98.2°F | Resp 18

## 2021-10-28 DIAGNOSIS — C250 Malignant neoplasm of head of pancreas: Secondary | ICD-10-CM | POA: Diagnosis not present

## 2021-10-28 DIAGNOSIS — E860 Dehydration: Secondary | ICD-10-CM

## 2021-10-28 DIAGNOSIS — Z5111 Encounter for antineoplastic chemotherapy: Secondary | ICD-10-CM | POA: Diagnosis not present

## 2021-10-28 DIAGNOSIS — Z95828 Presence of other vascular implants and grafts: Secondary | ICD-10-CM

## 2021-10-28 LAB — CBC WITH DIFFERENTIAL (CANCER CENTER ONLY)
Abs Immature Granulocytes: 0.03 10*3/uL (ref 0.00–0.07)
Basophils Absolute: 0.1 10*3/uL (ref 0.0–0.1)
Basophils Relative: 2 %
Eosinophils Absolute: 0.5 10*3/uL (ref 0.0–0.5)
Eosinophils Relative: 15 %
HCT: 27.4 % — ABNORMAL LOW (ref 36.0–46.0)
Hemoglobin: 9.5 g/dL — ABNORMAL LOW (ref 12.0–15.0)
Immature Granulocytes: 1 %
Lymphocytes Relative: 25 %
Lymphs Abs: 0.9 10*3/uL (ref 0.7–4.0)
MCH: 30.3 pg (ref 26.0–34.0)
MCHC: 34.7 g/dL (ref 30.0–36.0)
MCV: 87.3 fL (ref 80.0–100.0)
Monocytes Absolute: 1.1 10*3/uL — ABNORMAL HIGH (ref 0.1–1.0)
Monocytes Relative: 29 %
Neutro Abs: 1 10*3/uL — ABNORMAL LOW (ref 1.7–7.7)
Neutrophils Relative %: 28 %
Platelet Count: 507 10*3/uL — ABNORMAL HIGH (ref 150–400)
RBC: 3.14 MIL/uL — ABNORMAL LOW (ref 3.87–5.11)
RDW: 21 % — ABNORMAL HIGH (ref 11.5–15.5)
WBC Count: 3.6 10*3/uL — ABNORMAL LOW (ref 4.0–10.5)
nRBC: 0 % (ref 0.0–0.2)

## 2021-10-28 LAB — CMP (CANCER CENTER ONLY)
ALT: 45 U/L — ABNORMAL HIGH (ref 0–44)
AST: 47 U/L — ABNORMAL HIGH (ref 15–41)
Albumin: 2.6 g/dL — ABNORMAL LOW (ref 3.5–5.0)
Alkaline Phosphatase: 96 U/L (ref 38–126)
Anion gap: 8 (ref 5–15)
BUN: 17 mg/dL (ref 8–23)
CO2: 26 mmol/L (ref 22–32)
Calcium: 8.4 mg/dL — ABNORMAL LOW (ref 8.9–10.3)
Chloride: 98 mmol/L (ref 98–111)
Creatinine: 1.03 mg/dL — ABNORMAL HIGH (ref 0.44–1.00)
GFR, Estimated: 57 mL/min — ABNORMAL LOW (ref >60)
Glucose, Bld: 135 mg/dL — ABNORMAL HIGH (ref 70–99)
Potassium: 3.7 mmol/L (ref 3.5–5.1)
Sodium: 132 mmol/L — ABNORMAL LOW (ref 135–145)
Total Bilirubin: 0.6 mg/dL (ref 0.3–1.2)
Total Protein: 5.6 g/dL — ABNORMAL LOW (ref 6.5–8.1)

## 2021-10-28 MED ORDER — HEPARIN SOD (PORK) LOCK FLUSH 100 UNIT/ML IV SOLN
500.0000 [IU] | Freq: Once | INTRAVENOUS | Status: AC
  Administered 2021-10-28: 500 [IU] via INTRAVENOUS

## 2021-10-28 MED ORDER — SODIUM CHLORIDE 0.9% FLUSH
10.0000 mL | Freq: Once | INTRAVENOUS | Status: AC
  Administered 2021-10-28: 10 mL via INTRAVENOUS

## 2021-10-28 MED ORDER — SODIUM CHLORIDE 0.9 % IV SOLN: INTRAVENOUS | Status: AC

## 2021-10-28 NOTE — Patient Instructions (Addendum)
Rehydration, Elderly Rehydration is the replacement of body fluids, salts, and minerals (electrolytes) that are lost during dehydration. Dehydration is when there is not enough water or other fluids in the body. This happens when you lose more fluids than you take in. People who are age 76 or older have a higher risk of dehydration than younger adults. Common causes of dehydration include: Conditions that cause loss of water or other fluids, such as diarrhea, vomiting, sweating, or urinating a lot. Not drinking enough fluids. This can occur when you are ill or doing activities that require a lot of energy, especially in hot weather. Other illnesses and conditions, such as fever or infection. Certain medicines, such as those that remove excess fluid from the body (diuretics). Not being able to get enough water and food. Symptoms of mild or moderate dehydration may include thirst, dry lips and mouth, and dizziness. Symptoms of severe dehydration may include increased heart rate, confusion, fainting, and not urinating. For severe dehydration, you may need to get fluids through an IV at the hospital. For mild or moderate dehydration, you can usually rehydrate at home by drinking certain fluids as told by your health care provider. What are the risks? Generally, rehydration is safe. However, taking in too much fluid (overhydration) can be a problem. This is rare. Overhydration can cause an electrolyte imbalance, kidney failure, fluid in the lungs, or a decrease in salt (sodium) levels in the body. Supplies needed: You will need an oral rehydration solution (ORS) if your health care provider tells you to use one. This is a drink designed to treat dehydration. It can be found in pharmacies and retail stores. How to rehydrate Fluids Follow instructions from your health care provider for rehydration. The kind of fluid and the amount you should drink depend on your condition. In general, for mild dehydration,  you should choose drinks that you prefer. If told by your health care provider, drink an ORS. Make an ORS by following instructions on the package. Start by drinking small amounts, about  cup (120 mL) every 5-10 minutes. Slowly increase how much you drink until you have taken the amount recommended by your health care provider. Drink enough fluids to keep your urine pale yellow. If you were told to drink an ORS, finish the ORS first, then start slowly drinking other clear fluids. Drink fluids such as: Water. This includes sparkling water and flavored water. Drinking only waterwhile rehydrating can lead to having too little sodium in your body (hyponatremia). Follow instructions from your health care provider. Water from ice chips you suck on. Fruit juice with water you add to it(diluted). Sports drinks. Hot or cold herbal teas. Broth-based soups. Coffee. Milk or milk products. Food Follow instructions from your health care provider about what to eat while you rehydrate. Your health care provider may recommend that you slowly begin eating regular foods in small amounts. Eat foods that contain a healthy balance of electrolytes, such as bananas, oranges, potatoes, tomatoes, and spinach. Avoid foods that are greasy or contain a lot of sugar. In some cases, you may get nutrition through a feeding tube that is passed through your nose and into your stomach (nasogastric tube, or NG tube). This may be done if you have uncontrolled vomiting or diarrhea. Beverages to avoid  Certain beverages may make dehydration worse. While you rehydrate, avoid drinking alcohol. How to tell if you are recovering from dehydration You may be recovering from dehydration if: You are urinating more often than before   you started rehydrating. Your urine is pale yellow. Your energy level improves. You vomit less frequently. You have diarrhea less frequently. Your appetite improves or returns to normal. You feel less  dizzy or less light-headed. Your skin tone and color start to look more normal. Follow these instructions at home: Take over-the-counter and prescription medicines only as told by your health care provider. Do not take sodium tablets. Doing this can lead to having too much sodium in your body (hypernatremia). Contact a health care provider if: You continue to have symptoms of mild or moderate dehydration, such as: Thirst. Dry lips. Slightly dry mouth. Dizziness. Dark urine or less urine than usual. Muscle cramps. You continue to vomit or have diarrhea. Get help right away if you: Have symptoms of dehydration that get worse. Have a fever. Have a severe headache. Have been vomiting and the following happens: Your vomiting gets worse. Your vomit includes blood or green matter (bile). You cannot eat or drink without vomiting. Have problems with urination or bowel movements, such as: Diarrhea that gets worse. Blood in your stool (feces). This may cause stool to look black and tarry. Not urinating, or urinating only a small amount of very dark urine, within 6-8 hours. Have trouble breathing. Have symptoms that get worse with treatment. These symptoms may represent a serious problem that is an emergency. Do not wait to see if the symptoms will go away. Get medical help right away. Call your local emergency services (911 in the [place] Do not drive yourself to the hospital. Summary Rehydration is the replacement of body fluids, salts, and minerals (electrolytes) that are lost during dehydration. Follow instructions from your health care provider for rehydration. The kind of fluid and the amount you should drink depend on your condition. Slowly increase how much you drink until you have taken the amount recommended by your health care provider. Contact your health care provider if you continue to show signs of mild or moderate dehydration. This information is not intended to replace advice  given to you by your health care provider. Make sure you discuss any questions you have with your health care provider. Document Revised: 04/24/2019 Document Reviewed: 04/11/2019 Elsevier Patient Education  2023 Elsevier Inc.  

## 2021-10-28 NOTE — Progress Notes (Signed)
Patient seen by Ned Card NP today  Vitals are within treatment parameters.  Labs reviewed by Ned Card NP and are not all within treatment parameters. ANC 1.0  Per physician team, patient will not be receiving treatment today. Due to low ANC and failure to thrive. Will scan and and then regroup.

## 2021-10-28 NOTE — Progress Notes (Signed)
Tonya Jenkins OFFICE PROGRESS NOTE   Diagnosis: Pancreas cancer  INTERVAL HISTORY:   Tonya Jenkins returns as scheduled.  She completed cycle 5 gemcitabine/Abraxane 10/14/2021.  She continues to have nausea and diarrhea.  She takes Zofran periodically and notes improvement in the nausea.  She has not tried Imodium.  At present she is not taking pancreatic enzyme replacement due to the cost.  Appetite is poor.  She continues to lose weight.  A friend accompanied her to today's visit and reports she is remaining in bed most of the day.  No fever or chills.  Objective:  Vital signs in last 24 hours:  Blood pressure (!) 140/75, pulse 86, temperature 98.1 F (36.7 C), temperature source Oral, resp. rate 20, height 5' (1.524 m), weight 109 lb 12.8 oz (49.8 kg), SpO2 100 %.    HEENT: No thrush or ulcers. Resp: Lungs clear bilaterally. Cardio: Regular rate and rhythm. GI: Abdomen is soft.  No hepatomegaly.  No mass.  Tender at the left upper abdomen. Vascular: No leg edema. Neuro: Alert and oriented. Skin: No rash. Port-A-Cath without erythema.   Lab Results:  Lab Results  Component Value Date   WBC 3.6 (L) 10/28/2021   HGB 9.5 (L) 10/28/2021   HCT 27.4 (L) 10/28/2021   MCV 87.3 10/28/2021   PLT 507 (H) 10/28/2021   NEUTROABS 1.0 (L) 10/28/2021    Imaging:  No results found.  Medications: I have reviewed the patient's current medications.  Assessment/Plan: Pancreas cancer, stage III (pT2pN2) CT abdomen/pelvis 05/12/2021-dilated intrahepatic and [place] bile ducts, abrupt narrowing of the common bile duct and pancreatic duct at the level of pancreas head, distended gallbladder MRI/MRCP 05/13/2018 23-3.6 x 2.1 x 4.2 cm enhancing mass in the head of the pancreas with obstruction of the pancreatic duct and common bile duct, no lymphadenopathy, no liver lesions Internal/external biliary drain 05/14/2021 CT chest 05/14/2021-small right pleural effusion, no evidence of  metastatic disease Thoracentesis 05/16/2021-negative cytology Pancreaticoduodenectomy at [facility] 06/22/2021-no evidence of metastatic disease, inflamed gallbladder with percutaneous biliary drain, large pancreas head mass-gallbladder with acute cholecystitis and abscess formation negative for malignancy.  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, negative resection margins, 6/36 lymph nodes with metastatic adenocarcinoma, lymphovascular and perineural invasion CT angio chest and CT abdomen/pelvis 08/04/2021-negative for pulmonary embolism, status post Whipple procedure, 11 mm soft tissue nodule along the jejunal mesentery-nonspecific Cycle 1 gemcitabine/Abraxane 08/19/2021 Cycle 2 gemcitabine/Abraxane 09/02/2021 Cycle 3 gemcitabine/Abraxane 09/16/2021 Cycle 4 gemcitabine/Abraxane 09/30/2021 Cycle 5 gemcitabine/Abraxane 10/14/2021 Postoperative wound dehiscence-status post fascial closure and placement of a wound VAC 07/02/2021 Chronic numbness of the right lower leg and foot Hypertension Left leg edema-negative bilateral venous Doppler 08/19/2021 Diarrhea-likely secondary to pancreas insufficiency, Creon started 09/15/2021  Disposition: Tonya Jenkins is currently receiving adjuvant chemotherapy with gemcitabine/Abraxane.  She has completed 5 cycles.  Her performance status continues to decline.  She has anorexia/weight loss.  She has persistent nausea and diarrhea.  In addition CBC from today shows mild neutropenia.  We decided to hold today's treatment and refer for CT scans.  She will receive a liter of IV fluids while in the office today.  Etiology of the persistent nausea and diarrhea unclear.  Question if related to Whipple surgery.  We again discussed trying Imodium for the diarrhea.  She agrees to try 1 Imodium tablet a day.  She is not taking Creon due to the cost.  We are working on patient assistance for Creon.  She will bring the needed paperwork to her  next office visit.  She will  try taking Zofran twice a day.  Plan for CTs 11/02/2021.  We will see her in follow-up on 11/05/2021.  Plan reviewed with Dr. Benay Spice.    Ned Card ANP/GNP-BC   10/28/2021  10:25 AM

## 2021-11-02 ENCOUNTER — Ambulatory Visit (HOSPITAL_COMMUNITY)
Admission: RE | Admit: 2021-11-02 | Discharge: 2021-11-02 | Disposition: A | Discharge status: Home / Self Care | Payer: Medicare Other | Source: Ambulatory Visit | Attending: Nurse Practitioner | Admitting: Nurse Practitioner

## 2021-11-02 DIAGNOSIS — C250 Malignant neoplasm of head of pancreas: Secondary | ICD-10-CM | POA: Insufficient documentation

## 2021-11-02 MED ORDER — HEPARIN SOD (PORK) LOCK FLUSH 100 UNIT/ML IV SOLN
500.0000 [IU] | Freq: Once | INTRAVENOUS | Status: AC
  Administered 2021-11-02: 500 [IU] via INTRAVENOUS

## 2021-11-02 MED ORDER — IOHEXOL 300 MG/ML  SOLN
100.0000 mL | Freq: Once | INTRAMUSCULAR | Status: AC | PRN
  Administered 2021-11-02: 100 mL via INTRAVENOUS

## 2021-11-02 MED ORDER — SODIUM CHLORIDE (PF) 0.9 % IJ SOLN
INTRAMUSCULAR | Status: AC
  Filled 2021-11-02: qty 50

## 2021-11-02 MED ORDER — HEPARIN SOD (PORK) LOCK FLUSH 100 UNIT/ML IV SOLN
INTRAVENOUS | Status: AC
  Filled 2021-11-02: qty 5

## 2021-11-05 ENCOUNTER — Encounter: Payer: Self-pay | Admitting: Nurse Practitioner

## 2021-11-05 ENCOUNTER — Inpatient Hospital Stay: Payer: Medicare Other | Attending: Physician Assistant

## 2021-11-05 ENCOUNTER — Inpatient Hospital Stay: Payer: Medicare Other

## 2021-11-05 ENCOUNTER — Other Ambulatory Visit: Payer: Self-pay

## 2021-11-05 ENCOUNTER — Telehealth: Payer: Self-pay

## 2021-11-05 ENCOUNTER — Inpatient Hospital Stay (HOSPITAL_BASED_OUTPATIENT_CLINIC_OR_DEPARTMENT_OTHER): Payer: Medicare Other | Admitting: Nurse Practitioner

## 2021-11-05 ENCOUNTER — Ambulatory Visit (HOSPITAL_BASED_OUTPATIENT_CLINIC_OR_DEPARTMENT_OTHER): Payer: Medicare Other

## 2021-11-05 VITALS — BP 139/73 | HR 85 | Temp 98.1°F | Resp 20 | Ht 60.0 in | Wt 110.0 lb

## 2021-11-05 DIAGNOSIS — C250 Malignant neoplasm of head of pancreas: Secondary | ICD-10-CM

## 2021-11-05 DIAGNOSIS — C779 Secondary and unspecified malignant neoplasm of lymph node, unspecified: Secondary | ICD-10-CM | POA: Insufficient documentation

## 2021-11-05 DIAGNOSIS — I3139 Other pericardial effusion (noninflammatory): Secondary | ICD-10-CM | POA: Insufficient documentation

## 2021-11-05 DIAGNOSIS — R110 Nausea: Secondary | ICD-10-CM | POA: Insufficient documentation

## 2021-11-05 LAB — ECHOCARDIOGRAM COMPLETE
Area-P 1/2: 3.4 cm2
Height: 60 in
P 1/2 time: 414 msec
S' Lateral: 1.8 cm
Weight: 1760 oz

## 2021-11-05 LAB — CMP (CANCER CENTER ONLY)
ALT: 49 U/L — ABNORMAL HIGH (ref 0–44)
AST: 60 U/L — ABNORMAL HIGH (ref 15–41)
Albumin: 2.6 g/dL — ABNORMAL LOW (ref 3.5–5.0)
Alkaline Phosphatase: 137 U/L — ABNORMAL HIGH (ref 38–126)
Anion gap: 9 (ref 5–15)
BUN: 14 mg/dL (ref 8–23)
CO2: 27 mmol/L (ref 22–32)
Calcium: 8.3 mg/dL — ABNORMAL LOW (ref 8.9–10.3)
Chloride: 99 mmol/L (ref 98–111)
Creatinine: 0.93 mg/dL (ref 0.44–1.00)
GFR, Estimated: >60 mL/min (ref >60)
Glucose, Bld: 133 mg/dL — ABNORMAL HIGH (ref 70–99)
Potassium: 3.7 mmol/L (ref 3.5–5.1)
Sodium: 135 mmol/L (ref 135–145)
Total Bilirubin: 0.9 mg/dL (ref 0.3–1.2)
Total Protein: 5.6 g/dL — ABNORMAL LOW (ref 6.5–8.1)

## 2021-11-05 LAB — CBC WITH DIFFERENTIAL (CANCER CENTER ONLY)
Abs Immature Granulocytes: 0.07 10*3/uL (ref 0.00–0.07)
Basophils Absolute: 0.1 10*3/uL (ref 0.0–0.1)
Basophils Relative: 1 %
Eosinophils Absolute: 0.2 10*3/uL (ref 0.0–0.5)
Eosinophils Relative: 2 %
HCT: 28.3 % — ABNORMAL LOW (ref 36.0–46.0)
Hemoglobin: 9.7 g/dL — ABNORMAL LOW (ref 12.0–15.0)
Immature Granulocytes: 1 %
Lymphocytes Relative: 15 %
Lymphs Abs: 1.6 10*3/uL (ref 0.7–4.0)
MCH: 30.3 pg (ref 26.0–34.0)
MCHC: 34.3 g/dL (ref 30.0–36.0)
MCV: 88.4 fL (ref 80.0–100.0)
Monocytes Absolute: 1.1 10*3/uL — ABNORMAL HIGH (ref 0.1–1.0)
Monocytes Relative: 10 %
Neutro Abs: 7.8 10*3/uL — ABNORMAL HIGH (ref 1.7–7.7)
Neutrophils Relative %: 71 %
Platelet Count: 483 10*3/uL — ABNORMAL HIGH (ref 150–400)
RBC: 3.2 MIL/uL — ABNORMAL LOW (ref 3.87–5.11)
RDW: 22.8 % — ABNORMAL HIGH (ref 11.5–15.5)
WBC Count: 10.8 10*3/uL — ABNORMAL HIGH (ref 4.0–10.5)
nRBC: 0 % (ref 0.0–0.2)

## 2021-11-05 NOTE — Progress Notes (Signed)
Patient seen by Lisa Thomas NP today  Vitals are within treatment parameters.  Labs reviewed by Lisa Thomas NP and are within treatment parameters.  Per physician team, patient will not be receiving treatment today.  

## 2021-11-05 NOTE — Progress Notes (Signed)
[place] OFFICE PROGRESS NOTE   Diagnosis: Pancreas cancer  INTERVAL HISTORY:   Ms. Delia returns as scheduled.  She completed cycle 5 gemcitabine/Abraxane 10/14/2021.  Treatment held on 10/28/2021 due to neutropenia, failure to thrive with anorexia/weight loss, persistent nausea and diarrhea.  She reports continued fatigue.  She estimates spending 80% of the day in bed.  She gets up to go to the bathroom and eat.  She is taking 1 Imodium every other day.  She continues to have diarrhea about every 2-1/2 hours.  She has consistent nausea.  No fever or rash.  Objective:  Vital signs in last 24 hours:  Blood pressure 139/73, pulse 85, temperature 98.1 F (36.7 C), temperature source Oral, resp. rate 20, height 5' (1.524 m), weight 110 lb (49.9 kg), SpO2 95 %.    HEENT: No thrush or ulcers. Resp: Lungs clear bilaterally. Cardio: Regular rate and rhythm. GI: Abdomen is soft with scattered areas of mild tenderness.  No hepatomegaly. Vascular: No leg edema. Neuro: Alert and oriented.  Port-A-Cath without erythema.  Lab Results:  Lab Results  Component Value Date   WBC 10.8 (H) 11/05/2021   HGB 9.7 (L) 11/05/2021   HCT 28.3 (L) 11/05/2021   MCV 88.4 11/05/2021   PLT 483 (H) 11/05/2021   NEUTROABS 7.8 (H) 11/05/2021    Imaging:  No results found.  Medications: I have reviewed the patient's current medications.  Assessment/Plan: Pancreas cancer, stage III (pT2pN2) CT abdomen/pelvis 05/12/2021-dilated intrahepatic and [place] bile ducts, abrupt narrowing of the common bile duct and pancreatic duct at the level of pancreas head, distended gallbladder MRI/MRCP 05/13/2018 23-3.6 x 2.1 x 4.2 cm enhancing mass in the head of the pancreas with obstruction of the pancreatic duct and common bile duct, no lymphadenopathy, no liver lesions Internal/external biliary drain 05/14/2021 CT chest 05/14/2021-small right pleural effusion, no evidence of metastatic  disease Thoracentesis 05/16/2021-negative cytology Pancreaticoduodenectomy at [facility] 06/22/2021-no evidence of metastatic disease, inflamed gallbladder with percutaneous biliary drain, large pancreas head mass-gallbladder with acute cholecystitis and abscess formation negative for malignancy.  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, negative resection margins, 6/36 lymph nodes with metastatic adenocarcinoma, lymphovascular and perineural invasion CT angio chest and CT abdomen/pelvis 08/04/2021-negative for pulmonary embolism, status post Whipple procedure, 11 mm soft tissue nodule along the jejunal mesentery-nonspecific Cycle 1 gemcitabine/Abraxane 08/19/2021 Cycle 2 gemcitabine/Abraxane 09/02/2021 Cycle 3 gemcitabine/Abraxane 09/16/2021 Cycle 4 gemcitabine/Abraxane 09/30/2021 Cycle 5 gemcitabine/Abraxane 10/14/2021 Treatment held 10/28/2021 due to neutropenia, failure to thrive/poor performance status CTs 11/02/2021-stable abdominal retroperitoneal soft tissue thickening and central mesenteric lymph node; a few new tiny upper lobe pulmonary nodules; small to moderate pericardial effusion, increased; severe hepatic steatosis. Postoperative wound dehiscence-status post fascial closure and placement of a wound VAC 07/02/2021 Chronic numbness of the right lower leg and foot Hypertension Left leg edema-negative bilateral venous Doppler 08/19/2021 Diarrhea-likely secondary to pancreas insufficiency, Creon started 09/15/2021    Disposition: Tonya Jenkins has completed 5 cycles of gemcitabine/Abraxane.  Treatment was held last week due to neutropenia and continued poor performance status/failure to thrive with anorexia and weight loss.  She has persistent nausea and diarrhea.  There has been no improvement in her symptoms despite holding chemotherapy.  She understands her performance status is not adequate to continue chemotherapy at this time.  Our plan is to reevaluate in approximately 3  weeks.  She was referred for CT scans recently.  There was stable abdominal retroperitoneal soft tissue thickening and a stable central mesenteric lymph node; a few  tiny upper lobe pulmonary nodules, considered nonspecific; small to moderate pericardial effusion which was increased.  We are referring her for a 2D echo to follow-up the pericardial effusion.  She will try to work on nutrition and activity over the next several weeks.  She will return for follow-up in 3 weeks.  Patient seen with Dr. Benay Spice.    Ned Card ANP/GNP-BC   11/05/2021  11:37 AM  Ms. Bochicchio was interviewed and examined.  She has completed 5 cycles of adjuvant gemcitabine/Abraxane.  She has a poor performance status.  I do not recommend further chemotherapy unless her performance status improves.  We will refer her for an echocardiogram to follow-up on the pericardial effusion noted on CT.  I was present for greater than 50% of today's visit.  I performed medical decision making.  Julieanne Manson, MD

## 2021-11-05 NOTE — Telephone Encounter (Signed)
Echo is scan today at [address] at the [facility] [address]. Patient is aware of the appointment.

## 2021-11-06 ENCOUNTER — Other Ambulatory Visit: Payer: Self-pay

## 2021-11-10 ENCOUNTER — Telehealth: Payer: Self-pay

## 2021-11-10 DIAGNOSIS — I3139 Other pericardial effusion (noninflammatory): Secondary | ICD-10-CM

## 2021-11-10 NOTE — Telephone Encounter (Signed)
-----   Message from Owens Shark, NP sent at 11/10/2021  4:14 PM EDT ----- Please let her know the 2D echo showed a small to moderate pericardial effusion.  Does she have a cardiologist?  If so, please schedule her an appointment with cardiologist.  If not, please schedule her for a repeat 2D echo in 4 weeks.

## 2021-11-10 NOTE — Telephone Encounter (Signed)
Having abdominal pain along incision line for a few days pain 3-4 on pain scale. Worse when touches area. No redness, no warmth, no fever. Abdomen is distended. Having 4-5 loose. No cramping, no vomiting, eating well yellow/orange stools daily. Eyes not yellow, but her nails are yellow. Urine light yellow.  Discussed with Lattie Haw. Should call her surgeon for the pain.  Dark brown spot on her leg pea size. Not raised, no itching, no pain.  Patient notified of test results. Does not have a cardiologist. Would like her repeat echo not on a Thursday. Verbalizes understanding and agrees to the plan. Will call for further concerns.

## 2021-11-11 ENCOUNTER — Other Ambulatory Visit: Payer: Self-pay | Admitting: Nurse Practitioner

## 2021-11-11 ENCOUNTER — Inpatient Hospital Stay: Payer: Medicare Other

## 2021-11-11 ENCOUNTER — Inpatient Hospital Stay: Payer: Medicare Other | Admitting: Oncology

## 2021-11-11 ENCOUNTER — Encounter: Payer: Self-pay | Admitting: Oncology

## 2021-11-11 DIAGNOSIS — C250 Malignant neoplasm of head of pancreas: Secondary | ICD-10-CM

## 2021-11-11 NOTE — Telephone Encounter (Signed)
Notified Ms. Alverio of ECHO at [facility] on 9/22 at 12:45/1 pm. NO prep.

## 2021-11-25 ENCOUNTER — Inpatient Hospital Stay: Payer: Medicare Other | Admitting: Nurse Practitioner

## 2021-11-25 ENCOUNTER — Inpatient Hospital Stay: Payer: Medicare Other

## 2021-11-26 ENCOUNTER — Ambulatory Visit (INDEPENDENT_AMBULATORY_CARE_PROVIDER_SITE_OTHER): Payer: Medicare Other

## 2021-11-26 DIAGNOSIS — I3139 Other pericardial effusion (noninflammatory): Secondary | ICD-10-CM | POA: Diagnosis not present

## 2021-11-26 LAB — ECHOCARDIOGRAM LIMITED
AV Mean grad: 2 mmHg
AV Peak grad: 3.9 mmHg
Ao pk vel: 0.99 m/s
Area-P 1/2: 2.3 cm2
S' Lateral: 1.93 cm
Single Plane A2C EF: 70.5 %

## 2021-11-29 ENCOUNTER — Inpatient Hospital Stay (HOSPITAL_BASED_OUTPATIENT_CLINIC_OR_DEPARTMENT_OTHER): Payer: Medicare Other | Admitting: Nurse Practitioner

## 2021-11-29 ENCOUNTER — Inpatient Hospital Stay: Payer: Medicare Other

## 2021-11-29 ENCOUNTER — Encounter: Payer: Self-pay | Admitting: Nurse Practitioner

## 2021-11-29 ENCOUNTER — Encounter: Payer: Self-pay | Admitting: *Deleted

## 2021-11-29 VITALS — BP 155/94 | HR 80 | Temp 98.1°F | Resp 20 | Ht 60.0 in | Wt 104.1 lb

## 2021-11-29 DIAGNOSIS — C250 Malignant neoplasm of head of pancreas: Secondary | ICD-10-CM | POA: Diagnosis not present

## 2021-11-29 DIAGNOSIS — Z95828 Presence of other vascular implants and grafts: Secondary | ICD-10-CM

## 2021-11-29 LAB — CMP (CANCER CENTER ONLY)
ALT: 49 U/L — ABNORMAL HIGH (ref 0–44)
AST: 67 U/L — ABNORMAL HIGH (ref 15–41)
Albumin: 2.9 g/dL — ABNORMAL LOW (ref 3.5–5.0)
Alkaline Phosphatase: 107 U/L (ref 38–126)
Anion gap: 8 (ref 5–15)
BUN: 12 mg/dL (ref 8–23)
CO2: 28 mmol/L (ref 22–32)
Calcium: 8.2 mg/dL — ABNORMAL LOW (ref 8.9–10.3)
Chloride: 99 mmol/L (ref 98–111)
Creatinine: 0.87 mg/dL (ref 0.44–1.00)
GFR, Estimated: >60 mL/min (ref >60)
Glucose, Bld: 178 mg/dL — ABNORMAL HIGH (ref 70–99)
Potassium: 3.6 mmol/L (ref 3.5–5.1)
Sodium: 135 mmol/L (ref 135–145)
Total Bilirubin: 0.6 mg/dL (ref 0.3–1.2)
Total Protein: 5.5 g/dL — ABNORMAL LOW (ref 6.5–8.1)

## 2021-11-29 LAB — CBC WITH DIFFERENTIAL (CANCER CENTER ONLY)
Abs Immature Granulocytes: 0.01 10*3/uL (ref 0.00–0.07)
Basophils Absolute: 0.1 10*3/uL (ref 0.0–0.1)
Basophils Relative: 2 %
Eosinophils Absolute: 0.4 10*3/uL (ref 0.0–0.5)
Eosinophils Relative: 7 %
HCT: 29.8 % — ABNORMAL LOW (ref 36.0–46.0)
Hemoglobin: 9.8 g/dL — ABNORMAL LOW (ref 12.0–15.0)
Immature Granulocytes: 0 %
Lymphocytes Relative: 25 %
Lymphs Abs: 1.3 10*3/uL (ref 0.7–4.0)
MCH: 32.3 pg (ref 26.0–34.0)
MCHC: 32.9 g/dL (ref 30.0–36.0)
MCV: 98.3 fL (ref 80.0–100.0)
Monocytes Absolute: 0.5 10*3/uL (ref 0.1–1.0)
Monocytes Relative: 9 %
Neutro Abs: 3.1 10*3/uL (ref 1.7–7.7)
Neutrophils Relative %: 57 %
Platelet Count: 344 10*3/uL (ref 150–400)
RBC: 3.03 MIL/uL — ABNORMAL LOW (ref 3.87–5.11)
RDW: 16.3 % — ABNORMAL HIGH (ref 11.5–15.5)
WBC Count: 5.3 10*3/uL (ref 4.0–10.5)
nRBC: 0 % (ref 0.0–0.2)

## 2021-11-29 MED ORDER — HEPARIN SOD (PORK) LOCK FLUSH 100 UNIT/ML IV SOLN
500.0000 [IU] | Freq: Once | INTRAVENOUS | Status: AC
  Administered 2021-11-29: 500 [IU] via INTRAVENOUS

## 2021-11-29 MED ORDER — SODIUM CHLORIDE 0.9% FLUSH
10.0000 mL | INTRAVENOUS | Status: AC | PRN
  Administered 2021-11-29: 10 mL via INTRAVENOUS

## 2021-11-29 NOTE — Progress Notes (Signed)
Patient seen by Lisa Thomas NP today  Vitals are within treatment parameters.  Labs reviewed by Lisa Thomas NP and are within treatment parameters.  Per physician team, patient will not be receiving treatment today.  

## 2021-11-29 NOTE — Progress Notes (Signed)
Appointment with Dr Shon Hough for October 12,2023 at 3:30

## 2021-11-29 NOTE — Progress Notes (Signed)
[place] OFFICE PROGRESS NOTE   Diagnosis: Pancreas cancer  INTERVAL HISTORY:   Ms. Apsey returns as scheduled.  Chemotherapy has been on hold due to poor performance status.  She remains in bed or on the couch about 90% of the day.  She feels "tired".  She is eating small frequent meals but continues to lose weight.  No consistent abdominal pain.  She continues to have beige-colored bowel movements every 2 hours.  She has continual low-grade nausea.   Objective:  Vital signs in last 24 hours:  Blood pressure (!) 155/94, pulse 80, temperature 98.1 F (36.7 C), temperature source Oral, resp. rate 20, height 5' (1.524 m), weight 104 lb 1.6 oz (47.2 kg), SpO2 98 %.    HEENT: No thrush or ulcers. Resp: Lungs clear bilaterally. Cardio: Regular rate and rhythm. GI: No hepatosplenomegaly. Vascular: No leg edema. Neuro: Alert and oriented. Port-A-Cath without erythema.  Lab Results:  Lab Results  Component Value Date   WBC 5.3 11/29/2021   HGB 9.8 (L) 11/29/2021   HCT 29.8 (L) 11/29/2021   MCV 98.3 11/29/2021   PLT 344 11/29/2021   NEUTROABS 3.1 11/29/2021    Imaging:  No results found.  Medications: I have reviewed the patient's current medications.  Assessment/Plan: Pancreas cancer, stage III (pT2pN2) CT abdomen/pelvis 05/12/2021-dilated intrahepatic and [place] bile ducts, abrupt narrowing of the common bile duct and pancreatic duct at the level of pancreas head, distended gallbladder MRI/MRCP 05/13/2018 23-3.6 x 2.1 x 4.2 cm enhancing mass in the head of the pancreas with obstruction of the pancreatic duct and common bile duct, no lymphadenopathy, no liver lesions Internal/external biliary drain 05/14/2021 CT chest 05/14/2021-small right pleural effusion, no evidence of metastatic disease Thoracentesis 05/16/2021-negative cytology Pancreaticoduodenectomy at [facility] 06/22/2021-no evidence of metastatic disease, inflamed gallbladder with percutaneous biliary  drain, large pancreas head mass-gallbladder with acute cholecystitis and abscess formation negative for malignancy.  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, negative resection margins, 6/36 lymph nodes with metastatic adenocarcinoma, lymphovascular and perineural invasion CT angio chest and CT abdomen/pelvis 08/04/2021-negative for pulmonary embolism, status post Whipple procedure, 11 mm soft tissue nodule along the jejunal mesentery-nonspecific Cycle 1 gemcitabine/Abraxane 08/19/2021 Cycle 2 gemcitabine/Abraxane 09/02/2021 Cycle 3 gemcitabine/Abraxane 09/16/2021 Cycle 4 gemcitabine/Abraxane 09/30/2021 Cycle 5 gemcitabine/Abraxane 10/14/2021 Treatment held 10/28/2021 due to neutropenia, failure to thrive/poor performance status CTs 11/02/2021-stable abdominal retroperitoneal soft tissue thickening and central mesenteric lymph node; a few new tiny upper lobe pulmonary nodules; small to moderate pericardial effusion, increased; severe hepatic steatosis. Postoperative wound dehiscence-status post fascial closure and placement of a wound VAC 07/02/2021 Chronic numbness of the right lower leg and foot Hypertension Left leg edema-negative bilateral venous Doppler 08/19/2021 Diarrhea-likely secondary to pancreas insufficiency, Creon started 09/15/2021 Pericardial effusion on CT 11/02/2021; 2D echo 11/05/2021-small to moderate pericardial effusion surrounds heart, most prominent along lateral surface of RV.  Does not appear to be hemodynamically significant by echo criteria.  Repeat 2D echo 11/26/2021-trivial pericardial effusion present.  The pericardial effusion is anterior to the right ventricle.      Disposition: Ms. Diven appears unchanged.  Performance status continues to be poor despite discontinuation of chemotherapy.  She has lost more weight.  She continues to have frequent bowel movements and low-grade nausea.  She has not been able to obtain pancreatic enzyme replacement due to need  for additional documents to be submitted for patient assistance.  We have discussed antidiarrheal medication which she is reluctant to take.  We will refer her to the  [place] dietitian.  She is interested in discussing her symptoms with Dr. Zenia Resides at [facility].  We will make that referral.  She plans to submit the additional documents for Creon patient assistance as soon as possible.  Chemotherapy will remain on hold.  She will return for follow-up in 2 weeks.  Patient seen with Dr. Benay Spice.    Ned Card ANP/GNP-BC   11/29/2021  11:09 AM This was a shared visit with Ned Card.  Ms. Dow was interviewed and examined.  Her performance status remains poor.  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  We encouraged her to increase her calorie intake and begin pancreatic enzyme replacement.  We will refer her to Dr. Zenia Resides.  Systemic therapy will remain on hold.  Ms. Smeltz will return for an office visit in 2 weeks.  I was present for greater than 50% of today's visit.  I performed medical decision making.  Julieanne Manson, MD

## 2021-11-29 NOTE — Addendum Note (Signed)
Addended by: Velna Hatchet on: 11/29/2021 12:02 PM   Modules accepted: Orders

## 2021-11-29 NOTE — Progress Notes (Signed)
Received fax from pharmacy noting Ms. Fiala medications and cost. Faxed this to [facility] [phone].

## 2021-11-30 ENCOUNTER — Other Ambulatory Visit (HOSPITAL_BASED_OUTPATIENT_CLINIC_OR_DEPARTMENT_OTHER): Payer: Self-pay

## 2021-12-01 ENCOUNTER — Other Ambulatory Visit (HOSPITAL_BASED_OUTPATIENT_CLINIC_OR_DEPARTMENT_OTHER): Payer: Self-pay

## 2021-12-03 ENCOUNTER — Telehealth: Payer: Self-pay | Admitting: *Deleted

## 2021-12-03 ENCOUNTER — Other Ambulatory Visit (HOSPITAL_BASED_OUTPATIENT_CLINIC_OR_DEPARTMENT_OTHER): Payer: Self-pay

## 2021-12-03 NOTE — Chronic Care Management (AMB) (Signed)
  Care Coordination   Note   12/03/2021 Name: Tonya Jenkins MRN: [id number] DOB: [birth year]  Tonya Jenkins is a 76 y.o. year old female who sees [name], MD for primary care. I reached out to Terese Door by phone today to offer care coordination services.  Tonya Jenkins was given information about Care Coordination services today including:   The Care Coordination services include support from the care team which includes your Nurse Coordinator, Clinical Social Worker, or Pharmacist.  The Care Coordination team is here to help remove barriers to the health concerns and goals most important to you. Care Coordination services are voluntary, and the patient may decline or stop services at any time by request to their care team member.   Care Coordination Consent Status: Patient agreed to services and verbal consent obtained.   Follow up plan:  Telephone appointment with care coordination team member scheduled for:  12/15/21  Encounter Outcome:  Pt. Scheduled  [place]  Direct Dial: [phone]

## 2021-12-04 ENCOUNTER — Other Ambulatory Visit: Payer: Self-pay

## 2021-12-07 ENCOUNTER — Other Ambulatory Visit (HOSPITAL_BASED_OUTPATIENT_CLINIC_OR_DEPARTMENT_OTHER): Payer: Self-pay

## 2021-12-08 ENCOUNTER — Inpatient Hospital Stay: Payer: Medicare Other | Attending: Physician Assistant | Admitting: Nutrition

## 2021-12-08 ENCOUNTER — Encounter: Payer: Self-pay | Admitting: *Deleted

## 2021-12-08 ENCOUNTER — Telehealth: Payer: Self-pay | Admitting: Nutrition

## 2021-12-08 DIAGNOSIS — C250 Malignant neoplasm of head of pancreas: Secondary | ICD-10-CM | POA: Insufficient documentation

## 2021-12-08 DIAGNOSIS — C779 Secondary and unspecified malignant neoplasm of lymph node, unspecified: Secondary | ICD-10-CM | POA: Insufficient documentation

## 2021-12-08 DIAGNOSIS — Z23 Encounter for immunization: Secondary | ICD-10-CM | POA: Insufficient documentation

## 2021-12-08 NOTE — Telephone Encounter (Signed)
Telephone follow up completed with patient.  Patient diagnosed with Pancreas cancer. Treatment on hold.  Patient continues to feel weak. Reports diarrhea ~ 5 times daily. Takes one imodium daily for her diarrhea after she eats something. States she can eat almost anything as long as she takes her nausea medications. Mentions she has been eating fish, seafood, casseroles with beef, soups, lasagna, yogurt and cottage cheese. A recent favorite has been a milkshake with peanut butter and protein powder.  Reports she weights about 100 pounds on her home scale. Weight documented as 104 lb 1.6 oz on Sept 25. This equals a 17% wt loss over three months, which is significant.  Nutrition Diagnosis: Unintended wt loss continues.  Intervention: Educated to continue small, frequent meals/snacks with high calorie, high protein foods. Explained how persistent diarrhea can lead to wt loss and fatigue. Encouraged bowel regimen and taking imodium per provider. Consider increasing Creon to help with diarrhea. E-mail nutrition fact sheets to patient with contact information.  Monitoring, Evaluation, Goals: Patient will tolerate increased calorie and protein to minimize wt loss.  Next Visit: Will follow up as needed.

## 2021-12-08 NOTE — Progress Notes (Signed)
PATIENT NAVIGATOR PROGRESS NOTE  Name: Dayelin Balducci Date: 12/08/2021 MRN: [id number]  DOB: August 14, 1945   Reason for visit:  Request from dietician to clarify her Creon and Imodium  Comments:  Spoke with Ms Gatton and she describes her bowel movements as pudding consistency every 2 hours, just a small amount each time She is currently taking Creon 1 capsule 4 times a day with first bite of food and states that she can tell when she doesn't take it that stools are softer. She states that she is taking imodium 1 capsule once a day. Appetite is okay and that she is trying to walk daily in driveway  Per Ned Card NP instructed her to increase imodium to twice a day and continue Creon 4x daily with first bite of food.  Verbalized understanding    Time spent counseling/coordinating care: 45-60 minutes

## 2021-12-09 ENCOUNTER — Ambulatory Visit: Payer: Medicare Other | Admitting: Oncology

## 2021-12-09 ENCOUNTER — Ambulatory Visit: Payer: Medicare Other

## 2021-12-09 ENCOUNTER — Other Ambulatory Visit: Payer: Medicare Other

## 2021-12-13 ENCOUNTER — Inpatient Hospital Stay: Payer: Medicare Other

## 2021-12-13 ENCOUNTER — Inpatient Hospital Stay (HOSPITAL_BASED_OUTPATIENT_CLINIC_OR_DEPARTMENT_OTHER): Payer: Medicare Other | Admitting: Oncology

## 2021-12-13 VITALS — BP 140/82 | HR 73 | Temp 98.2°F | Resp 18 | Ht 60.0 in | Wt 104.6 lb

## 2021-12-13 DIAGNOSIS — C250 Malignant neoplasm of head of pancreas: Secondary | ICD-10-CM

## 2021-12-13 DIAGNOSIS — Z23 Encounter for immunization: Secondary | ICD-10-CM | POA: Diagnosis not present

## 2021-12-13 DIAGNOSIS — C779 Secondary and unspecified malignant neoplasm of lymph node, unspecified: Secondary | ICD-10-CM | POA: Diagnosis not present

## 2021-12-13 LAB — CMP (CANCER CENTER ONLY)
ALT: 36 U/L (ref 0–44)
AST: 49 U/L — ABNORMAL HIGH (ref 15–41)
Albumin: 3.2 g/dL — ABNORMAL LOW (ref 3.5–5.0)
Alkaline Phosphatase: 78 U/L (ref 38–126)
Anion gap: 8 (ref 5–15)
BUN: 16 mg/dL (ref 8–23)
CO2: 28 mmol/L (ref 22–32)
Calcium: 8.8 mg/dL — ABNORMAL LOW (ref 8.9–10.3)
Chloride: 102 mmol/L (ref 98–111)
Creatinine: 0.79 mg/dL (ref 0.44–1.00)
GFR, Estimated: >60 mL/min (ref >60)
Glucose, Bld: 123 mg/dL — ABNORMAL HIGH (ref 70–99)
Potassium: 3.4 mmol/L — ABNORMAL LOW (ref 3.5–5.1)
Sodium: 138 mmol/L (ref 135–145)
Total Bilirubin: 0.5 mg/dL (ref 0.3–1.2)
Total Protein: 6 g/dL — ABNORMAL LOW (ref 6.5–8.1)

## 2021-12-13 LAB — CBC WITH DIFFERENTIAL (CANCER CENTER ONLY)
Abs Immature Granulocytes: 0.01 10*3/uL (ref 0.00–0.07)
Basophils Absolute: 0.1 10*3/uL (ref 0.0–0.1)
Basophils Relative: 1 %
Eosinophils Absolute: 0.4 10*3/uL (ref 0.0–0.5)
Eosinophils Relative: 6 %
HCT: 31.1 % — ABNORMAL LOW (ref 36.0–46.0)
Hemoglobin: 10.4 g/dL — ABNORMAL LOW (ref 12.0–15.0)
Immature Granulocytes: 0 %
Lymphocytes Relative: 31 %
Lymphs Abs: 2 10*3/uL (ref 0.7–4.0)
MCH: 31.7 pg (ref 26.0–34.0)
MCHC: 33.4 g/dL (ref 30.0–36.0)
MCV: 94.8 fL (ref 80.0–100.0)
Monocytes Absolute: 0.4 10*3/uL (ref 0.1–1.0)
Monocytes Relative: 7 %
Neutro Abs: 3.6 10*3/uL (ref 1.7–7.7)
Neutrophils Relative %: 55 %
Platelet Count: 296 10*3/uL (ref 150–400)
RBC: 3.28 MIL/uL — ABNORMAL LOW (ref 3.87–5.11)
RDW: 13.3 % (ref 11.5–15.5)
WBC Count: 6.5 10*3/uL (ref 4.0–10.5)
nRBC: 0 % (ref 0.0–0.2)

## 2021-12-13 MED ORDER — LIDOCAINE-PRILOCAINE 2.5-2.5 % EX CREA: 1.0000 | TOPICAL_CREAM | CUTANEOUS | 0 refills | Status: AC

## 2021-12-13 MED ORDER — HEPARIN SOD (PORK) LOCK FLUSH 100 UNIT/ML IV SOLN
500.0000 [IU] | Freq: Once | INTRAVENOUS | Status: AC
  Administered 2021-12-13: 500 [IU] via INTRAVENOUS

## 2021-12-13 MED ORDER — SODIUM CHLORIDE 0.9% FLUSH
10.0000 mL | Freq: Once | INTRAVENOUS | Status: AC
  Administered 2021-12-13: 10 mL via INTRAVENOUS

## 2021-12-13 NOTE — Progress Notes (Signed)
[place] OFFICE PROGRESS NOTE   Diagnosis: Pancreas cancer  INTERVAL HISTORY:   Tonya Jenkins returns as scheduled.  She continues to feel weak.  She stays in the house most of the time.  She has a good appetite.  Diarrhea improved when she began Creon.  She continues to have bowel movements every 3 hours while on Creon.  She is scheduled to see Dr. Zenia Resides later this week.  Objective:  Vital signs in last 24 hours:  Blood pressure (!) 140/82, pulse 73, temperature 98.2 F (36.8 C), temperature source Oral, resp. rate 18, height 5' (1.524 m), weight 104 lb 9.6 oz (47.4 kg), SpO2 100 %.    HEENT: Ecchymoses at the left buccal mucosa, no thrus Resp: Lungs clear bilaterally Cardio: Regular rate and rhythm GI: No mass, no hepatosplenomegaly, mild diffuse tenderness Vascular: No leg edema   Portacath/PICC-without erythema  Lab Results:  Lab Results  Component Value Date   WBC 6.5 12/13/2021   HGB 10.4 (L) 12/13/2021   HCT 31.1 (L) 12/13/2021   MCV 94.8 12/13/2021   PLT 296 12/13/2021   NEUTROABS 3.6 12/13/2021    CMP  Lab Results  Component Value Date   NA 135 11/29/2021   K 3.6 11/29/2021   CL 99 11/29/2021   CO2 28 11/29/2021   GLUCOSE 178 (H) 11/29/2021   BUN 12 11/29/2021   CREATININE 0.87 11/29/2021   CALCIUM 8.2 (L) 11/29/2021   PROT 5.5 (L) 11/29/2021   ALBUMIN 2.9 (L) 11/29/2021   AST 67 (H) 11/29/2021   ALT 49 (H) 11/29/2021   ALKPHOS 107 11/29/2021   BILITOT 0.6 11/29/2021   GFRNONAA >60 11/29/2021    Lab Results  Component Value Date   CAN199 <2 10/14/2021     Medications: I have reviewed the patient's current medications.   Assessment/Plan: Pancreas cancer, stage III (pT2pN2) CT abdomen/pelvis 05/12/2021-dilated intrahepatic and [place] bile ducts, abrupt narrowing of the common bile duct and pancreatic duct at the level of pancreas head, distended gallbladder MRI/MRCP 05/13/2018 23-3.6 x 2.1 x 4.2 cm enhancing mass in the head  of the pancreas with obstruction of the pancreatic duct and common bile duct, no lymphadenopathy, no liver lesions Internal/external biliary drain 05/14/2021 CT chest 05/14/2021-small right pleural effusion, no evidence of metastatic disease Thoracentesis 05/16/2021-negative cytology Pancreat[facility]uodenectomy at [facility] 06/22/2021-no evidence of metastatic disease, inflamed gallbladder with percutaneous biliary drain, large pancreas head mass-gallbladder with acute cholecystitis and abscess formation negative for malignancy.  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, negative resection margins, 6/36 lymph nodes with metastatic adenocarcinoma, lymphovascular and perineural invasion CT angio chest and CT abdomen/pelvis 08/04/2021-negative for pulmonary embolism, status post Whipple procedure, 11 mm soft tissue nodule along the jejunal mesentery-nonspecific Cycle 1 gemcitabine/Abraxane 08/19/2021 Cycle 2 gemcitabine/Abraxane 09/02/2021 Cycle 3 gemcitabine/Abraxane 09/16/2021 Cycle 4 gemcitabine/Abraxane 09/30/2021 Cycle 5 gemcitabine/Abraxane 10/14/2021 Treatment held 10/28/2021 due to neutropenia, failure to thrive/poor performance status CTs 11/02/2021-stable abdominal retroperitoneal soft tissue thickening and central mesenteric lymph node; a few new tiny upper lobe pulmonary nodules; small to moderate pericardial effusion, increased; severe hepatic steatosis. Postoperative wound dehiscence-status post fascial closure and placement of a wound VAC 07/02/2021 Chronic numbness of the right lower leg and foot Hypertension Left leg edema-negative bilateral venous Doppler 08/19/2021 Diarrhea-likely secondary to pancreas insufficiency, Creon started 09/15/2021 Pericardial effusion on CT 11/02/2021; 2D echo 11/05/2021-small to moderate pericardial effusion surrounds heart, most prominent along lateral surface of RV.  Does not appear to be hemodynamically significant by echo criteria.  Repeat  2D echo  11/26/2021-trivial pericardial effusion present.  The pericardial effusion is anterior to the right ventricle.     Disposition: Tonya Jenkins appears stable.  She continues to have a poor performance status.  I suspect the failure to thrive is related to the pancreaticoduodenectomy.  We have not identified recurrent pancreas cancer to date.  She is scheduled to follow-up with Dr. Zenia Resides later this week.  I will see her on 12/22/2021 with a plan to resume gemcitabine/Abraxane if her performance status has improved.  Tonya Coder, MD  12/13/2021  9:10 AM

## 2021-12-14 ENCOUNTER — Other Ambulatory Visit: Payer: Self-pay

## 2021-12-15 ENCOUNTER — Ambulatory Visit: Payer: Self-pay

## 2021-12-15 ENCOUNTER — Other Ambulatory Visit: Payer: Self-pay

## 2021-12-15 NOTE — Patient Outreach (Signed)
  Care Coordination   Initial Visit Note   12/15/2021 Name: Tonya Jenkins MRN: [id number] DOB: [birth year]  Tonya Jenkins is a 76 y.o. year old female who sees [name], MD for primary care. I spoke with  Tonya Jenkins by phone today.  What matters to the patients health and wellness today?  I have no needs or concerns at this time.    Goals Addressed             This Visit's Progress    COMPLETED: Care Coordination Activites - no follow up required        Care Coordination Interventions: Active listening / Reflection utilized  Emotional Support Provided Problem Humphreys strategies reviewed Discussed/.Educated Care Coordination Program 2.   Discussed/.Educated Annual Wellness Visit 3.   Discussed/.Educated Social Determinates of Health 4.   Please inform PCP if services needed in the future Tonya Jenkins is currently visiting her Oncology physician and has no immediate needs. However, she may require transportation in the future. I advised her to contact us for further assistance.          SDOH assessments and interventions completed:  Yes  SDOH Interventions Today    Flowsheet Row Most Recent Value  SDOH Interventions   Food Insecurity Interventions Intervention Not Indicated  Transportation Interventions Intervention Not Indicated        Care Coordination Interventions Activated:  Yes  Care Coordination Interventions:  Yes, provided   Follow up plan: No further intervention required.   Encounter Outcome:  Pt. Visit Completed   Lazaro Arms RN, BSN, [facility]   Phone: [phone]

## 2021-12-15 NOTE — Patient Instructions (Signed)
Visit Information  Thank you for taking time to visit with me today. Please don't hesitate to contact me if I can be of assistance to you.   Following are the goals we discussed today:   Goals Addressed             This Visit's Progress    COMPLETED: Care Coordination Activites - no follow up required        Care Coordination Interventions: Active listening / Reflection utilized  Emotional Support Provided Problem [place] strategies reviewed Discussed/.Educated Care Coordination Program 2.   Discussed/.Educated Annual Wellness Visit 3.   Discussed/.Educated Social Determinates of Health 4.   Please inform PCP if services needed in the future Mrs. Boulter is currently visiting her Oncology physician and has no immediate needs. However, she may require transportation in the future. I advised her to contact us for further assistance.           If you are experiencing a Mental Health or [place] or need someone to talk to, please call 1-800-273-TALK (toll free, 24 hour hotline)  Patient verbalizes understanding of instructions and care plan provided today and agrees to view in [facility]. Active MyChart status and patient understanding of how to access instructions and care plan via MyChart confirmed with patient.     Lazaro Arms RN, BSN, [facility]   Phone: [phone]

## 2021-12-17 ENCOUNTER — Other Ambulatory Visit: Payer: Self-pay

## 2021-12-17 DIAGNOSIS — C250 Malignant neoplasm of head of pancreas: Secondary | ICD-10-CM

## 2021-12-20 ENCOUNTER — Other Ambulatory Visit: Payer: Self-pay | Admitting: Oncology

## 2021-12-20 ENCOUNTER — Other Ambulatory Visit: Payer: Self-pay

## 2021-12-22 ENCOUNTER — Inpatient Hospital Stay: Payer: Medicare Other

## 2021-12-22 ENCOUNTER — Other Ambulatory Visit: Payer: Self-pay | Admitting: *Deleted

## 2021-12-22 ENCOUNTER — Encounter: Payer: Self-pay | Admitting: *Deleted

## 2021-12-22 ENCOUNTER — Inpatient Hospital Stay (HOSPITAL_BASED_OUTPATIENT_CLINIC_OR_DEPARTMENT_OTHER): Payer: Medicare Other | Admitting: Oncology

## 2021-12-22 VITALS — BP 160/80 | HR 73 | Temp 98.1°F | Resp 18 | Ht 60.0 in | Wt 104.1 lb

## 2021-12-22 DIAGNOSIS — C250 Malignant neoplasm of head of pancreas: Secondary | ICD-10-CM

## 2021-12-22 LAB — CMP (CANCER CENTER ONLY)
ALT: 32 U/L (ref 0–44)
AST: 47 U/L — ABNORMAL HIGH (ref 15–41)
Albumin: 3.3 g/dL — ABNORMAL LOW (ref 3.5–5.0)
Alkaline Phosphatase: 85 U/L (ref 38–126)
Anion gap: 8 (ref 5–15)
BUN: 16 mg/dL (ref 8–23)
CO2: 29 mmol/L (ref 22–32)
Calcium: 8.8 mg/dL — ABNORMAL LOW (ref 8.9–10.3)
Chloride: 98 mmol/L (ref 98–111)
Creatinine: 0.84 mg/dL (ref 0.44–1.00)
GFR, Estimated: >60 mL/min (ref >60)
Glucose, Bld: 157 mg/dL — ABNORMAL HIGH (ref 70–99)
Potassium: 3.8 mmol/L (ref 3.5–5.1)
Sodium: 135 mmol/L (ref 135–145)
Total Bilirubin: 0.4 mg/dL (ref 0.3–1.2)
Total Protein: 6 g/dL — ABNORMAL LOW (ref 6.5–8.1)

## 2021-12-22 LAB — CBC WITH DIFFERENTIAL (CANCER CENTER ONLY)
Abs Immature Granulocytes: 0.01 10*3/uL (ref 0.00–0.07)
Basophils Absolute: 0.1 10*3/uL (ref 0.0–0.1)
Basophils Relative: 1 %
Eosinophils Absolute: 0.3 10*3/uL (ref 0.0–0.5)
Eosinophils Relative: 5 %
HCT: 31.8 % — ABNORMAL LOW (ref 36.0–46.0)
Hemoglobin: 10.4 g/dL — ABNORMAL LOW (ref 12.0–15.0)
Immature Granulocytes: 0 %
Lymphocytes Relative: 23 %
Lymphs Abs: 1.3 10*3/uL (ref 0.7–4.0)
MCH: 30.9 pg (ref 26.0–34.0)
MCHC: 32.7 g/dL (ref 30.0–36.0)
MCV: 94.4 fL (ref 80.0–100.0)
Monocytes Absolute: 0.5 10*3/uL (ref 0.1–1.0)
Monocytes Relative: 8 %
Neutro Abs: 3.7 10*3/uL (ref 1.7–7.7)
Neutrophils Relative %: 63 %
Platelet Count: 321 10*3/uL (ref 150–400)
RBC: 3.37 MIL/uL — ABNORMAL LOW (ref 3.87–5.11)
RDW: 13 % (ref 11.5–15.5)
WBC Count: 5.8 10*3/uL (ref 4.0–10.5)
nRBC: 0 % (ref 0.0–0.2)

## 2021-12-22 LAB — MAGNESIUM: Magnesium: 1.8 mg/dL (ref 1.7–2.4)

## 2021-12-22 MED ORDER — SODIUM CHLORIDE 0.9% FLUSH
10.0000 mL | INTRAVENOUS | Status: AC | PRN
  Administered 2021-12-22: 10 mL via INTRAVENOUS

## 2021-12-22 MED ORDER — INFLUENZA VAC A&B SA ADJ QUAD 0.5 ML IM PRSY
0.5000 mL | PREFILLED_SYRINGE | Freq: Once | INTRAMUSCULAR | Status: AC
  Administered 2021-12-22: 0.5 mL via INTRAMUSCULAR
  Filled 2021-12-22: qty 0.5

## 2021-12-22 MED ORDER — HEPARIN SOD (PORK) LOCK FLUSH 100 UNIT/ML IV SOLN
500.0000 [IU] | Freq: Once | INTRAVENOUS | Status: AC
  Administered 2021-12-22: 500 [IU] via INTRAVENOUS

## 2021-12-22 NOTE — Progress Notes (Signed)
Placed referral order for PT at [facility] for water therapy.

## 2021-12-22 NOTE — Patient Instructions (Signed)

## 2021-12-22 NOTE — Progress Notes (Signed)
[place] OFFICE PROGRESS NOTE   Diagnosis: Pancreas cancer  INTERVAL HISTORY:   Tonya Jenkins returns as scheduled.  She saw Dr. Zenia Resides on 12/16/2021.  He encouraged her to increase the use of Creon.  She was referred to the nutritionist at [facility]. Tonya Jenkins reports taking Creon more consistently, but she is not taking Creon with each meal.  She is not using nutrition supplements.  She stays in bed or on the couch for 90% of the day.  No pain.  She has up to 5 or 6 diarrhea stools per day.  Objective:  Vital signs in last 24 hours:  Blood pressure (!) 160/80, pulse 73, temperature 98.1 F (36.7 C), temperature source Oral, resp. rate 18, height 5' (1.524 m), weight 104 lb 1.6 oz (47.2 kg), SpO2 98 %.   Resp: Lungs clear bilaterally Cardio: Regular rate and rhythm GI: Mild diffuse tenderness, no hepatosplenomegaly, no mass, no apparent ascites Vascular: No leg edema  Portacath/PICC-without erythema  Lab Results:  Lab Results  Component Value Date   WBC 5.8 12/22/2021   HGB 10.4 (L) 12/22/2021   HCT 31.8 (L) 12/22/2021   MCV 94.4 12/22/2021   PLT 321 12/22/2021   NEUTROABS 3.7 12/22/2021    CMP  Lab Results  Component Value Date   NA 138 12/13/2021   K 3.4 (L) 12/13/2021   CL 102 12/13/2021   CO2 28 12/13/2021   GLUCOSE 123 (H) 12/13/2021   BUN 16 12/13/2021   CREATININE 0.79 12/13/2021   CALCIUM 8.8 (L) 12/13/2021   PROT 6.0 (L) 12/13/2021   ALBUMIN 3.2 (L) 12/13/2021   AST 49 (H) 12/13/2021   ALT 36 12/13/2021   ALKPHOS 78 12/13/2021   BILITOT 0.5 12/13/2021   GFRNONAA >60 12/13/2021    Lab Results  Component Value Date   CAN199 <2 10/14/2021    Medications: I have reviewed the patient's current medications.   Assessment/Plan: Pancreas cancer, stage III (pT2pN2) CT abdomen/pelvis 05/12/2021-dilated intrahepatic and [place] bile ducts, abrupt narrowing of the common bile duct and pancreatic duct at the level of pancreas head, distended  gallbladder MRI/MRCP 05/13/2018 23-3.6 x 2.1 x 4.2 cm enhancing mass in the head of the pancreas with obstruction of the pancreatic duct and common bile duct, no lymphadenopathy, no liver lesions Internal/external biliary drain 05/14/2021 CT chest 05/14/2021-small right pleural effusion, no evidence of metastatic disease Thoracentesis 05/16/2021-negative cytology Pancreaticoduodenectomy at [facility] 06/22/2021-no evidence of metastatic disease, inflamed gallbladder with percutaneous biliary drain, large pancreas head mass-gallbladder with acute cholecystitis and abscess formation negative for malignancy.  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, negative resection margins, 6/36 lymph nodes with metastatic adenocarcinoma, lymphovascular and perineural invasion CT angio chest and CT abdomen/pelvis 08/04/2021-negative for pulmonary embolism, status post Whipple procedure, 11 mm soft tissue nodule along the jejunal mesentery-nonspecific Cycle 1 gemcitabine/Abraxane 08/19/2021 Cycle 2 gemcitabine/Abraxane 09/02/2021 Cycle 3 gemcitabine/Abraxane 09/16/2021 Cycle 4 gemcitabine/Abraxane 09/30/2021 Cycle 5 gemcitabine/Abraxane 10/14/2021 Treatment held 10/28/2021 due to neutropenia, failure to thrive/poor performance status CTs 11/02/2021-stable abdominal retroperitoneal soft tissue thickening and central mesenteric lymph node; a few new tiny upper lobe pulmonary nodules; small to moderate pericardial effusion, increased; severe hepatic steatosis. Mild elevation of the CEA at [facility] 12/16/2021 Postoperative wound dehiscence-status post fascial closure and placement of a wound VAC 07/02/2021 Chronic numbness of the right lower leg and foot Hypertension Left leg edema-negative bilateral venous Doppler 08/19/2021 Diarrhea-likely secondary to pancreas insufficiency, Creon started 09/15/2021 Pericardial effusion on CT 11/02/2021; 2D echo 11/05/2021-small to moderate  pericardial effusion surrounds heart, most  prominent along lateral surface of RV.  Does not appear to be hemodynamically significant by echo criteria.  Repeat 2D echo 11/26/2021-trivial pericardial effusion present.  The pericardial effusion is anterior to the right ventricle.       Disposition: Tonya Jenkins has a history of pancreas cancer.  She underwent a pancreaticoduodenectomy in April 2023.  She began adjuvant gemcitabine/Abraxane in June.  She has been maintained off of adjuvant chemotherapy for the past 2 months due to her poor performance status.  She continues to stay in bed most of the time.  She has persistent diarrhea.  I encouraged her to follow the recommendations of the nutritionist.  I encouraged her to take Creon consistently.  Adjuvant chemotherapy will remain on hold.  She will return for an office visit in 3-4 weeks.  We will check a CEA at the next office visit.  Betsy Coder, MD  12/22/2021  11:24 AM

## 2021-12-22 NOTE — Progress Notes (Signed)
Patient seen by Dr. Benay Spice today  Vitals are within treatment parameters. No intervention needed for BP 160/80  Labs reviewed by Dr. Benay Spice and are within treatment parameters.  Per physician team, patient will not be receiving treatment today.

## 2021-12-23 ENCOUNTER — Other Ambulatory Visit: Payer: Self-pay

## 2021-12-24 ENCOUNTER — Emergency Department (HOSPITAL_BASED_OUTPATIENT_CLINIC_OR_DEPARTMENT_OTHER): Payer: Medicare Other

## 2021-12-24 ENCOUNTER — Telehealth: Payer: Self-pay | Admitting: Nurse Practitioner

## 2021-12-24 ENCOUNTER — Emergency Department (HOSPITAL_BASED_OUTPATIENT_CLINIC_OR_DEPARTMENT_OTHER): Payer: Medicare Other | Admitting: Radiology

## 2021-12-24 ENCOUNTER — Encounter (HOSPITAL_BASED_OUTPATIENT_CLINIC_OR_DEPARTMENT_OTHER): Payer: Self-pay | Admitting: Emergency Medicine

## 2021-12-24 ENCOUNTER — Telehealth: Payer: Self-pay

## 2021-12-24 ENCOUNTER — Emergency Department (HOSPITAL_BASED_OUTPATIENT_CLINIC_OR_DEPARTMENT_OTHER)
Admission: EM | Admit: 2021-12-24 | Discharge: 2021-12-24 | Disposition: A | Discharge status: Home / Self Care | Payer: Medicare Other | Attending: Emergency Medicine | Admitting: Emergency Medicine

## 2021-12-24 ENCOUNTER — Other Ambulatory Visit: Payer: Self-pay

## 2021-12-24 DIAGNOSIS — R911 Solitary pulmonary nodule: Secondary | ICD-10-CM | POA: Diagnosis not present

## 2021-12-24 DIAGNOSIS — Z8507 Personal history of malignant neoplasm of pancreas: Secondary | ICD-10-CM | POA: Insufficient documentation

## 2021-12-24 DIAGNOSIS — R079 Chest pain, unspecified: Secondary | ICD-10-CM

## 2021-12-24 DIAGNOSIS — J90 Pleural effusion, not elsewhere classified: Secondary | ICD-10-CM | POA: Diagnosis not present

## 2021-12-24 LAB — BASIC METABOLIC PANEL
Anion gap: 9 (ref 5–15)
BUN: 15 mg/dL (ref 8–23)
CO2: 25 mmol/L (ref 22–32)
Calcium: 8.9 mg/dL (ref 8.9–10.3)
Chloride: 100 mmol/L (ref 98–111)
Creatinine, Ser: 0.83 mg/dL (ref 0.44–1.00)
GFR, Estimated: >60 mL/min (ref >60)
Glucose, Bld: 116 mg/dL — ABNORMAL HIGH (ref 70–99)
Potassium: 3.9 mmol/L (ref 3.5–5.1)
Sodium: 134 mmol/L — ABNORMAL LOW (ref 135–145)

## 2021-12-24 LAB — CBC
HCT: 33.3 % — ABNORMAL LOW (ref 36.0–46.0)
Hemoglobin: 11.2 g/dL — ABNORMAL LOW (ref 12.0–15.0)
MCH: 31.3 pg (ref 26.0–34.0)
MCHC: 33.6 g/dL (ref 30.0–36.0)
MCV: 93 fL (ref 80.0–100.0)
Platelets: 342 10*3/uL (ref 150–400)
RBC: 3.58 MIL/uL — ABNORMAL LOW (ref 3.87–5.11)
RDW: 12.7 % (ref 11.5–15.5)
WBC: 7.3 10*3/uL (ref 4.0–10.5)
nRBC: 0 % (ref 0.0–0.2)

## 2021-12-24 LAB — TROPONIN I (HIGH SENSITIVITY)
Troponin I (High Sensitivity): 10 ng/L (ref <18)
Troponin I (High Sensitivity): 8 ng/L (ref <18)

## 2021-12-24 MED ORDER — HEPARIN SOD (PORK) LOCK FLUSH 100 UNIT/ML IV SOLN
500.0000 [IU] | Freq: Once | INTRAVENOUS | Status: AC
  Administered 2021-12-24: 500 [IU]
  Filled 2021-12-24: qty 5

## 2021-12-24 MED ORDER — IOHEXOL 350 MG/ML SOLN
100.0000 mL | Freq: Once | INTRAVENOUS | Status: AC | PRN
  Administered 2021-12-24: 60 mL via INTRAVENOUS

## 2021-12-24 NOTE — ED Triage Notes (Signed)
Patient c/o pain in right side chest. Worse with movement. Sharp stabbing pain resolves quickly Denies sob. Started yesterday afternoon Appears comfortable in triage

## 2021-12-24 NOTE — Discharge Instructions (Signed)
Follow-up closely with your oncologist and primary doctor. Take your medications as usual. For severe pain take norco or vicodin however realize they have the potential for addiction and it can make you sleepy and has tylenol in it.  No operating machinery while taking.

## 2021-12-24 NOTE — Telephone Encounter (Signed)
Tonya Jenkins called reported she is having pain under her right breast. The pain started yesterday afternoon. The pain is intermittent  when  she cough, take a deep breath, and when she's turning. I ask the patient is she currently in pain. Patient stated no pain,  shortness of breath or fever. She is having bowel movement about three time weekly.I advise go to the ER if the pain gets worst. Lattie Haw is aware and will give the patient a called.

## 2021-12-24 NOTE — ED Provider Notes (Signed)
[facility] EMERGENCY DEPT Provider Note   CSN: [id number] Arrival date & time: 12/24/21  1715     History  Chief Complaint  Patient presents with   Chest Pain    Tonya Jenkins is a 76 y.o. female.  Patient presents with right-sided chest pain and central chest pain worse with deep breath or movement.  No history of similar.  No history of blood clots however patient is active cancer patient currently not on chemo as she was losing too much weight but followed closely by [facility] and local oncologist.  Pancreatic cancer history.  No known spread of her cancer per her report.  Patient denies any significant shortness of breath.  No known cardiac problems.       Home Medications Prior to Admission medications   Medication Sig Start Date End Date Taking? Authorizing Provider  B Complex Vitamins (VITAMIN B COMPLEX PO) Take 1 capsule by mouth daily.    [provider]  diltiazem (DILACOR XR) 180 MG 24 hr capsule Take 180 mg by mouth at bedtime. 04/27/21   [provider]  hydrOXYzine (ATARAX) 25 MG tablet Take 1 tablet (25 mg total) by mouth at bedtime as needed (for sleep). 11/11/21   [name], MD  lidocaine-prilocaine (EMLA) cream Apply 1 Application topically as directed. Apply 1/2 tablespoon to port site and cover with Press-and-Seal 2 hours prior to stick to numb site 12/13/21   [name], MD  lipase/protease/amylase (CREON) 36000 UNITS CPEP capsule Take 1 capsule (36,000 Units total) by mouth 3 (three) times daily with meals. May also take 1 capsule (36,000 Units total) as needed (with snacks). 09/02/21   [name], MD  magnesium oxide (MAG-OX) 400 (240 Mg) MG tablet Take 1 tablet (400 mg total) by mouth 2 (two) times daily. 09/17/21   [name], MD  Melatonin 1 MG/4ML LIQD Take 1 mg by mouth once. Take at night for sleep    [provider]  NON FORMULARY CPAP at bedtime Patient not taking: Reported on 12/22/2021     [provider]  Ondansetron HCl (ZOFRAN PO) Take 8 mg by mouth every 8 (eight) hours as needed.    [provider]  potassium chloride (MICRO-K) 10 MEQ CR capsule Take 1 capsule (10 mEq total) by mouth 2 (two) times daily. 12/20/21   [name], MD  prochlorperazine (COMPAZINE) 10 MG tablet Take 1 tablet (10 mg total) by mouth every 6 (six) hours as needed for nausea or vomiting. 08/19/21   Owens Shark, NP  Vitamin D, Ergocalciferol, (DRISDOL) 1.25 MG (50000 UNIT) CAPS capsule Take 50,000 Units by mouth once a week. 05/28/21   [provider]      Allergies    Amlodipine besylate, Hydrochlorothiazide, Lexapro [escitalopram oxalate], Oxycodone, and Penicillins    Review of Systems   Review of Systems  Constitutional:  Negative for chills and fever.  HENT:  Negative for congestion.   Eyes:  Negative for visual disturbance.  Respiratory:  Positive for shortness of breath. Negative for cough.   Cardiovascular:  Positive for chest pain.  Gastrointestinal:  Negative for abdominal pain and vomiting.  Genitourinary:  Negative for dysuria and flank pain.  Musculoskeletal:  Negative for back pain, neck pain and neck stiffness.  Skin:  Negative for rash.  Neurological:  Negative for light-headedness and headaches.    Physical Exam Updated Vital Signs BP (!) 147/104   Pulse 76   Temp 99.1 F (37.3 C) (  Oral)   Resp 15   Ht 5' (1.524 m)   Wt 45.4 kg   SpO2 95%   BMI 19.53 kg/m  Physical Exam Vitals and nursing note reviewed.  Constitutional:      General: She is not in acute distress.    Appearance: She is well-developed.  HENT:     Head: Normocephalic and atraumatic.     Mouth/Throat:     Mouth: Mucous membranes are moist.  Eyes:     General:        Right eye: No discharge.        Left eye: No discharge.     Conjunctiva/sclera: Conjunctivae normal.  Neck:     Trachea: No tracheal deviation.  Cardiovascular:     Rate and Rhythm: Normal rate  and regular rhythm.     Heart sounds: No murmur heard. Pulmonary:     Effort: Pulmonary effort is normal.     Breath sounds: Normal breath sounds.  Abdominal:     General: There is no distension.     Palpations: Abdomen is soft.     Tenderness: There is no abdominal tenderness. There is no guarding.  Musculoskeletal:     Cervical back: Normal range of motion and neck supple. No rigidity.     Right lower leg: No edema.     Left lower leg: No edema.  Skin:    General: Skin is warm.     Capillary Refill: Capillary refill takes less than 2 seconds.     Findings: No rash.  Neurological:     General: No focal deficit present.     Mental Status: She is alert.     Cranial Nerves: No cranial nerve deficit.  Psychiatric:        Mood and Affect: Mood normal.     ED Results / Procedures / Treatments   Labs (all labs ordered are listed, but only abnormal results are displayed) Labs Reviewed  BASIC METABOLIC PANEL - Abnormal; Notable for the following components:      Result Value   Sodium 134 (*)    Glucose, Bld 116 (*)    All other components within normal limits  CBC - Abnormal; Notable for the following components:   RBC 3.58 (*)    Hemoglobin 11.2 (*)    HCT 33.3 (*)    All other components within normal limits  TROPONIN I (HIGH SENSITIVITY)  TROPONIN I (HIGH SENSITIVITY)    EKG None  Radiology CT Angio Chest PE W and/or Wo Contrast  Result Date: 12/24/2021 CLINICAL DATA:  Hervey Ard right-sided chest pain history of pancreas cancer EXAM: CT ANGIOGRAPHY CHEST WITH CONTRAST TECHNIQUE: Multidetector CT imaging of the chest was performed using the standard protocol during bolus administration of intravenous contrast. Multiplanar CT image reconstructions and MIPs were obtained to evaluate the vascular anatomy. RADIATION DOSE REDUCTION: This exam was performed according to the departmental dose-optimization program which includes automated exposure control, adjustment of the mA and/or  kV according to patient size and/or use of iterative reconstruction technique. CONTRAST:  77m OMNIPAQUE IOHEXOL 350 MG/ML SOLN COMPARISON:  CT 11/02/2021, 08/04/2021 FINDINGS: Cardiovascular: Satisfactory opacification of the pulmonary arteries to the segmental level. No evidence of pulmonary embolism. Mild aortic atherosclerosis. No aneurysm. Right-sided central venous port tip at the cavoatrial region. Normal cardiac size. Small circumferential pericardial effusion. Mediastinum/Nodes: Midline trachea. No thyroid mass. No suspicious lymph nodes. Esophagus within normal limits. Lungs/Pleura: Small right-sided pleural effusion. No acute airspace disease. Interim finding of several small  pulmonary nodules. 5 mm left lower lobe pulmonary nodule, series 6, image 50. 3 mm right middle lobe pulmonary nodule, series 6, image 65. 3 mm left lower lobe pulmonary nodule, series 6, image 72. Calcified granuloma at the anterior right base. Upper Abdomen: Hepatic steatosis. Musculoskeletal: No acute osseous abnormality. Review of the MIP images confirms the above findings. IMPRESSION: 1. Negative for acute pulmonary embolus. 2. Small right-sided pleural effusion. 3. Interim finding of several ill-defined bilateral pulmonary nodules as above. These are indeterminate for infectious, inflammatory, or metastatic nodules, continued imaging follow-up is recommended 4. Hepatic steatosis Aortic Atherosclerosis (ICD10-I70.0). Electronically Signed   By: Donavan Foil M.D.   On: 12/24/2021 23:09   DG Chest 2 View  Result Date: 12/24/2021 CLINICAL DATA:  Chest pain in the RIGHT chest, worse with movement. EXAM: CHEST - 2 VIEW COMPARISON:  November 02, 2021. FINDINGS: RIGHT-sided Port-A-Cath in situ terminating in the mid to lower SVC. Trachea midline. Cardiomediastinal contours and hilar structures are normal. No signs of lobar consolidation or evidence of pleural effusion. Small nodular density in the RIGHT upper lobe measuring 4 mm  not seen on previous imaging. No pneumothorax. On limited assessment there is no acute skeletal finding. No pleural effusion. IMPRESSION: 1. No acute cardiopulmonary disease. 2. Small nodular density in the RIGHT upper lobe measuring 4 mm not seen on previous imaging. Attention on follow-up CT imaging is suggested in this patient with history of neoplasm. This could be performed on a nonemergent basis. Tiny pulmonary nodules were seen in the upper lobes on previous chest CT imaging but not seen on prior chest radiographs. 3. RIGHT-sided Port-A-Cath in situ. Electronically Signed   By: Zetta Bills M.D.   On: 12/24/2021 18:39    Procedures Procedures    Medications Ordered in ED Medications  iohexol (OMNIPAQUE) 350 MG/ML injection 100 mL (60 mLs Intravenous Contrast Given 12/24/21 2246)  heparin lock flush 100 unit/mL (500 Units Intracatheter Given 12/24/21 2340)    ED Course/ Medical Decision Making/ A&P                           Medical Decision Making Amount and/or Complexity of Data Reviewed Labs: ordered. Radiology: ordered.  Risk Prescription drug management.   Patient with known cancer actively being followed by oncology team presents with pleuritic chest pain which is new for her.  Discussed differential including pulm embolism, musculoskeletal, pleural effusion, pleuritis, atypical ACS, other.  Patient does not request pain meds at this time.  Vital signs reviewed overall unremarkable afebrile, normal oxygenation, normal work of breathing.  Chest x-ray showed small pulm nodule needs follow-up.  With risk factors CT angiogram ordered and results reviewed independently no acute pulm embolism however patient does have small pleural effusion which is likely contributing to her symptoms.  Blood work overall reassuring reviewed independently sodium 134 consistent with her decreased appetite and weight loss, mild baseline anemia.  Patient stable for close outpatient follow-up with  oncology and primary doctor.  Troponins negative no signs of ACS.  EKG no acute ST elevation.  Patient sitting up tolerating a meal prior to discharge.        Final Clinical Impression(s) / ED Diagnoses Final diagnoses:  Nodule of right lung  Acute chest pain  Pleural effusion on right  History of pancreatic cancer    Rx / DC Orders ED Discharge Orders     None         Elnora Morrison,  MD 12/24/21 2354

## 2021-12-24 NOTE — ED Notes (Signed)
Reviewed AVS/discharge instruction with patient. Time allotted for and all questions answered. Patient is agreeable for d/c and escorted to ed exit by staff.  

## 2021-12-24 NOTE — Telephone Encounter (Signed)
I contacted [name] to follow-up on her earlier call.  She reports sudden onset of pain under her "ribs" below the right breast yesterday afternoon.  The pain worsens with coughing, sneezing, certain movements.  She does not feel short of breath.  No significant cough.  No fever.  She denies leg swelling and calf pain.  No known injury or unusual activities.  We discussed various potential etiologies.  I recommended she go to the emergency room for evaluation to rule out a pulmonary embolus.  She plans to proceed to the [facility] ER.

## 2021-12-29 ENCOUNTER — Other Ambulatory Visit: Payer: Self-pay

## 2022-01-11 ENCOUNTER — Other Ambulatory Visit: Payer: Self-pay | Admitting: Oncology

## 2022-01-11 ENCOUNTER — Other Ambulatory Visit (HOSPITAL_BASED_OUTPATIENT_CLINIC_OR_DEPARTMENT_OTHER): Payer: Self-pay

## 2022-01-11 DIAGNOSIS — C250 Malignant neoplasm of head of pancreas: Secondary | ICD-10-CM

## 2022-01-11 MED ORDER — PANCRELIPASE (LIP-PROT-AMYL) 36000-114000 UNITS PO CPEP
ORAL_CAPSULE | ORAL | 1 refills | Status: AC
  Filled 2022-01-11: qty 150, 30d supply, fill #0

## 2022-01-12 ENCOUNTER — Inpatient Hospital Stay: Payer: Medicare Other

## 2022-01-12 ENCOUNTER — Encounter: Payer: Self-pay | Admitting: Nurse Practitioner

## 2022-01-12 ENCOUNTER — Inpatient Hospital Stay (HOSPITAL_BASED_OUTPATIENT_CLINIC_OR_DEPARTMENT_OTHER): Payer: Medicare Other | Admitting: Nurse Practitioner

## 2022-01-12 ENCOUNTER — Inpatient Hospital Stay: Payer: Medicare Other | Attending: Physician Assistant

## 2022-01-12 VITALS — BP 151/74 | HR 87 | Temp 97.9°F | Resp 18 | Ht 60.0 in | Wt 102.0 lb

## 2022-01-12 DIAGNOSIS — C250 Malignant neoplasm of head of pancreas: Secondary | ICD-10-CM

## 2022-01-12 DIAGNOSIS — Z95828 Presence of other vascular implants and grafts: Secondary | ICD-10-CM

## 2022-01-12 DIAGNOSIS — C779 Secondary and unspecified malignant neoplasm of lymph node, unspecified: Secondary | ICD-10-CM | POA: Diagnosis not present

## 2022-01-12 LAB — CBC WITH DIFFERENTIAL (CANCER CENTER ONLY)
Abs Immature Granulocytes: 0.02 10*3/uL (ref 0.00–0.07)
Basophils Absolute: 0.1 10*3/uL (ref 0.0–0.1)
Basophils Relative: 1 %
Eosinophils Absolute: 0.6 10*3/uL — ABNORMAL HIGH (ref 0.0–0.5)
Eosinophils Relative: 7 %
HCT: 35.5 % — ABNORMAL LOW (ref 36.0–46.0)
Hemoglobin: 11.8 g/dL — ABNORMAL LOW (ref 12.0–15.0)
Immature Granulocytes: 0 %
Lymphocytes Relative: 19 %
Lymphs Abs: 1.7 10*3/uL (ref 0.7–4.0)
MCH: 29.6 pg (ref 26.0–34.0)
MCHC: 33.2 g/dL (ref 30.0–36.0)
MCV: 89.2 fL (ref 80.0–100.0)
Monocytes Absolute: 0.6 10*3/uL (ref 0.1–1.0)
Monocytes Relative: 7 %
Neutro Abs: 5.7 10*3/uL (ref 1.7–7.7)
Neutrophils Relative %: 66 %
Platelet Count: 336 10*3/uL (ref 150–400)
RBC: 3.98 MIL/uL (ref 3.87–5.11)
RDW: 12.3 % (ref 11.5–15.5)
WBC Count: 8.7 10*3/uL (ref 4.0–10.5)
nRBC: 0 % (ref 0.0–0.2)

## 2022-01-12 LAB — CMP (CANCER CENTER ONLY)
ALT: 30 U/L (ref 0–44)
AST: 34 U/L (ref 15–41)
Albumin: 3.7 g/dL (ref 3.5–5.0)
Alkaline Phosphatase: 85 U/L (ref 38–126)
Anion gap: 9 (ref 5–15)
BUN: 23 mg/dL (ref 8–23)
CO2: 28 mmol/L (ref 22–32)
Calcium: 9.3 mg/dL (ref 8.9–10.3)
Chloride: 100 mmol/L (ref 98–111)
Creatinine: 0.86 mg/dL (ref 0.44–1.00)
GFR, Estimated: >60 mL/min (ref >60)
Glucose, Bld: 154 mg/dL — ABNORMAL HIGH (ref 70–99)
Potassium: 3.7 mmol/L (ref 3.5–5.1)
Sodium: 137 mmol/L (ref 135–145)
Total Bilirubin: 0.4 mg/dL (ref 0.3–1.2)
Total Protein: 6.8 g/dL (ref 6.5–8.1)

## 2022-01-12 LAB — MAGNESIUM: Magnesium: 1.8 mg/dL (ref 1.7–2.4)

## 2022-01-12 LAB — CEA (ACCESS): CEA (CHCC): 15.16 ng/mL — ABNORMAL HIGH (ref 0.00–5.00)

## 2022-01-12 MED ORDER — HEPARIN SOD (PORK) LOCK FLUSH 100 UNIT/ML IV SOLN
500.0000 [IU] | Freq: Once | INTRAVENOUS | Status: AC
  Administered 2022-01-12: 500 [IU] via INTRAVENOUS

## 2022-01-12 MED ORDER — SODIUM CHLORIDE 0.9% FLUSH
10.0000 mL | INTRAVENOUS | Status: AC | PRN
  Administered 2022-01-12: 10 mL via INTRAVENOUS

## 2022-01-12 NOTE — Patient Instructions (Signed)

## 2022-01-12 NOTE — Progress Notes (Signed)
[facility] OFFICE PROGRESS NOTE   Diagnosis:  Pancreas cancer  INTERVAL HISTORY:   Tonya Jenkins returns as scheduled.  She continues to spend the majority of the day "laying down".  She becomes fatigued with minimal activity.  She has a good appetite but often times does not have the energy to completely make a meal.  She is scheduled to begin physical therapy next week.  She reports persistent/consistent nausea.  Stool is more formed but continues to be frequent.  She estimates taking Creon 4-5 times a day.  No shortness of breath or cough.  She is having trouble sleeping at nighttime and wonders about a prescription sleep medicine.  Objective:  Vital signs in last 24 hours:  Blood pressure (!) 151/74, pulse 87, temperature 97.9 F (36.6 C), temperature source Oral, resp. rate 18, height 5' (1.524 m), weight 102 lb (46.3 kg), SpO2 99 %.    HEENT: No thrush or ulcers. Resp: Lungs clear bilaterally, mildly decreased at the right lung base.  No respiratory distress. Cardio: Regular rate and rhythm. GI: Mild diffuse tenderness.  No apparent ascites.  No hepatosplenomegaly.  No mass. Vascular: No leg edema. Neuro: Alert and oriented. Port-A-Cath without erythema.  Lab Results:  Lab Results  Component Value Date   WBC 8.7 01/12/2022   HGB 11.8 (L) 01/12/2022   HCT 35.5 (L) 01/12/2022   MCV 89.2 01/12/2022   PLT 336 01/12/2022   NEUTROABS 5.7 01/12/2022    Imaging:  No results found.  Medications: I have reviewed the patient's current medications.  Assessment/Plan: Pancreas cancer, stage III (pT2pN2) CT abdomen/pelvis 05/12/2021-dilated intrahepatic and [place] bile ducts, abrupt narrowing of the common bile duct and pancreatic duct at the level of pancreas head, distended gallbladder MRI/MRCP 05/13/2018 23-3.6 x 2.1 x 4.2 cm enhancing mass in the head of the pancreas with obstruction of the pancreatic duct and common bile duct, no lymphadenopathy, no liver  lesions Internal/external biliary drain 05/14/2021 CT chest 05/14/2021-small right pleural effusion, no evidence of metastatic disease Thoracentesis 05/16/2021-negative cytology [facility] at [facility] 06/22/2021-no evidence of metastatic disease, inflamed gallbladder with percutaneous biliary drain, large pancreas head mass-gallbladder with acute cholecystitis and abscess formation negative for malignancy.  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, negative resection margins, 6/36 lymph nodes with metastatic adenocarcinoma, lymphovascular and perineural invasion CT angio chest and CT abdomen/pelvis 08/04/2021-negative for pulmonary embolism, status post Whipple procedure, 11 mm soft tissue nodule along the jejunal mesentery-nonspecific Cycle 1 gemcitabine/Abraxane 08/19/2021 Cycle 2 gemcitabine/Abraxane 09/02/2021 Cycle 3 gemcitabine/Abraxane 09/16/2021 Cycle 4 gemcitabine/Abraxane 09/30/2021 Cycle 5 gemcitabine/Abraxane 10/14/2021 Treatment held 10/28/2021 due to neutropenia, failure to thrive/poor performance status CTs 11/02/2021-stable abdominal retroperitoneal soft tissue thickening and central mesenteric lymph node; a few new tiny upper lobe pulmonary nodules; small to moderate pericardial effusion, increased; severe hepatic steatosis. Mild elevation of the CEA at [facility] 12/16/2021 CT chest 12/24/2021 (done to evaluate right chest pain)-negative for acute pulmonary embolus; small right-sided pleural effusion; several ill-defined bilateral pulmonary nodules, indeterminate.  Hepatic steatosis.  Small circumferential pericardial effusion. Postoperative wound dehiscence-status post fascial closure and placement of a wound VAC 07/02/2021 Chronic numbness of the right lower leg and foot Hypertension Left leg edema-negative bilateral venous Doppler 08/19/2021 Diarrhea-likely secondary to pancreas insufficiency, Creon started 09/15/2021 Pericardial effusion on CT 11/02/2021; 2D echo  11/05/2021-small to moderate pericardial effusion surrounds heart, most prominent along lateral surface of RV.  Does not appear to be hemodynamically significant by echo criteria.  Repeat 2D echo 11/26/2021-trivial pericardial effusion present.  The pericardial effusion is anterior to the right ventricle.     Disposition: [name] appears unchanged.  Performance status remains poor.  She understands she is not a candidate to resume chemotherapy in her current condition.  She is scheduled to begin physical therapy next week.  She will continue to work on nutrition and activity.  The recent CT chest (12/24/2021) showed bilateral lung nodules, described as indeterminate.  We will review the images.  She will return for lab and follow-up in 3 weeks.  She remains hopeful chemotherapy can be resumed.    Ned Card ANP/GNP-BC   01/12/2022  9:40 AM

## 2022-01-12 NOTE — Addendum Note (Signed)
Addended by: Velna Hatchet on: 01/12/2022 10:47 AM   Modules accepted: Orders

## 2022-01-13 ENCOUNTER — Other Ambulatory Visit: Payer: Self-pay

## 2022-01-16 ENCOUNTER — Other Ambulatory Visit: Payer: Self-pay | Admitting: Oncology

## 2022-01-17 ENCOUNTER — Other Ambulatory Visit (HOSPITAL_BASED_OUTPATIENT_CLINIC_OR_DEPARTMENT_OTHER): Payer: Self-pay

## 2022-01-19 ENCOUNTER — Ambulatory Visit (HOSPITAL_BASED_OUTPATIENT_CLINIC_OR_DEPARTMENT_OTHER): Payer: Medicare Other | Attending: Oncology | Admitting: Physical Therapy

## 2022-01-19 ENCOUNTER — Encounter (HOSPITAL_BASED_OUTPATIENT_CLINIC_OR_DEPARTMENT_OTHER): Payer: Self-pay | Admitting: Physical Therapy

## 2022-01-19 DIAGNOSIS — M6281 Muscle weakness (generalized): Secondary | ICD-10-CM | POA: Diagnosis present

## 2022-01-19 DIAGNOSIS — R2689 Other abnormalities of gait and mobility: Secondary | ICD-10-CM | POA: Diagnosis present

## 2022-01-19 DIAGNOSIS — C250 Malignant neoplasm of head of pancreas: Secondary | ICD-10-CM | POA: Insufficient documentation

## 2022-01-19 NOTE — Therapy (Signed)
OUTPATIENT PHYSICAL THERAPY LOWER EXTREMITY EVALUATION   Patient Name: Tonya Jenkins MRN: [id number] DOB:1945/10/15, 76 y.o., female Today's Date: 01/19/2022   PT End of Session - 01/19/22 1059     Visit Number 1    Number of Visits 12    Date for PT Re-Evaluation 03/16/22    Authorization Type Mcr    PT Start Time 0945    PT Stop Time 1030    PT Time Calculation (min) 45 min    Activity Tolerance Patient tolerated treatment well;Patient limited by fatigue    Behavior During Therapy [facility] for tasks assessed/performed             Past Medical History:  Diagnosis Date   History of hip surgery    Hx of neck surgery    Hypertension    Past Surgical History:  Procedure Laterality Date   CERVICAL SPINE SURGERY     ERCP N/A 05/13/2021   Procedure: ENDOSCOPIC RETROGRADE CHOLANGIOPANCREATOGRAPHY (ERCP);  Surgeon: Carol Ada, MD;  Location: [place];  Service: Gastroenterology;  Laterality: N/A;   ESOPHAGOGASTRODUODENOSCOPY (EGD) WITH PROPOFOL N/A 05/14/2021   Procedure: ESOPHAGOGASTRODUODENOSCOPY (EGD) WITH PROPOFOL;  Surgeon: Carol Ada, MD;  Location: [place];  Service: Gastroenterology;  Laterality: N/A;   EUS Left 05/14/2021   Procedure: UPPER ENDOSCOPIC ULTRASOUND (EUS) LINEAR;  Surgeon: Carol Ada, MD;  Location: [place];  Service: Gastroenterology;  Laterality: Left;   FINE NEEDLE ASPIRATION  05/14/2021   Procedure: FINE NEEDLE ASPIRATION (FNA) LINEAR;  Surgeon: Carol Ada, MD;  Location: [place];  Service: Gastroenterology;;   HAMMER TOE SURGERY     HIP SURGERY     IR IMAGING GUIDED PORT INSERTION  08/16/2021   IR INT EXT BILIARY DRAIN WITH CHOLANGIOGRAM  05/13/2021   IR PATIENT EVAL TECH 0-60 MINS  05/20/2021   KNEE SURGERY     PANCREATICODUODENECTOMY  06/22/2021   stated per pt.   Patient Active Problem List   Diagnosis Date Noted   Genetic testing 05/31/2021   Pancreatic cancer ([place]) 05/20/2021   Obstructive jaundice secondary to  pancreatic mass 05/12/2021   Normocytic anemia 05/12/2021   Essential hypertension 05/12/2021   Pancreatic mass 05/12/2021   Hypokalemia 05/12/2021   Possible AKI (acute kidney injury) ([place]) 05/12/2021   Abnormal urinalysis 05/12/2021   Hyperbilirubinemia 05/12/2021   Elevated liver enzymes 05/12/2021   Hyperglycemia 05/12/2021   OSA on CPAP 05/12/2021   Snoring 01/01/2021   Closed fracture of metatarsal bone 12/10/2018   Right foot pain 02/05/2018   Hallux valgus of right foot 11/09/2017   Osteoarthritis of knee 10/14/2015    PCP: [name] [[id number]]   REFERRING PROVIDER: [name], MD   REFERRING DIAG:  Diagnosis  C25.0 (ICD-10-CM) - Malignant neoplasm of head of pancreas ([place])    THERAPY DIAG:  Muscle weakness (generalized)  Balance problem  Rationale for Evaluation and Treatment: Rehabilitation  ONSET DATE: March 2023  SUBJECTIVE:   SUBJECTIVE STATEMENT: I'm just tired. Lost ~50lb. Had whipple surgery in April. Use a rollator in home. Can tolerate sitting for only about 30  minutes, standing maybe 5. They stopped my chemotherapy because I was not tolerating it Lives with friend [name].  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 No  PRECAUTIONS: Fall  WEIGHT BEARING RESTRICTIONS:  No  FALLS:  Has patient fallen in last 6 months? Yes. Number of falls 1 getting onto bed  LIVING ENVIRONMENT: Lives with:  Pt has neighbor.friend staying with her at this time otherwise she lives alone Lives in: House/apartment Stairs:  to enter home x 4 steps Has following equipment at home: Single point cane, Environmental consultant - 4 wheeled, Wheelchair (manual), Shower bench, bed side commode, and Grab bars  OCCUPATION:  retired  PLOF: Independent with household mobility with device  PATIENT GOALS: gain more energy and strength, be able to drive car  NEXT MD VISIT:   OBJECTIVE:   DIAGNOSTIC FINDINGS: n/a  PATIENT SURVEYS:  FOTO 49 with goal of 56%  COGNITION: Overall cognitive status: Within functional limits for tasks assessed and Chemo brain      SENSATION: Chronic numbness of the right lower leg and foot   EDEMA NONE although does have a hx of    POSTURE: No Significant postural limitations  PALPATION: wfl  LOWER EXTREMITY ROM: WFL LOWER EXTREMITY MMT:  MMT Right eval Left eval  Hip flexion 3 3  Hip extension    Hip abduction 3 3  Hip adduction 3 3  Hip internal rotation    Hip external rotation    Knee flexion 4- 4-  Knee extension 4 4  Ankle dorsiflexion 4 4  Ankle plantarflexion 4 4  Ankle inversion    Ankle eversion     (Blank rows = not tested)  FUNCTIONAL TESTS:  5 times sit to stand: pulse 114 : score:28.81 completed from transport chair with ue use Timed up and go (TUG): 26.63 using fww.  GAIT: Distance walked: 30 ft Assistive device utilized: Walker - 2 wheeled Level of assistance: CGA Comments: poor balance   TODAY'S TREATMENT:                                                                                                                              DATE: 01/19/22  Assessment Objective testing Functional tests   PATIENT EDUCATION:  Education details: Discussed eval findings, rehab rationale and POC and patient is in agreement Person educated: Patient and Designer, fashion/clothing Education method: Explanation Education comprehension: verbalized understanding  HOME EXERCISE PROGRAM: Increase sitting times daily up towards 2 hours in 30 minute increments  ASSESSMENT:  CLINICAL IMPRESSION: Patient is a 76 y.o. f who was seen today for physical therapy evaluation and treatment for general debilitation after Whipple procedure and chemotherapy. She is  accompanied by neighbor/friend who is now residing with her to care give. Pt presents in a very weakened state in [place]. Reports she doesn't really tolerate sitting very well >30 minutes when asked and tolerates standing (with AD) for <5 minutes. She does amb through home using a rollator. Pt states she is comfortable in a pool and is hoping to regain some strength. She is a good candidate for aquatics although we will trial toleration.  I do not believe she will tolerate  a 45 min session. May need to stop at 30 mins if not before with allowance for multiple rest periods. We will start with 1 x week working towards 2 when/if able.  OBJECTIVE IMPAIRMENTS: decreased activity tolerance, decreased balance, decreased endurance, decreased mobility, decreased strength, and impaired sensation.   ACTIVITY LIMITATIONS: carrying, lifting, bending, sitting, standing, squatting, stairs, transfers, and bed mobility  PARTICIPATION LIMITATIONS: meal prep, cleaning, laundry, driving, shopping, community activity, yard work, and church  PERSONAL FACTORS: Age, Past/current experiences, and 1-2 comorbidities: pancreatic cancer, S/p whipple procedure  are also affecting patient's functional outcome.   REHAB POTENTIAL: Fair Uncertain pt will tolerate  CLINICAL DECISION MAKING: Evolving/moderate complexity  EVALUATION COMPLEXITY: Moderate   GOALS: Goals reviewed with patient? Yes  SHORT TERM GOALS: Target date: 02/16/2022  Pt will tolerate aquatic sessions 1 x weekly and will consider increasing to 2x week to improve strength and function Baseline:unable to tolerate Goal status: INITIAL  2. Pt will be able to walk submerged with ue support as needed x 10 widths/122f (total with recovery periods as needed)  Baseline: ~30 ft land based (reported)  Goal Status: INITIAL  LONG TERM GOALS: Target date: 03/16/2022   Foto goal of 56% to be met to demonstrate improved functional ability Baseline: 49% Goal status:  INITIAL  2.  Pt will improve on 5x STS test to <20s to demonstrate decreased fall risk/improvement in LE strength and balance Baseline: 28.81 Goal status: INITIAL  3.  Pt will improve on TUG test to <18s to demonstrate improved mobility Baseline:  26.63 Goal status: INITIAL  4.  Pt will tolerate a full aquatic session x 2 weekly Baseline: unable to tolerate Goal status: INITIAL   PLAN:  PT FREQUENCY: 1-2x/week Will start with 1 x week for first 4 weeks to determine toleration  PT DURATION: 8 weeks  PLANNED INTERVENTIONS: Therapeutic exercises, Therapeutic activity, Neuromuscular re-education, Balance training, Gait training, Patient/Family education, Self Care, Joint mobilization, Joint manipulation, Stair training, Aquatic Therapy, Dry Needling, Spinal manipulation, Spinal mobilization, Cryotherapy, Moist heat, Splintting, Taping, Traction, Ultrasound, Ionotophoresis 476mml Dexamethasone, Manual therapy, and Re-evaluation  PLAN FOR NEXT SESSION: Initiate aquatics: maybe only able to tolerate 30 mins initially. Work on LEM[employer]Donald's Corporationtoleration to activity, amb and balance to tolerance   FrDenton MeekPT MPT 01/19/2022, 4:12 PM

## 2022-01-20 ENCOUNTER — Other Ambulatory Visit: Payer: Self-pay

## 2022-01-25 NOTE — Therapy (Incomplete)
OUTPATIENT PHYSICAL THERAPY LOWER EXTREMITY EVALUATION   Patient Name: Tonya Jenkins MRN: [id number] DOB:October 03, 1945, 76 y.o., female Today's Date: 01/25/2022     Past Medical History:  Diagnosis Date   History of hip surgery    Hx of neck surgery    Hypertension    Past Surgical History:  Procedure Laterality Date   CERVICAL SPINE SURGERY     ERCP N/A 05/13/2021   Procedure: ENDOSCOPIC RETROGRADE CHOLANGIOPANCREATOGRAPHY (ERCP);  Surgeon: Carol Ada, MD;  Location: [place];  Service: Gastroenterology;  Laterality: N/A;   ESOPHAGOGASTRODUODENOSCOPY (EGD) WITH PROPOFOL N/A 05/14/2021   Procedure: ESOPHAGOGASTRODUODENOSCOPY (EGD) WITH PROPOFOL;  Surgeon: Carol Ada, MD;  Location: [place];  Service: Gastroenterology;  Laterality: N/A;   EUS Left 05/14/2021   Procedure: UPPER ENDOSCOPIC ULTRASOUND (EUS) LINEAR;  Surgeon: Carol Ada, MD;  Location: [place];  Service: Gastroenterology;  Laterality: Left;   FINE NEEDLE ASPIRATION  05/14/2021   Procedure: FINE NEEDLE ASPIRATION (FNA) LINEAR;  Surgeon: Carol Ada, MD;  Location: [place];  Service: Gastroenterology;;   HAMMER TOE SURGERY     HIP SURGERY     IR IMAGING GUIDED PORT INSERTION  08/16/2021   IR INT EXT BILIARY DRAIN WITH CHOLANGIOGRAM  05/13/2021   IR PATIENT EVAL TECH 0-60 MINS  05/20/2021   KNEE SURGERY     PANCREATICODUODENECTOMY  06/22/2021   stated per pt.   Patient Active Problem List   Diagnosis Date Noted   Genetic testing 05/31/2021   Pancreatic cancer ([place]) 05/20/2021   Obstructive jaundice secondary to pancreatic mass 05/12/2021   Normocytic anemia 05/12/2021   Essential hypertension 05/12/2021   Pancreatic mass 05/12/2021   Hypokalemia 05/12/2021   Possible AKI (acute kidney injury) ([place]) 05/12/2021   Abnormal urinalysis 05/12/2021   Hyperbilirubinemia 05/12/2021   Elevated liver enzymes 05/12/2021   Hyperglycemia 05/12/2021   OSA on CPAP 05/12/2021   Snoring 01/01/2021    Closed fracture of metatarsal bone 12/10/2018   Right foot pain 02/05/2018   Hallux valgus of right foot 11/09/2017   Osteoarthritis of knee 10/14/2015    PCP: [name] [[id number]]   REFERRING PROVIDER: [name], MD   REFERRING DIAG:  Diagnosis  C25.0 (ICD-10-CM) - Malignant neoplasm of head of pancreas ([place])    THERAPY DIAG:  No diagnosis found.  Rationale for Evaluation and Treatment: Rehabilitation  ONSET DATE: March 2023  SUBJECTIVE:   SUBJECTIVE STATEMENT: I'm just tired. Lost ~50lb. Had whipple surgery in April. Use a rollator in home. Can tolerate sitting for only about 30  minutes, standing maybe 5. They stopped my chemotherapy because I was not tolerating it Lives with friend Tonya Jenkins.  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 No  PRECAUTIONS: Fall  WEIGHT BEARING RESTRICTIONS: No  FALLS:  Has patient fallen in last 6 months? Yes. Number of falls 1 getting onto bed  LIVING ENVIRONMENT: Lives with:  Pt has neighbor.friend staying with her at this time otherwise she lives alone Lives in: House/apartment Stairs:  to enter home x 4 steps Has following equipment at home: Single point cane, Environmental consultant - 4 wheeled, Wheelchair (manual), Shower bench, bed side commode, and Grab bars  OCCUPATION: retired  PLOF: Independent with household mobility with device  PATIENT GOALS: gain more energy and strength, be able to  drive car  NEXT MD VISIT:   OBJECTIVE:   DIAGNOSTIC FINDINGS: n/a  PATIENT SURVEYS:  FOTO 88 with goal of 56%  COGNITION: Overall cognitive status: Within functional limits for tasks assessed and Chemo brain      SENSATION: Chronic numbness of the right lower leg and foot    EDEMA NONE although does have a hx of    POSTURE: No Significant postural limitations  PALPATION: wfl  LOWER EXTREMITY ROM: WFL LOWER EXTREMITY MMT:  MMT Right eval Left eval  Hip flexion 3 3  Hip extension    Hip abduction 3 3  Hip adduction 3 3  Hip internal rotation    Hip external rotation    Knee flexion 4- 4-  Knee extension 4 4  Ankle dorsiflexion 4 4  Ankle plantarflexion 4 4  Ankle inversion    Ankle eversion     (Blank rows = not tested)  FUNCTIONAL TESTS:  5 times sit to stand: Jenkins 114 : score:28.81 completed from transport chair with ue use Timed up and go (TUG): 26.63 using fww.  GAIT: Distance walked: 30 ft Assistive device utilized: Walker - 2 wheeled Level of assistance: CGA Comments: poor balance   TODAY'S TREATMENT:                                                                                                                              DATE: 01/19/22  Assessment Objective testing Functional tests   PATIENT EDUCATION:  Education details: Discussed eval findings, rehab rationale and POC and patient is in agreement Person educated: Patient and Designer, fashion/clothing Education method: Explanation Education comprehension: verbalized understanding  HOME EXERCISE PROGRAM: Increase sitting times daily up towards 2 hours in 30 minute increments  ASSESSMENT:  CLINICAL IMPRESSION: Patient is a 76 y.o. f who was seen today for physical therapy evaluation and treatment for general debilitation after Whipple procedure and chemotherapy. She is accompanied by neighbor/friend who is now residing with her to care give. Pt presents in a very weakened state in [place]. Reports she doesn't really tolerate sitting very well >30 minutes when asked and tolerates standing (with AD) for <5 minutes. She does amb through home using a rollator. Pt states she is comfortable in a pool and is hoping to regain some strength. She is a good candidate for aquatics although we  will trial toleration.  I do not believe she will tolerate a 45 min session. May need to stop at 30 mins if not before with allowance for multiple rest periods. We will start with 1 x week working towards 2 when/if able.  OBJECTIVE IMPAIRMENTS: decreased activity tolerance, decreased balance, decreased endurance, decreased mobility, decreased strength, and impaired sensation.   ACTIVITY LIMITATIONS: carrying, lifting, bending, sitting, standing, squatting, stairs, transfers, and bed mobility  PARTICIPATION LIMITATIONS: meal prep, cleaning, laundry, driving, shopping, community activity, yard work, and church  PERSONAL FACTORS: Age, Past/current experiences, and 1-2 comorbidities: pancreatic cancer, S/p whipple procedure  are also affecting patient's functional outcome.   REHAB POTENTIAL: Fair Uncertain pt will tolerate  CLINICAL DECISION MAKING: Evolving/moderate complexity  EVALUATION COMPLEXITY: Moderate   GOALS: Goals reviewed with patient? Yes  SHORT TERM GOALS: Target date: 02/16/2022  Pt will tolerate aquatic sessions 1 x weekly and will consider increasing to 2x week to improve strength and function Baseline:unable to tolerate Goal status: INITIAL  2. Pt will be able to walk submerged with ue support as needed x 10 widths/158f (total with recovery periods as needed)  Baseline: ~30 ft land based (reported)  Goal Status: INITIAL  LONG TERM GOALS: Target date: 03/16/2022   Foto goal of 56% to be met to demonstrate improved functional ability Baseline: 49% Goal status: INITIAL  2.  Pt will improve on 5x STS test to <20s to demonstrate decreased fall risk/improvement in LE strength and balance Baseline: 28.81 Goal status: INITIAL  3.  Pt will improve on TUG test to <18s to demonstrate improved mobility Baseline:  26.63 Goal status: INITIAL  4.  Pt will tolerate a full aquatic session x 2 weekly Baseline: unable to tolerate Goal status: INITIAL   PLAN:  PT  FREQUENCY: 1-2x/week Will start with 1 x week for first 4 weeks to determine toleration  PT DURATION: 8 weeks  PLANNED INTERVENTIONS: Therapeutic exercises, Therapeutic activity, Neuromuscular re-education, Balance training, Gait training, Patient/Family education, Self Care, Joint mobilization, Joint manipulation, Stair training, Aquatic Therapy, Dry Needling, Spinal manipulation, Spinal mobilization, Cryotherapy, Moist heat, Splintting, Taping, Traction, Ultrasound, Ionotophoresis 410mml Dexamethasone, Manual therapy, and Re-evaluation  PLAN FOR NEXT SESSION: Initiate aquatics: maybe only able to tolerate 30 mins initially. Work on LEMcDonald[employer] Corporationtoleration to activity, amb and balance to tolerance   FrDenton MeekPT MPT 01/25/2022, 2:19 PM

## 2022-01-26 ENCOUNTER — Other Ambulatory Visit: Payer: Self-pay | Admitting: *Deleted

## 2022-01-26 ENCOUNTER — Ambulatory Visit (HOSPITAL_BASED_OUTPATIENT_CLINIC_OR_DEPARTMENT_OTHER): Payer: Medicare Other | Admitting: Physical Therapy

## 2022-01-26 ENCOUNTER — Encounter: Payer: Self-pay | Admitting: *Deleted

## 2022-01-26 MED ORDER — PREDNISONE 10 MG PO TABS: 10.0000 mg | ORAL_TABLET | Freq: Every day | ORAL | 0 refills | Status: DC

## 2022-02-01 ENCOUNTER — Ambulatory Visit (HOSPITAL_BASED_OUTPATIENT_CLINIC_OR_DEPARTMENT_OTHER): Payer: Medicare Other | Admitting: Physical Therapy

## 2022-02-01 ENCOUNTER — Encounter (HOSPITAL_BASED_OUTPATIENT_CLINIC_OR_DEPARTMENT_OTHER): Payer: Self-pay | Admitting: Physical Therapy

## 2022-02-01 DIAGNOSIS — M6281 Muscle weakness (generalized): Secondary | ICD-10-CM | POA: Diagnosis not present

## 2022-02-01 DIAGNOSIS — R2689 Other abnormalities of gait and mobility: Secondary | ICD-10-CM

## 2022-02-01 NOTE — Therapy (Addendum)
OUTPATIENT PHYSICAL THERAPY LOWER EXTREMITY EVALUATION PHYSICAL THERAPY DISCHARGE SUMMARY  Visits from Start of Care: 2  Current functional level related to goals / functional outcomes: No change   Remaining deficits: Balance deficit   Education / Equipment: Discussed eval findings, rehab rationale, aquatic program progression/POC and pools in area. Patient is in agreement    Patient agrees to discharge. Patient goals were not met. Patient is being discharged due to not returning since the last visit.   Patient Name: Tonya Jenkins MRN: [id number] DOB:09-24-1945, 76 y.o., female Today's Date: 02/01/2022   PT End of Session - 02/01/22 1325     Visit Number 2    Number of Visits 12    Date for PT Re-Evaluation 03/16/22    Authorization Type Mcr    PT Start Time 1215    PT Stop Time 1248    PT Time Calculation (min) 33 min    Activity Tolerance Patient tolerated treatment well;Patient limited by fatigue    Behavior During Therapy [facility] for tasks assessed/performed             Past Medical History:  Diagnosis Date   History of hip surgery    Hx of neck surgery    Hypertension    Past Surgical History:  Procedure Laterality Date   CERVICAL SPINE SURGERY     ERCP N/A 05/13/2021   Procedure: ENDOSCOPIC RETROGRADE CHOLANGIOPANCREATOGRAPHY (ERCP);  Surgeon: Jeani Hawking, MD;  Location: [facility] ENDOSCOPY;  Service: Gastroenterology;  Laterality: N/A;   ESOPHAGOGASTRODUODENOSCOPY (EGD) WITH PROPOFOL N/A 05/14/2021   Procedure: ESOPHAGOGASTRODUODENOSCOPY (EGD) WITH PROPOFOL;  Surgeon: Jeani Hawking, MD;  Location: [facility] ENDOSCOPY;  Service: Gastroenterology;  Laterality: N/A;   EUS Left 05/14/2021   Procedure: UPPER ENDOSCOPIC ULTRASOUND (EUS) LINEAR;  Surgeon: Jeani Hawking, MD;  Location: [facility] ENDOSCOPY;  Service: Gastroenterology;  Laterality: Left;   FINE NEEDLE ASPIRATION  05/14/2021   Procedure: FINE NEEDLE ASPIRATION (FNA) LINEAR;  Surgeon: Jeani Hawking, MD;  Location: [facility]  ENDOSCOPY;  Service: Gastroenterology;;   HAMMER TOE SURGERY     HIP SURGERY     IR IMAGING GUIDED PORT INSERTION  08/16/2021   IR INT EXT BILIARY DRAIN WITH CHOLANGIOGRAM  05/13/2021   IR PATIENT EVAL TECH 0-60 MINS  05/20/2021   KNEE SURGERY     PANCREATICODUODENECTOMY  06/22/2021   stated per pt.   Patient Active Problem List   Diagnosis Date Noted   Genetic testing 05/31/2021   Pancreatic cancer (HCC) 05/20/2021   Obstructive jaundice secondary to pancreatic mass 05/12/2021   Normocytic anemia 05/12/2021   Essential hypertension 05/12/2021   Pancreatic mass 05/12/2021   Hypokalemia 05/12/2021   Possible AKI (acute kidney injury) (HCC) 05/12/2021   Abnormal urinalysis 05/12/2021   Hyperbilirubinemia 05/12/2021   Elevated liver enzymes 05/12/2021   Hyperglycemia 05/12/2021   OSA on CPAP 05/12/2021   Snoring 01/01/2021   Closed fracture of metatarsal bone 12/10/2018   Right foot pain 02/05/2018   Hallux valgus of right foot 11/09/2017   Osteoarthritis of knee 10/14/2015    PCP: Aliene Beams [[id number]]   REFERRING PROVIDER: Ladene Artist, MD   REFERRING DIAG:  Diagnosis  C25.0 (ICD-10-CM) - Malignant neoplasm of head of pancreas (HCC)    THERAPY DIAG:  Muscle weakness (generalized)  Balance problem  Rationale for Evaluation and Treatment: Rehabilitation  ONSET DATE: March 2023  SUBJECTIVE:   SUBJECTIVE STATEMENT: "Not feeling great but I am going to try"  I'm just tired. Lost ~50lb. Had whipple surgery in April. Use  a rollator in home. Can tolerate sitting for only about 30  minutes, standing maybe 5. They stopped my chemotherapy because I was not tolerating it Lives with friend Olegario Messier.  PERTINENT HISTORY: Pancreas cancer, stage III  Pancreaticoduodenectomy at [facility] 06/22/2021 (Whipple)-no evidence of metastatic disease,   6/36 lymph nodes with metastatic adenocarcinoma, lymphovascular and perineural invasion  Chronic numbness of the right lower leg and  foot  PAIN:  Are you having pain? No  PRECAUTIONS: Fall  WEIGHT BEARING RESTRICTIONS: No  FALLS:  Has patient fallen in last 6 months? Yes. Number of falls 1 getting onto bed  LIVING ENVIRONMENT: Lives with:  Pt has neighbor.friend staying with her at this time otherwise she lives alone Lives in: House/apartment Stairs:  to enter home x 4 steps Has following equipment at home: Single point cane, Environmental consultant - 4 wheeled, Wheelchair (manual), Shower bench, bed side commode, and Grab bars  OCCUPATION: retired  PLOF: Independent with household mobility with device  PATIENT GOALS: gain more energy and strength, be able to drive car  NEXT MD VISIT:   OBJECTIVE:   DIAGNOSTIC FINDINGS: n/a  PATIENT SURVEYS:  FOTO 49 with goal of 56%  COGNITION: Overall cognitive status: Within functional limits for tasks assessed and Chemo brain      SENSATION: Chronic numbness of the right lower leg and foot   EDEMA NONE although does have a hx of    POSTURE: No Significant postural limitations  PALPATION: wfl  LOWER EXTREMITY ROM: WFL LOWER EXTREMITY MMT:  MMT Right eval Left eval  Hip flexion 3 3  Hip extension    Hip abduction 3 3  Hip adduction 3 3  Hip internal rotation    Hip external rotation    Knee flexion 4- 4-  Knee extension 4 4  Ankle dorsiflexion 4 4  Ankle plantarflexion 4 4  Ankle inversion    Ankle eversion     (Blank rows = not tested)  FUNCTIONAL TESTS:  5 times sit to stand: pulse 114 : score:28.81 completed from transport chair with ue use Timed up and go (TUG): 26.63 using fww.  GAIT: Distance walked: 30 ft Assistive device utilized: Walker - 2 wheeled Level of assistance: CGA Comments: poor balance   TODAY'S TREATMENT:                                                                                                                              DATE: 02/01/22 Pt seen for aquatic therapy today.  Treatment took place in water 3.25-4.5 ft in depth  at the [facility] pool. Temp of water was 91.  Pt entered/exited the pool via lift with cga-sba  Intro to setting Seated on lift chair: cycles of LAQ, cycling; add/abd and flutter kicking   Walking ue supported on white barbell and CGA: forward x 4 widths then backward x 4 widths Seated rest period Return to seated exercises Side stepping x 4 widths. Stair climbing instruction  total of 6 step with bilat hand rails.  Completes with alternating pattern and cga.  Pt requires the buoyancy and hydrostatic pressure of water for support, and to offload joints by unweighting joint load by at least 50 % in navel deep water and by at least 75-80% in chest to neck deep water.  Viscosity of the water is needed for resistance of strengthening. Water current perturbations provides challenge to standing balance requiring increased core activation.    PATIENT EDUCATION:  Education details: Discussed eval findings, rehab rationale and POC and patient is in agreement Person educated: Patient and Quarry manager Education method: Explanation Education comprehension: verbalized understanding  HOME EXERCISE PROGRAM: Increase sitting times daily up towards 2 hours in 30 minute increments  ASSESSMENT:  CLINICAL IMPRESSION: Pt requires assistance of therapist in pool for safety. Focus today on gentle exercises in seated position and acclimation to setting with amb in 3.6 ft then 4.2 ft. She requires cga for safety and balance. She does fatigue quickly requiring 2 seated rest periods. She does not report any pain throughout.  Uses lift to conserve energy initially getting in and out of pool.  She is able to climb stairs as well. Goals ongoing. Pt ended session in Jacuzzi x 5 mins at her request. Dustin Flock)  Patient is a 76 y.o. f who was seen today for physical therapy evaluation and treatment for general debilitation after Whipple procedure and chemotherapy. She is accompanied by neighbor/friend who  is now residing with her to care give. Pt presents in a very weakened state in WC. Reports she doesn't really tolerate sitting very well >30 minutes when asked and tolerates standing (with AD) for <5 minutes. She does amb through home using a rollator. Pt states she is comfortable in a pool and is hoping to regain some strength. She is a good candidate for aquatics although we will trial toleration.  I do not believe she will tolerate a 45 min session. May need to stop at 30 mins if not before with allowance for multiple rest periods. We will start with 1 x week working towards 2 when/if able.  OBJECTIVE IMPAIRMENTS: decreased activity tolerance, decreased balance, decreased endurance, decreased mobility, decreased strength, and impaired sensation.   ACTIVITY LIMITATIONS: carrying, lifting, bending, sitting, standing, squatting, stairs, transfers, and bed mobility  PARTICIPATION LIMITATIONS: meal prep, cleaning, laundry, driving, shopping, community activity, yard work, and church  PERSONAL FACTORS: Age, Past/current experiences, and 1-2 comorbidities: pancreatic cancer, S/p whipple procedure  are also affecting patient's functional outcome.   REHAB POTENTIAL: Fair Uncertain pt will tolerate  CLINICAL DECISION MAKING: Evolving/moderate complexity  EVALUATION COMPLEXITY: Moderate   GOALS: Goals reviewed with patient? Yes  SHORT TERM GOALS: Target date: 02/16/2022  Pt will tolerate aquatic sessions 1 x weekly and will consider increasing to 2x week to improve strength and function Baseline:unable to tolerate Goal status: INITIAL  2. Pt will be able to walk submerged with ue support as needed x 10 widths/169ft (total with recovery periods as needed)  Baseline: ~30 ft land based (reported)  Goal Status: INITIAL  LONG TERM GOALS: Target date: 03/16/2022   Foto goal of 56% to be met to demonstrate improved functional ability Baseline: 49% Goal status: INITIAL  2.  Pt will improve on 5x  STS test to <20s to demonstrate decreased fall risk/improvement in LE strength and balance Baseline: 28.81 Goal status: INITIAL  3.  Pt will improve on TUG test to <18s to demonstrate improved mobility Baseline:  26.63 Goal  status: INITIAL  4.  Pt will tolerate a full aquatic session x 2 weekly Baseline: unable to tolerate Goal status: INITIAL   PLAN:  PT FREQUENCY: 1-2x/week Will start with 1 x week for first 4 weeks to determine toleration  PT DURATION: 8 weeks  PLANNED INTERVENTIONS: Therapeutic exercises, Therapeutic activity, Neuromuscular re-education, Balance training, Gait training, Patient/Family education, Self Care, Joint mobilization, Joint manipulation, Stair training, Aquatic Therapy, Dry Needling, Spinal manipulation, Spinal mobilization, Cryotherapy, Moist heat, Splintting, Taping, Traction, Ultrasound, Ionotophoresis 4mg /ml Dexamethasone, Manual therapy, and Re-evaluation  PLAN FOR NEXT SESSION: Initiate aquatics: maybe only able to tolerate 30 mins initially. Work on [employer], toleration to activity, amb and balance to tolerance   Geni Bers, PT MPT 02/01/2022, 1:27 PM   Addend Corrie Dandy Tomma Lightning) Eber Ferrufino MPT 08/10/22 348p

## 2022-02-02 ENCOUNTER — Other Ambulatory Visit: Payer: Self-pay | Admitting: *Deleted

## 2022-02-02 ENCOUNTER — Inpatient Hospital Stay (HOSPITAL_BASED_OUTPATIENT_CLINIC_OR_DEPARTMENT_OTHER): Payer: Medicare Other | Admitting: Oncology

## 2022-02-02 ENCOUNTER — Inpatient Hospital Stay: Payer: Medicare Other

## 2022-02-02 VITALS — BP 140/82 | HR 100 | Temp 98.2°F | Resp 20 | Ht 60.0 in | Wt 103.0 lb

## 2022-02-02 DIAGNOSIS — C250 Malignant neoplasm of head of pancreas: Secondary | ICD-10-CM

## 2022-02-02 DIAGNOSIS — Z95828 Presence of other vascular implants and grafts: Secondary | ICD-10-CM | POA: Diagnosis not present

## 2022-02-02 LAB — CBC WITH DIFFERENTIAL (CANCER CENTER ONLY)
Abs Immature Granulocytes: 0.05 10*3/uL (ref 0.00–0.07)
Basophils Absolute: 0.2 10*3/uL — ABNORMAL HIGH (ref 0.0–0.1)
Basophils Relative: 1 %
Eosinophils Absolute: 0.6 10*3/uL — ABNORMAL HIGH (ref 0.0–0.5)
Eosinophils Relative: 5 %
HCT: 39.1 % (ref 36.0–46.0)
Hemoglobin: 13.1 g/dL (ref 12.0–15.0)
Immature Granulocytes: 0 %
Lymphocytes Relative: 10 %
Lymphs Abs: 1.3 10*3/uL (ref 0.7–4.0)
MCH: 28.6 pg (ref 26.0–34.0)
MCHC: 33.5 g/dL (ref 30.0–36.0)
MCV: 85.4 fL (ref 80.0–100.0)
Monocytes Absolute: 0.9 10*3/uL (ref 0.1–1.0)
Monocytes Relative: 7 %
Neutro Abs: 10.2 10*3/uL — ABNORMAL HIGH (ref 1.7–7.7)
Neutrophils Relative %: 77 %
Platelet Count: 390 10*3/uL (ref 150–400)
RBC: 4.58 MIL/uL (ref 3.87–5.11)
RDW: 13 % (ref 11.5–15.5)
WBC Count: 13.2 10*3/uL — ABNORMAL HIGH (ref 4.0–10.5)
nRBC: 0 % (ref 0.0–0.2)

## 2022-02-02 LAB — CMP (CANCER CENTER ONLY)
ALT: 27 U/L (ref 0–44)
AST: 33 U/L (ref 15–41)
Albumin: 3.5 g/dL (ref 3.5–5.0)
Alkaline Phosphatase: 162 U/L — ABNORMAL HIGH (ref 38–126)
Anion gap: 13 (ref 5–15)
BUN: 23 mg/dL (ref 8–23)
CO2: 23 mmol/L (ref 22–32)
Calcium: 8.8 mg/dL — ABNORMAL LOW (ref 8.9–10.3)
Chloride: 96 mmol/L — ABNORMAL LOW (ref 98–111)
Creatinine: 1.06 mg/dL — ABNORMAL HIGH (ref 0.44–1.00)
GFR, Estimated: 54 mL/min — ABNORMAL LOW (ref >60)
Glucose, Bld: 168 mg/dL — ABNORMAL HIGH (ref 70–99)
Potassium: 4.1 mmol/L (ref 3.5–5.1)
Sodium: 132 mmol/L — ABNORMAL LOW (ref 135–145)
Total Bilirubin: 0.6 mg/dL (ref 0.3–1.2)
Total Protein: 6.2 g/dL — ABNORMAL LOW (ref 6.5–8.1)

## 2022-02-02 LAB — CEA (ACCESS): CEA (CHCC): 30.05 ng/mL — ABNORMAL HIGH (ref 0.00–5.00)

## 2022-02-02 LAB — MAGNESIUM: Magnesium: 1.7 mg/dL (ref 1.7–2.4)

## 2022-02-02 MED ORDER — LORAZEPAM 0.5 MG PO TABS: 0.2500 mg | ORAL_TABLET | Freq: Three times a day (TID) | ORAL | 0 refills | Status: AC | PRN

## 2022-02-02 MED ORDER — HEPARIN SOD (PORK) LOCK FLUSH 100 UNIT/ML IV SOLN
500.0000 [IU] | Freq: Once | INTRAVENOUS | Status: AC
  Administered 2022-02-02: 500 [IU] via INTRAVENOUS

## 2022-02-02 MED ORDER — SODIUM CHLORIDE 0.9% FLUSH
10.0000 mL | INTRAVENOUS | Status: DC | PRN
  Administered 2022-02-02: 10 mL via INTRAVENOUS

## 2022-02-02 NOTE — Progress Notes (Signed)
Tonya Jenkins OFFICE PROGRESS NOTE   Diagnosis: Pancreas cancer  INTERVAL HISTORY:   Tonya Jenkins returns as scheduled.  She is here with her friend.  She has been taking Creon more consistently.  She has persistent diarrhea, 4-5 times per day.  She has nausea.  Zofran and Compazine do not relieve the nausea.  She reports staying in bed or on the couch "99% "of the day.  Objective:  Vital signs in last 24 hours:  Blood pressure (!) 140/82, pulse 100, temperature 98.2 F (36.8 C), temperature source Oral, resp. rate 20, height 5' (1.524 m), weight 103 lb (46.7 kg), SpO2 95 %.    Resp: Decreased breath sounds at the right lower posterior chest, end inspiratory rhonchi at the right upper posterior chest, dullness to percussion at the right lower chest Cardio: Regular rate and rhythm GI: Firm fullness at the right greater than left upper abdomen, no discrete mass, no hepatosplenomegaly, no apparent ascites Vascular: No leg edema    Portacath/PICC-without erythema  Lab Results:  Lab Results  Component Value Date   WBC 13.2 (H) 02/02/2022   HGB 13.1 02/02/2022   HCT 39.1 02/02/2022   MCV 85.4 02/02/2022   PLT 390 02/02/2022   NEUTROABS 10.2 (H) 02/02/2022    CMP  Lab Results  Component Value Date   NA 132 (L) 02/02/2022   K 4.1 02/02/2022   CL 96 (L) 02/02/2022   CO2 23 02/02/2022   GLUCOSE 168 (H) 02/02/2022   BUN 23 02/02/2022   CREATININE 1.06 (H) 02/02/2022   CALCIUM 8.8 (L) 02/02/2022   PROT 6.2 (L) 02/02/2022   ALBUMIN 3.5 02/02/2022   AST 33 02/02/2022   ALT 27 02/02/2022   ALKPHOS 162 (H) 02/02/2022   BILITOT 0.6 02/02/2022   GFRNONAA 54 (L) 02/02/2022    Lab Results  Component Value Date   CEA 30.05 (H) 02/02/2022   BDZ329 <2 10/14/2021      Medications: I have reviewed the patient's current medications.   Assessment/Plan:  Pancreas cancer, stage III (pT2pN2) CT abdomen/pelvis 05/12/2021-dilated intrahepatic and [place] bile ducts,  abrupt narrowing of the common bile duct and pancreatic duct at the level of pancreas head, distended gallbladder MRI/MRCP 05/13/2018 23-3.6 x 2.1 x 4.2 cm enhancing mass in the head of the pancreas with obstruction of the pancreatic duct and common bile duct, no lymphadenopathy, no liver lesions Internal/external biliary drain 05/14/2021 CT chest 05/14/2021-small right pleural effusion, no evidence of metastatic disease Thoracentesis 05/16/2021-negative cytology Pancreat[facility]uodenectomy at [facility] 06/22/2021-no evidence of metastatic disease, inflamed gallbladder with percutaneous biliary drain, large pancreas head mass-gallbladder with acute cholecystitis and abscess formation negative for malignancy.  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, negative resection margins, 6/36 lymph nodes with metastatic adenocarcinoma, lymphovascular and perineural invasion CT angio chest and CT abdomen/pelvis 08/04/2021-negative for pulmonary embolism, status post Whipple procedure, 11 mm soft tissue nodule along the jejunal mesentery-nonspecific Cycle 1 gemcitabine/Abraxane 08/19/2021 Cycle 2 gemcitabine/Abraxane 09/02/2021 Cycle 3 gemcitabine/Abraxane 09/16/2021 Cycle 4 gemcitabine/Abraxane 09/30/2021 Cycle 5 gemcitabine/Abraxane 10/14/2021 Treatment held 10/28/2021 due to neutropenia, failure to thrive/poor performance status CTs 11/02/2021-stable abdominal retroperitoneal soft tissue thickening and central mesenteric lymph node; a few new tiny upper lobe pulmonary nodules; small to moderate pericardial effusion, increased; severe hepatic steatosis. Mild elevation of the CEA at [facility] 12/16/2021 CT chest 12/24/2021 (done to evaluate right chest pain)-negative for acute pulmonary embolus; small right-sided pleural effusion; several ill-defined bilateral pulmonary nodules, indeterminate.  Hepatic steatosis.  Small circumferential pericardial effusion. Postoperative  wound dehiscence-status post fascial closure  and placement of a wound VAC 07/02/2021 Chronic numbness of the right lower leg and foot Hypertension Left leg edema-negative bilateral venous Doppler 08/19/2021 Diarrhea-likely secondary to pancreas insufficiency, Creon started 09/15/2021 Pericardial effusion on CT 11/02/2021; 2D echo 11/05/2021-small to moderate pericardial effusion surrounds heart, most prominent along lateral surface of RV.  Does not appear to be hemodynamically significant by echo criteria.  Repeat 2D echo 11/26/2021-trivial pericardial effusion present.  The pericardial effusion is anterior to the right ventricle.      Disposition: [name] has a history of pancreas cancer.  She continues to have a poor performance status.  She is not a candidate for further chemotherapy in her current condition.  We remain concerned she could have metastatic disease.  She will return for a restaging chest CT and CEA in 2 weeks.  She will begin a trial of lorazepam for nausea.  Betsy Coder, MD  02/02/2022  11:34 AM

## 2022-02-02 NOTE — Addendum Note (Signed)
Addended by: Tania Ade on: 02/02/2022 12:46 PM   Modules accepted: Orders

## 2022-02-03 ENCOUNTER — Telehealth: Payer: Self-pay

## 2022-02-03 NOTE — Telephone Encounter (Signed)
TC from Mr Miyamoto stating  she is having  issues breathing when reposition on the couch. I advise the patient to go to the ED. She stated she going to put on her CPAP when lying on her back.

## 2022-02-04 ENCOUNTER — Encounter (HOSPITAL_COMMUNITY): Payer: Self-pay

## 2022-02-04 ENCOUNTER — Other Ambulatory Visit: Payer: Self-pay

## 2022-02-04 ENCOUNTER — Emergency Department (HOSPITAL_COMMUNITY): Payer: Medicare Other

## 2022-02-04 ENCOUNTER — Observation Stay (HOSPITAL_COMMUNITY)
Admission: EM | Admit: 2022-02-04 | Discharge: 2022-02-08 | Disposition: A | Discharge status: Home / Self Care | Payer: Medicare Other | Attending: Internal Medicine | Admitting: Internal Medicine

## 2022-02-04 DIAGNOSIS — M6281 Muscle weakness (generalized): Secondary | ICD-10-CM | POA: Diagnosis not present

## 2022-02-04 DIAGNOSIS — C250 Malignant neoplasm of head of pancreas: Secondary | ICD-10-CM | POA: Diagnosis not present

## 2022-02-04 DIAGNOSIS — J90 Pleural effusion, not elsewhere classified: Secondary | ICD-10-CM | POA: Diagnosis not present

## 2022-02-04 DIAGNOSIS — I3139 Other pericardial effusion (noninflammatory): Secondary | ICD-10-CM | POA: Insufficient documentation

## 2022-02-04 DIAGNOSIS — E43 Unspecified severe protein-calorie malnutrition: Secondary | ICD-10-CM | POA: Diagnosis not present

## 2022-02-04 DIAGNOSIS — Z66 Do not resuscitate: Secondary | ICD-10-CM | POA: Insufficient documentation

## 2022-02-04 DIAGNOSIS — R0602 Shortness of breath: Secondary | ICD-10-CM

## 2022-02-04 DIAGNOSIS — Z79899 Other long term (current) drug therapy: Secondary | ICD-10-CM | POA: Insufficient documentation

## 2022-02-04 DIAGNOSIS — E119 Type 2 diabetes mellitus without complications: Secondary | ICD-10-CM | POA: Diagnosis not present

## 2022-02-04 DIAGNOSIS — R110 Nausea: Secondary | ICD-10-CM

## 2022-02-04 DIAGNOSIS — R911 Solitary pulmonary nodule: Secondary | ICD-10-CM | POA: Insufficient documentation

## 2022-02-04 DIAGNOSIS — Z681 Body mass index (BMI) 19 or less, adult: Secondary | ICD-10-CM | POA: Diagnosis not present

## 2022-02-04 DIAGNOSIS — C259 Malignant neoplasm of pancreas, unspecified: Secondary | ICD-10-CM | POA: Diagnosis present

## 2022-02-04 DIAGNOSIS — I10 Essential (primary) hypertension: Secondary | ICD-10-CM | POA: Insufficient documentation

## 2022-02-04 DIAGNOSIS — J9601 Acute respiratory failure with hypoxia: Secondary | ICD-10-CM | POA: Insufficient documentation

## 2022-02-04 DIAGNOSIS — Z9889 Other specified postprocedural states: Secondary | ICD-10-CM

## 2022-02-04 DIAGNOSIS — R634 Abnormal weight loss: Secondary | ICD-10-CM | POA: Diagnosis not present

## 2022-02-04 DIAGNOSIS — G4733 Obstructive sleep apnea (adult) (pediatric): Secondary | ICD-10-CM | POA: Insufficient documentation

## 2022-02-04 DIAGNOSIS — R627 Adult failure to thrive: Secondary | ICD-10-CM | POA: Diagnosis not present

## 2022-02-04 DIAGNOSIS — G629 Polyneuropathy, unspecified: Secondary | ICD-10-CM

## 2022-02-04 DIAGNOSIS — R2681 Unsteadiness on feet: Secondary | ICD-10-CM | POA: Diagnosis not present

## 2022-02-04 HISTORY — DX: Malignant (primary) neoplasm, unspecified: C80.1

## 2022-02-04 LAB — COMPREHENSIVE METABOLIC PANEL
ALT: 30 U/L (ref 0–44)
AST: 37 U/L (ref 15–41)
Albumin: 2.7 g/dL — ABNORMAL LOW (ref 3.5–5.0)
Alkaline Phosphatase: 146 U/L — ABNORMAL HIGH (ref 38–126)
Anion gap: 13 (ref 5–15)
BUN: 23 mg/dL (ref 8–23)
CO2: 21 mmol/L — ABNORMAL LOW (ref 22–32)
Calcium: 8.7 mg/dL — ABNORMAL LOW (ref 8.9–10.3)
Chloride: 99 mmol/L (ref 98–111)
Creatinine, Ser: 0.98 mg/dL (ref 0.44–1.00)
GFR, Estimated: 60 mL/min — ABNORMAL LOW (ref >60)
Glucose, Bld: 113 mg/dL — ABNORMAL HIGH (ref 70–99)
Potassium: 3.9 mmol/L (ref 3.5–5.1)
Sodium: 133 mmol/L — ABNORMAL LOW (ref 135–145)
Total Bilirubin: 0.9 mg/dL (ref 0.3–1.2)
Total Protein: 5.8 g/dL — ABNORMAL LOW (ref 6.5–8.1)

## 2022-02-04 LAB — CBC WITH DIFFERENTIAL/PLATELET
Abs Immature Granulocytes: 0.05 10*3/uL (ref 0.00–0.07)
Basophils Absolute: 0.1 10*3/uL (ref 0.0–0.1)
Basophils Relative: 1 %
Eosinophils Absolute: 0.4 10*3/uL (ref 0.0–0.5)
Eosinophils Relative: 3 %
HCT: 37.7 % (ref 36.0–46.0)
Hemoglobin: 12.5 g/dL (ref 12.0–15.0)
Immature Granulocytes: 0 %
Lymphocytes Relative: 14 %
Lymphs Abs: 1.8 10*3/uL (ref 0.7–4.0)
MCH: 28.2 pg (ref 26.0–34.0)
MCHC: 33.2 g/dL (ref 30.0–36.0)
MCV: 85.1 fL (ref 80.0–100.0)
Monocytes Absolute: 1 10*3/uL (ref 0.1–1.0)
Monocytes Relative: 7 %
Neutro Abs: 9.9 10*3/uL — ABNORMAL HIGH (ref 1.7–7.7)
Neutrophils Relative %: 75 %
Platelets: 365 10*3/uL (ref 150–400)
RBC: 4.43 MIL/uL (ref 3.87–5.11)
RDW: 13.2 % (ref 11.5–15.5)
WBC: 13.2 10*3/uL — ABNORMAL HIGH (ref 4.0–10.5)
nRBC: 0 % (ref 0.0–0.2)

## 2022-02-04 LAB — TROPONIN I (HIGH SENSITIVITY)
Troponin I (High Sensitivity): 15 ng/L (ref <18)
Troponin I (High Sensitivity): 16 ng/L (ref <18)

## 2022-02-04 LAB — BRAIN NATRIURETIC PEPTIDE: B Natriuretic Peptide: 86.3 pg/mL (ref 0.0–100.0)

## 2022-02-04 LAB — CANCER ANTIGEN 19-9: CA 19-9: <2 U/mL (ref 0–35)

## 2022-02-04 MED ORDER — ONDANSETRON HCL 4 MG/2ML IJ SOLN
4.0000 mg | Freq: Four times a day (QID) | INTRAMUSCULAR | Status: DC | PRN
  Administered 2022-02-06: 4 mg via INTRAVENOUS
  Filled 2022-02-04: qty 2

## 2022-02-04 MED ORDER — IOHEXOL 350 MG/ML SOLN
75.0000 mL | Freq: Once | INTRAVENOUS | Status: AC | PRN
  Administered 2022-02-04: 75 mL via INTRAVENOUS

## 2022-02-04 MED ORDER — HYDROXYZINE HCL 25 MG PO TABS
25.0000 mg | ORAL_TABLET | Freq: Every evening | ORAL | Status: DC | PRN
  Administered 2022-02-06 – 2022-02-07 (×3): 25 mg via ORAL
  Filled 2022-02-04 (×4): qty 1

## 2022-02-04 MED ORDER — ACETAMINOPHEN 325 MG PO TABS: 325.0000 mg | ORAL_TABLET | Freq: Four times a day (QID) | ORAL | Status: DC | PRN

## 2022-02-04 MED ORDER — DILTIAZEM HCL ER 180 MG PO CP24: 180.0000 mg | ORAL_CAPSULE | Freq: Every day | ORAL | Status: DC

## 2022-02-04 MED ORDER — DILTIAZEM HCL ER COATED BEADS 180 MG PO CP24
180.0000 mg | ORAL_CAPSULE | Freq: Every day | ORAL | Status: DC
  Administered 2022-02-05 – 2022-02-07 (×4): 180 mg via ORAL
  Filled 2022-02-04 (×4): qty 1

## 2022-02-04 MED ORDER — PANCRELIPASE (LIP-PROT-AMYL) 12000-38000 UNITS PO CPEP
36000.0000 [IU] | ORAL_CAPSULE | Freq: Three times a day (TID) | ORAL | Status: DC
  Administered 2022-02-05 – 2022-02-08 (×7): 36000 [IU] via ORAL
  Filled 2022-02-04 (×2): qty 1
  Filled 2022-02-04 (×3): qty 3
  Filled 2022-02-04: qty 1
  Filled 2022-02-04 (×5): qty 3

## 2022-02-04 NOTE — H&P (Signed)
History and Physical    Patient: Tonya Jenkins NWG:[id number] DOB: [birth year] DOA: 02/04/2022 DOS: the patient was seen and examined on 02/05/2022 PCP: [name], MD  Patient coming from: Home  Chief Complaint:  Chief Complaint  Patient presents with   Shortness of Breath   HPI: Tonya Jenkins is a 76 y.o. female with medical history significant of pancreatic cancer s/p pancreaticoduodenectomy and adjuvant chemotherapy later discontinued due to failure to thrive, HTN, T2DM, OSA presents with increasing shortness of breath.  Per oncology, patient reportedly has began to have failure to thrive following her pancreaticoduodenectomy in April.  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 For the past 3 days she has noticed increasing shortness of breath. No cough or fever. No nausea, vomiting or diarrhea.    In the ED, temperature of 98.1, BP 157/99, heart rate of 103.  Chest x-ray shows moderate right and trace left effusion. Dedicated CTA chest ruled out pulmonary embolism.  However confirmed bilateral pleural effusion right greater than left.  Had small stable pericardial effusion unchanged from prior.  She was evaluated by her primary oncologist Dr. Benay Spice in the ED who advised that she be admitted for diagnostic/therapeutic right thoracentesis.  Review of Systems: As mentioned in the history of present illness. All other systems reviewed and are negative. Past Medical History:  Diagnosis Date   Cancer [facility])    History of hip surgery    Hx of neck surgery    Hypertension    Past Surgical History:  Procedure Laterality Date   CERVICAL SPINE SURGERY     ERCP N/A 05/13/2021   Procedure: ENDOSCOPIC RETROGRADE CHOLANGIOPANCREATOGRAPHY (ERCP);  Surgeon: Carol Ada, MD;  Location: [place];  Service: Gastroenterology;  Laterality: N/A;   ESOPHAGOGASTRODUODENOSCOPY (EGD) WITH PROPOFOL N/A 05/14/2021   Procedure:  ESOPHAGOGASTRODUODENOSCOPY (EGD) WITH PROPOFOL;  Surgeon: Carol Ada, MD;  Location: [place];  Service: Gastroenterology;  Laterality: N/A;   EUS Left 05/14/2021   Procedure: UPPER ENDOSCOPIC ULTRASOUND (EUS) LINEAR;  Surgeon: Carol Ada, MD;  Location: [place];  Service: Gastroenterology;  Laterality: Left;   FINE NEEDLE ASPIRATION  05/14/2021   Procedure: FINE NEEDLE ASPIRATION (FNA) LINEAR;  Surgeon: Carol Ada, MD;  Location: [place];  Service: Gastroenterology;;   HAMMER TOE SURGERY     HIP SURGERY     IR IMAGING GUIDED PORT INSERTION  08/16/2021   IR INT EXT BILIARY DRAIN WITH CHOLANGIOGRAM  05/13/2021   IR PATIENT EVAL TECH 0-60 MINS  05/20/2021   KNEE SURGERY     PANCREATICODUODENECTOMY  06/22/2021   stated per pt.   Social History:  reports that she has never smoked. She has never used smokeless tobacco. She reports that she does not currently use alcohol. She reports that she does not currently use drugs.  Allergies  Allergen Reactions   Amlodipine Besylate Other (See Comments)    Severe headaches   Hydrochlorothiazide Other (See Comments)    Hallucinations   Lexapro [Escitalopram Oxalate]     Pt stated, "It gave me hallucinations"   Oxycodone Other (See Comments)    "I go crazy"   Penicillins Other (See Comments)    Reaction not recalled    Prednisone Other (See Comments)    Past issue with this    Family History  Problem Relation Age of Onset   Prostate cancer Brother        mets; d. 10s    Prior to Admission medications   Medication Sig Start Date End Date  Taking? Authorizing Provider  B Complex Vitamins (VITAMIN B COMPLEX PO) Take 1 capsule by mouth daily.   Yes [provider]  diltiazem (DILACOR XR) 180 MG 24 hr capsule Take 180 mg by mouth at bedtime. 04/27/21  Yes [provider]  hydrOXYzine (ATARAX) 25 MG tablet Take 1 tablet (25 mg total) by mouth at bedtime as needed (for sleep). 11/11/21  Yes [name], MD   lidocaine-prilocaine (EMLA) cream Apply 1 Application topically as directed. Apply 1/2 tablespoon to port site and cover with Press-and-Seal 2 hours prior to stick to numb site Patient taking differently: Apply 1 Application topically See admin instructions. Apply 1/2 tablespoonful to port site and cover with press-and-seal 2 hours prior to stick- to numb site 12/13/21  Yes [name], MD  lipase/protease/amylase (CREON) 36000 UNITS CPEP capsule Take 1 capsule (36,000 Units total) by mouth 3 (three) times daily with meals. May also take 1 capsule (36,000 Units total) as needed (with snacks). Patient taking differently: Take 1 capsule (36,000 Units total) by mouth 3 (three) times daily with meals and any snacks 01/11/22  Yes [name], MD  LORazepam (ATIVAN) 0.5 MG tablet Take 0.5-1 tablets (0.25-0.5 mg total) by mouth every 8 (eight) hours as needed (nausea). 02/02/22  Yes [name], MD  MELATONIN PO Take 6 drops by mouth at bedtime.   Yes [provider]  ondansetron (ZOFRAN) 8 MG tablet Take 8 mg by mouth every 8 (eight) hours as needed for nausea or vomiting.   Yes [provider]  potassium chloride (MICRO-K) 10 MEQ CR capsule Take 1 capsule (10 mEq total) by mouth 2 (two) times daily. 12/20/21  Yes [name], MD  predniSONE (DELTASONE) 10 MG tablet Take 1 tablet (10 mg total) by mouth daily with breakfast. Patient not taking: Reported on 02/02/2022 01/26/22   [name], MD  Probiotic Product (PROBIOTIC PO) Take 2 capsules by mouth daily.   Yes [provider]  prochlorperazine (COMPAZINE) 10 MG tablet Take 1 tablet (10 mg total) by mouth every 6 (six) hours as needed for nausea or vomiting. 08/19/21  Yes Owens Shark, NP  TYLENOL 325 MG tablet Take 325 mg by mouth every 6 (six) hours as needed for mild pain or headache.   Yes [provider]  Vitamin D, Ergocalciferol, (DRISDOL) 1.25 MG (50000 UNIT) CAPS capsule Take 50,000  Units by mouth every Tuesday. 05/28/21  Yes [provider]    Physical Exam: Vitals:   02/04/22 2203 02/04/22 2245 02/04/22 2303 02/05/22 0030  BP:   (!) 158/90 119/79  Pulse:  (!) 105 (!) 106   Resp:  (!) 21 (!) 21   Temp: 97.9 F (36.6 C)     TempSrc: Oral     SpO2:  92% 92%   Weight:      Height:       Constitutional: NAD, calm, comfortable, thin chronically ill-appearing elderly female laying on her right side in bed Eyes:  lids and conjunctivae normal ENMT: Mucous membranes are moist.  Neck: normal, supple,  Respiratory: clear to auscultation bilaterally with diminished right base lung sounds.  Normal respiratory effort. No accessory muscle use.  Cardiovascular: Regular rate and rhythm, no murmurs / rubs / gallops. No extremity edema.   Abdomen: no tenderness, Bowel sounds positive.  Musculoskeletal: no clubbing / cyanosis. No joint deformity upper and lower extremities. Good ROM, no contractures. Normal muscle tone.  Skin: no rashes, lesions, ulcers. No induration  Neurologic: CN 2-12 grossly intact.  Strength 5/5 in all 4.  Psychiatric: Normal judgment and insight. Alert and oriented x 3. Normal mood. Data Reviewed:  See HPI  Assessment and Plan: * Pleural effusion -CTA chest with bilateral pleural effusion right greater than left. -obtain thoracentesis with fluid study to evaluate malignancy -NPO past midnight -continuous pulse oximetry. Goal O2 saturation greater than 92%  Essential hypertension Continue diltiazem  Pericardial effusion - Small stable pericardial effusion seen on CTA chest.  No signs of any cardiac tamponade.  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 Benay Spice with oncology who evaluated her in the ED.   Recommend reconsulting PRN otherwise he will follow her outpatient  OSA on CPAP Continue nighttime CPAP      Advance Care Planning: DNR- after discussion in detail pt decided she wants a natural passing   Consults: none  Family Communication: none at bedside  Severity of Illness: The appropriate patient status for this patient is OBSERVATION. Observation status is judged to be reasonable and necessary in order to provide the required intensity of service to ensure the patient's safety. The patient's presenting symptoms, physical exam findings, and initial radiographic and laboratory data in the context of their medical condition is felt to place them at decreased risk for further clinical deterioration. Furthermore, it is anticipated that the patient will be medically stable for discharge from the hospital within 2 midnights of admission.   Author: Orene Desanctis, DO 02/05/2022 12:47 AM  For on call review [email or web address].

## 2022-02-04 NOTE — ED Notes (Signed)
Pt would prefer for blood work to be done through her port.

## 2022-02-04 NOTE — Assessment & Plan Note (Signed)
-   Small stable pericardial effusion seen on CTA chest.  No signs of any cardiac tamponade.

## 2022-02-04 NOTE — Assessment & Plan Note (Signed)
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 Benay Spice with oncology who evaluated her in the ED.  Recommend reconsulting PRN and he will follow her outpatient

## 2022-02-04 NOTE — Progress Notes (Signed)
IP PROGRESS NOTE  Subjective:   Ms. Klare is known to me with a history of pancreas cancer.  She underwent a pancreaticoduodenectomy in April followed by an abbreviated course of adjuvant gemcitabine/Abraxane.  She developed persistent failure to thrive and was unable to continue adjuvant chemotherapy.  Restaging evaluations to date have not revealed evidence of recurrent disease.  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  She has chronic diarrhea.  When I saw her in the office on 02/02/2022 she had persistent failure to thrive, staying in bed most of the time.  She complained of persistent nausea.  She was prescribed lorazepam.  She was scheduled for a restaging CT evaluation and follow-up office visit.   Ms. Hollen presented to the emergency room today with shortness of breath.  She reports increased shortness of breath for the past 2 days.  She has orthopnea.  She feels better when lying on the right side. Objective: Vital signs in last 24 hours: Blood pressure (!) 148/87, pulse (!) 101, temperature 97.8 F (36.6 C), temperature source Oral, resp. rate 18, height 5' (1.524 m), weight 101 lb (45.8 kg), SpO2 93 %.  Intake/Output from previous day: No intake/output data recorded.  Physical Exam:  HEENT: No thrush Lungs: Decreased breath sounds at the right lower posterior chest, increased respiratory rate Cardiac: Regular rate and rhythm, the neck veins are distended Abdomen: Distended, no hepatosplenomegaly, firm fullness at the upper abdomen to the right greater than left side of the midline scar Extremities: No leg edema   Portacath/PICC-without erythema  Lab Results: Recent Labs    02/02/22 1041 02/04/22 1645  WBC 13.2* 13.2*  HGB 13.1 12.5  HCT 39.1 37.7  PLT 390 365    BMET Recent Labs    02/02/22 1041  NA 132*  K 4.1  CL 96*  CO2 23  GLUCOSE 168*  BUN 23  CREATININE 1.06*  CALCIUM 8.8*     Lab Results  Component Value Date   CEA 30.05 (H) 02/02/2022   CHE527 <2 02/02/2022    Studies/Results: DG Chest 2 View  Result Date: 02/04/2022 CLINICAL DATA:  Shortness of breath EXAM: CHEST - 2 VIEW COMPARISON:  Radiograph 12/27/2021 FINDINGS: Chest port catheter tip overlies the distal SVC. Moderate right pleural effusion with adjacent lower lung consolidation/atelectasis. There is a focal opacity in the right upper lobe. Trace left pleural effusion with adjacent basilar atelectasis. No evidence of pneumothorax. No acute osseous abnormality. Thoracic spondylosis. Cervical spine fusion hardware noted. IMPRESSION: Moderate right and trace left pleural effusions with adjacent basilar opacities/atelectasis. Focal opacity in the right upper lung, possibly layering pleural fluid or superimposed airspace disease/infection. Electronically Signed   By: Maurine Simmering M.D.   On: 02/04/2022 16:01    Medications: I have reviewed the patient's current medications.  Assessment/Plan: Pancreas cancer, stage III (pT2pN2) CT abdomen/pelvis 05/12/2021-dilated intrahepatic and [place] bile ducts, abrupt narrowing of the common bile duct and pancreatic duct at the level of pancreas head, distended gallbladder MRI/MRCP 05/13/2018 23-3.6 x 2.1 x 4.2 cm enhancing mass in the head of the pancreas with obstruction of the pancreatic duct and common bile duct, no lymphadenopathy, no liver lesions Internal/external biliary drain 05/14/2021 CT chest 05/14/2021-small right pleural effusion, no evidence of metastatic disease Thoracentesis 05/16/2021-negative cytology Panc[facility]ectomy at [facility] 06/22/2021-no evidence of metastatic disease, inflamed gallbladder with percutaneous biliary drain, large pancreas head mass-gallbladder with acute cholecystitis and abscess formation negative for malignancy.  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, negative resection margins, 6/36 lymph nodes with metastatic  adenocarcinoma, lymphovascular and perineural invasion CT angio chest and CT abdomen/pelvis 08/04/2021-negative for pulmonary embolism, status post Whipple procedure, 11 mm soft tissue nodule along the jejunal mesentery-nonspecific Cycle 1 gemcitabine/Abraxane 08/19/2021 Cycle 2 gemcitabine/Abraxane 09/02/2021 Cycle 3 gemcitabine/Abraxane 09/16/2021 Cycle 4 gemcitabine/Abraxane 09/30/2021 Cycle 5 gemcitabine/Abraxane 10/14/2021 Treatment held 10/28/2021 due to neutropenia, failure to thrive/poor performance status CTs 11/02/2021-stable abdominal retroperitoneal soft tissue thickening and central mesenteric lymph node; a few new tiny upper lobe pulmonary nodules; small to moderate pericardial effusion, increased; severe hepatic steatosis. Mild elevation of the CEA at [facility] 12/16/2021 CT chest 12/24/2021 (done to evaluate right chest pain)-negative for acute pulmonary embolus; small right-sided pleural effusion; several ill-defined bilateral pulmonary nodules, indeterminate.  Hepatic steatosis.  Small circumferential pericardial effusion. Postoperative wound dehiscence-status post fascial closure and placement of a wound VAC 07/02/2021 Chronic numbness of the right lower leg and foot Hypertension Left leg edema-negative bilateral venous Doppler 08/19/2021 Diarrhea-likely secondary to pancreas insufficiency, Creon started 09/15/2021 Pericardial effusion on CT 11/02/2021; 2D echo 11/05/2021-small to moderate pericardial effusion surrounds heart, most prominent along lateral surface of RV.  Does not appear to be hemodynamically significant by echo criteria.  Repeat 2D echo 11/26/2021-trivial pericardial effusion present.  The pericardial effusion is anterior to the right ventricle. Presentation to the emergency room 02/04/2022 with dyspnea/orthopnea Chest x-ray with a large right pleural effusion   Ms. Torosian has a history of pancreas cancer.  He has experienced failure to thrive since undergoing the  pancreaticoduodenectomy in April.  She has a poor performance status.  She presents today with increased dyspnea.  She has a large right pleural effusion.  I suspect the dyspnea is related to the pleural effusion.  The neck veins are distended.  She could also have a pericardial effusion.  I have a low clinical suspicion for pulmonary embolism or infection.  She most likely has a malignant effusion.  She may also have ascites.  I discussed the probable diagnosis with Ms. Wetherbee.  She will need to be admitted for a diagnostic/therapeutic thoracentesis.  She understands I will recommend hospice care if a malignant effusion is confirmed.  We discussed the possibility of Pleurx catheter placement.  Discussed CPR and ACLS.  She would like to remain on a full CODE STATUS for now.  Recommendations: Diagnostic/therapeutic thoracentesis-send fluid for cytology, cell count, and culture Center echocardiogram depending on the chest CT findings Continue Creon 4.   Please call Oncology as needed.  I will see her 02/07/2022 and outpatient follow-up will be scheduled at the Cancer center    LOS: 0 days   Betsy Coder, MD   02/04/2022, 5:24 PM

## 2022-02-04 NOTE — ED Triage Notes (Signed)
Patient reports that when she is laying down she has SOB and feels like she is gasping for air. Patient states that she has pancreatic cancer and states that they did find spots on her lungs. O2 Sats in triage-95% on room air.

## 2022-02-04 NOTE — Assessment & Plan Note (Signed)
-  CTA chest with bilateral pleural effusion right greater than left. -obtain thoracentesis with fluid study to evaluate malignancy -NPO past midnight -continuous pulse oximetry. Goal O2 saturation greater than 92%

## 2022-02-04 NOTE — ED Provider Notes (Addendum)
[facility] DEPT Provider Note   CSN: [id number] Arrival date & time: 02/04/22  1400     History  Chief Complaint  Patient presents with   Shortness of Breath    Tonya Jenkins is a 76 y.o. female.  Patient presenting with tach, plaint of shortness of breath positional in nature no chest pain associated with it.  Patient seen the end of October at that time had chest pain without shortness of breath had CT angio that was negative.  Patient is followed by hematology [facility]ology [facility] by Dr. Benay Spice.  Patient last seen by them November 2019.  Patient diagnosed with pancreatic cancer in March.  In April 2023 at [facility] patient underwent pancreatic O duodenectomy no evidence of metastatic disease was inflamed gallbladder with percutaneous biliary drain large pancreas head mass gallbladder with acute cholecystitis and abscess formation negative for malignancy.  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  Patient is currently not receiving any treatments.  She is on a pause due to failure to thrive with the current treatments.  Patient has CT a chest in 1020 that was negative patient is struggled with some pleural effusions has had thoracentesis.  And patient has struggled with pericardial effusions as well.  Patient is not on blood thinners.  Past medical history otherwise history of hypertension.  Patient without any chest pain.  Patient without upper respiratory infection symptoms.  Patient without fever cough or congestion.  Patient's shortness of breath is very positional.  Most comfortable lying on her right side.       Home Medications Prior to Admission medications   Medication Sig Start Date End Date Taking? Authorizing Provider  predniSONE (DELTASONE) 10 MG tablet Take 1 tablet (10 mg total) by mouth daily with  breakfast. Patient not taking: Reported on 02/02/2022 01/26/22   [name], MD  B Complex Vitamins (VITAMIN B COMPLEX PO) Take 1 capsule by mouth daily.    [provider]  diltiazem (DILACOR XR) 180 MG 24 hr capsule Take 180 mg by mouth at bedtime. 04/27/21   [provider]  hydrOXYzine (ATARAX) 25 MG tablet Take 1 tablet (25 mg total) by mouth at bedtime as needed (for sleep). 11/11/21   [name], MD  lidocaine-prilocaine (EMLA) cream Apply 1 Application topically as directed. Apply 1/2 tablespoon to port site and cover with Press-and-Seal 2 hours prior to stick to numb site 12/13/21   [name], MD  lipase/protease/amylase (CREON) 36000 UNITS CPEP capsule Take 1 capsule (36,000 Units total) by mouth 3 (three) times daily with meals. May also take 1 capsule (36,000 Units total) as needed (with snacks). 01/11/22   [name], MD  LORazepam (ATIVAN) 0.5 MG tablet Take 0.5-1 tablets (0.25-0.5 mg total) by mouth every 8 (eight) hours as needed (nausea). 02/02/22   [name], MD  Melatonin 1 MG/4ML LIQD Take 1 mg by mouth once. Take at night for sleep    [provider]  NON FORMULARY CPAP at bedtime Patient not taking: Reported on 02/02/2022    [provider]  Ondansetron HCl (ZOFRAN PO) Take 8 mg by mouth every 8 (eight) hours as needed.    [provider]  potassium chloride (MICRO-K) 10 MEQ CR capsule Take 1 capsule (10 mEq total) by mouth 2 (two) times daily. 12/20/21   [name], MD  prochlorperazine (COMPAZINE) 10 MG  tablet Take 1 tablet (10 mg total) by mouth every 6 (six) hours as needed for nausea or vomiting. 08/19/21   Owens Shark, NP  Vitamin D, Ergocalciferol, (DRISDOL) 1.25 MG (50000 UNIT) CAPS capsule Take 50,000 Units by mouth once a week. 05/28/21   [provider]      Allergies    Amlodipine besylate, Hydrochlorothiazide, Lexapro [escitalopram oxalate], Oxycodone, and Penicillins     Review of Systems   Review of Systems  Constitutional:  Positive for fatigue. Negative for chills and fever.  HENT:  Negative for rhinorrhea and sore throat.   Eyes:  Negative for visual disturbance.  Respiratory:  Positive for shortness of breath. Negative for cough.   Cardiovascular:  Negative for chest pain and leg swelling.  Gastrointestinal:  Negative for abdominal pain, diarrhea, nausea and vomiting.  Genitourinary:  Negative for dysuria.  Musculoskeletal:  Negative for back pain and neck pain.  Skin:  Negative for rash.  Neurological:  Negative for dizziness, light-headedness and headaches.  Hematological:  Does not bruise/bleed easily.  Psychiatric/Behavioral:  Negative for confusion.     Physical Exam Updated Vital Signs BP (!) 148/87 (BP Location: Left Arm)   Pulse (!) 101   Temp 97.8 F (36.6 C) (Oral)   Resp 18   Ht 1.524 m (5')   Wt 45.8 kg   SpO2 93%   BMI 19.73 kg/m  Physical Exam Vitals and nursing note reviewed.  Constitutional:      General: She is not in acute distress.    Appearance: She is well-developed. She is ill-appearing.  HENT:     Head: Normocephalic and atraumatic.  Eyes:     Conjunctiva/sclera: Conjunctivae normal.  Cardiovascular:     Rate and Rhythm: Normal rate and regular rhythm.     Heart sounds: No murmur heard. Pulmonary:     Effort: Pulmonary effort is normal. No respiratory distress.     Breath sounds: Examination of the right-upper field reveals decreased breath sounds. Examination of the right-middle field reveals decreased breath sounds. Examination of the right-lower field reveals decreased breath sounds. Decreased breath sounds present. No wheezing, rhonchi or rales.  Abdominal:     Palpations: Abdomen is soft.     Tenderness: There is no abdominal tenderness.  Musculoskeletal:        General: No swelling. Normal range of motion.     Cervical back: Neck supple.  Skin:    General: Skin is warm and dry.     Capillary  Refill: Capillary refill takes less than 2 seconds.  Neurological:     General: No focal deficit present.     Mental Status: She is alert and oriented to person, place, and time.  Psychiatric:        Mood and Affect: Mood normal.     ED Results / Procedures / Treatments   Labs (all labs ordered are listed, but only abnormal results are displayed) Labs Reviewed  CBC WITH DIFFERENTIAL/PLATELET - Abnormal; Notable for the following components:      Result Value   WBC 13.2 (*)    Neutro Abs 9.9 (*)    All other components within normal limits  COMPREHENSIVE METABOLIC PANEL  BRAIN NATRIURETIC PEPTIDE  TROPONIN I (HIGH SENSITIVITY)  TROPONIN I (HIGH SENSITIVITY)    EKG EKG Interpretation  Date/Time:  Friday February 04 2022 14:17:12 EST Ventricular Rate:  103 PR Interval:  111 QRS Duration: 72 QT Interval:  333 QTC Calculation: 438 R Axis:   55 Text  Interpretation: Sinus tachycardia Low voltage, extremity and precordial leads Confirmed by Fredia Sorrow [phone]) on 02/04/2022 3:41:28 PM  Radiology DG Chest 2 View  Result Date: 02/04/2022 CLINICAL DATA:  Shortness of breath EXAM: CHEST - 2 VIEW COMPARISON:  Radiograph 12/27/2021 FINDINGS: Chest port catheter tip overlies the distal SVC. Moderate right pleural effusion with adjacent lower lung consolidation/atelectasis. There is a focal opacity in the right upper lobe. Trace left pleural effusion with adjacent basilar atelectasis. No evidence of pneumothorax. No acute osseous abnormality. Thoracic spondylosis. Cervical spine fusion hardware noted. IMPRESSION: Moderate right and trace left pleural effusions with adjacent basilar opacities/atelectasis. Focal opacity in the right upper lung, possibly layering pleural fluid or superimposed airspace disease/infection. Electronically Signed   By: Maurine Simmering M.D.   On: 02/04/2022 16:01    Procedures Procedures    Medications Ordered in ED Medications - No data to display  ED Course/  Medical Decision Making/ A&P                           Medical Decision Making Risk Decision regarding hospitalization.   Patient's work-up here chest x-ray significant for moderate right and trace left pleural effusions.  Questionable focal opacity right upper lobe.  Triage had ordered CT angio to rule out pulmonary embolus and this will also give [place] better view of her chest to rule out any pneumonia or recurrent neoplastic process.  Patient CBC white count 13 hemoglobin 12.5 platelets 365.  Complete metabolic panel is pending.  BMP is pending.  They also had ordered troponins but patient without any chest pain.  Clinically obvious due to concerns for pulmonary embolus however her CT angios in the past have been negative.  Symptoms could be all related to this moderate right pleural effusion.  Patient's oxygen sats are good at 95%.  Heart rate 105.  Not febrile.  Blood pressure good at 157/99.  But patient is symptomatic enough may need admission for thoracentesis.  Discussed with Dr. Ammie Dalton, patient's oncologist, he is recommending admission to have the pleural effusion drained and sent for cell studies.  He is highly suspicious this is metastatic in nature.  CT angio without evidence of pulmonary embolus.  And pretty much confirmed what was seen on the regular chest x-ray.  Will contact hospitalist for admission  Final Clinical Impression(s) / ED Diagnoses Final diagnoses:  SOB (shortness of breath)  Pleural effusion  Malignant neoplasm of head of pancreas [facility])    Rx / DC Orders ED Discharge Orders     None         Fredia Sorrow, MD 02/04/22 1716    Fredia Sorrow, MD 02/04/22 1914

## 2022-02-04 NOTE — ED Notes (Signed)
Patient transported to CT 

## 2022-02-04 NOTE — ED Provider Triage Note (Signed)
Emergency Medicine Provider Triage Evaluation Note  Brannon Decaire , a 76 y.o. female  was evaluated in triage.  Pt complains of increasing shortness of breath over the last 2 days, especially when lying flat/laying down.  When this occurs, feels as if she is gasping for air".  Hx pancreatic cancer (head; identified around 05/2021), had subsequent Whipple surgery with complications.  Recently "paused" chemotherapy 6 weeks ago due to FTT.  Per record review, noted pericardial effusion on CT 11/01/2021, 11/05/2021 echo, and 11/26/2021 echo.  Review of Systems  Positive:  Negative: See above  Physical Exam  There were no vitals taken for this visit. Gen:   Awake, no distress lying on side with upper body elevated Resp:  Mild increased respiratory effort, rate of 20-22 breaths/min.  Mildly diminished lung sounds of bilateral bases. MSK:   Moves extremities without difficulty  Other:  Appears clinically fatigued.  Chest non-TTP.  HR tachycardic rate with regular rhythm.  Does not appear pale or cyanotic.    Medical Decision Making  Medically screening exam initiated at 2:14 PM.  Appropriate orders placed.  Hayleen Clinkscales was informed that the remainder of the evaluation will be completed by another provider, this initial triage assessment does not replace that evaluation, and the importance of remaining in the ED until their evaluation is complete.  Labs and imaging ordered.  Discussed with triage nurse, patient to be brought back to next available room when possible.   Prince Rome, PA-C 32/44/01 1438

## 2022-02-04 NOTE — Assessment & Plan Note (Signed)
Continue diltiazem. 

## 2022-02-05 ENCOUNTER — Observation Stay (HOSPITAL_COMMUNITY): Payer: Medicare Other

## 2022-02-05 DIAGNOSIS — J90 Pleural effusion, not elsewhere classified: Secondary | ICD-10-CM

## 2022-02-05 DIAGNOSIS — R0602 Shortness of breath: Secondary | ICD-10-CM

## 2022-02-05 DIAGNOSIS — C250 Malignant neoplasm of head of pancreas: Secondary | ICD-10-CM

## 2022-02-05 LAB — BODY FLUID CELL COUNT WITH DIFFERENTIAL
Eos, Fluid: 2 %
Lymphs, Fluid: 8 %
Monocyte-Macrophage-Serous Fluid: 71 % (ref 50–90)
Neutrophil Count, Fluid: 19 % (ref 0–25)
Total Nucleated Cell Count, Fluid: 773 cu mm (ref 0–1000)

## 2022-02-05 LAB — LACTATE DEHYDROGENASE, PLEURAL OR PERITONEAL FLUID: LD, Fluid: 283 U/L — ABNORMAL HIGH (ref 3–23)

## 2022-02-05 LAB — CBC
HCT: 36.3 % (ref 36.0–46.0)
Hemoglobin: 12.2 g/dL (ref 12.0–15.0)
MCH: 28.4 pg (ref 26.0–34.0)
MCHC: 33.6 g/dL (ref 30.0–36.0)
MCV: 84.6 fL (ref 80.0–100.0)
Platelets: 378 10*3/uL (ref 150–400)
RBC: 4.29 MIL/uL (ref 3.87–5.11)
RDW: 13.2 % (ref 11.5–15.5)
WBC: 12.7 10*3/uL — ABNORMAL HIGH (ref 4.0–10.5)
nRBC: 0 % (ref 0.0–0.2)

## 2022-02-05 LAB — GRAM STAIN

## 2022-02-05 LAB — GLUCOSE, PLEURAL OR PERITONEAL FLUID: Glucose, Fluid: 84 mg/dL

## 2022-02-05 LAB — PROTEIN, PLEURAL OR PERITONEAL FLUID: Total protein, fluid: 3.5 g/dL

## 2022-02-05 MED ORDER — LIDOCAINE HCL 1 % IJ SOLN
INTRAMUSCULAR | Status: AC
  Administered 2022-02-05: 10 mL
  Filled 2022-02-05: qty 20

## 2022-02-05 MED ORDER — CHLORHEXIDINE GLUCONATE CLOTH 2 % EX PADS
6.0000 | MEDICATED_PAD | Freq: Every day | CUTANEOUS | Status: DC
  Administered 2022-02-06 – 2022-02-07 (×2): 6 via TOPICAL

## 2022-02-05 MED ORDER — SODIUM CHLORIDE 0.9% FLUSH: 10.0000 mL | INTRAVENOUS | Status: DC | PRN

## 2022-02-05 NOTE — ED Notes (Signed)
Hospitalist at bedside 

## 2022-02-05 NOTE — Procedures (Signed)
PROCEDURE SUMMARY:  Successful image-guided right thoracentesis. Yielded 1.0 liters of hazy yellow fluid. Patient tolerated procedure well. EBL < 1 mL No immediate complications.  Specimen was sent for labs. Post procedure CXR shows no pneumothorax.  Please see imaging section of Epic for full dictation.  Left chest assessed with [place] today and noted to have pleural effusion which is potentially amenable to future thoracentesis if patient is symptomatic.   Joaquim Nam PA-C 02/05/2022 12:56 PM

## 2022-02-05 NOTE — Hospital Course (Signed)
PMH of pancreatic cancer SP pancreatico duodenectomy and chemotherapy, failure to thrive, HTN, type II DM, OSA present to the hospital with complaints of shortness of breath found to have bilateral pleural effusion right more than left.  SP thoracentesis now.

## 2022-02-05 NOTE — ED Notes (Signed)
When this writer entered from to administer CREON, Pt had taken home medication.  Pt educated to not take home medications and pill bottle placed in belonging bag.

## 2022-02-05 NOTE — Assessment & Plan Note (Signed)
Continue nighttime CPAP

## 2022-02-05 NOTE — Progress Notes (Signed)
[facility] Progress Note Patient: Tonya Jenkins VVO:[id number] DOB: 07/14/1945 DOA: 02/04/2022  DOS: the patient was seen and examined on 02/05/2022  Brief hospital course: PMH of pancreatic cancer SP pancreatico duodenectomy and chemotherapy, failure to thrive, HTN, type II DM, OSA present to the hospital with complaints of shortness of breath found to have bilateral pleural effusion right more than left.  SP thoracentesis now. Assessment and Plan: Bilateral pleural effusion Mostly malignant in nature. No evidence of pneumonia or infection so far. Patient does have some pulmonary nodules. Prognosis performed. Monitor for reoccurrence and accumulation.   Essential hypertension Continue diltiazem   Pericardial effusion Small stable pericardial effusion seen on CTA chest.  No signs of any cardiac tamponade.   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  Recommend reconsulting PRN otherwise he will follow her outpatient   OSA on CPAP Continue nighttime CPAP  Subjective: No nausea no vomiting no fever no chills.  Reports fatigue and tiredness.  Continues to have shortness of breath.  Continues to have some cough.  Also reports orthopnea and PND.  Physical Exam: General: in moderate distress;  Cardiovascular: S1 and S2 Present, no Murmur Respiratory: Increased respiratory effort, Bilateral Air entry present, bilateral crackles, no wheezes Abdomen: Bowel Sound present, Non tender  Extremities: no edema Neurology: alert and oriented to time, place, and person   Data Reviewed: I have Reviewed nursing notes, Vitals, and Lab results. Since last encounter, pertinent lab results CBC and BMP   . I have  ordered test including CBC and BMP  .   Disposition: Status is: Observation SCDs Start: 02/05/22 0042   Family Communication: No one at bedside Level of care: Telemetry Continue telemetry.  Observe overnight to ensure there is no reaccumulation of the fluid. Vitals:   02/05/22 1311 02/05/22 1329 02/05/22 1445 02/05/22 1519  BP:  (!) 147/93  127/78  Pulse: 92 87 84 91  Resp: '17 19 18 18  '$ Temp:  98.3 F (36.8 C)  97.7 F (36.5 C)  TempSrc:  Oral  Oral  SpO2: 96% 96% 96% 96%  Weight:      Height:         Author: Berle Mull, MD 02/05/2022 5:49 PM  Please look on [email or web address] to find out who is on call.

## 2022-02-06 ENCOUNTER — Observation Stay (HOSPITAL_COMMUNITY): Payer: Medicare Other

## 2022-02-06 DIAGNOSIS — J90 Pleural effusion, not elsewhere classified: Secondary | ICD-10-CM | POA: Diagnosis not present

## 2022-02-06 LAB — TSH: TSH: 4.401 u[IU]/mL (ref 0.350–4.500)

## 2022-02-06 LAB — T4, FREE: Free T4: 1.11 ng/dL (ref 0.61–1.12)

## 2022-02-06 LAB — VITAMIN B12: Vitamin B-12: 2451 pg/mL — ABNORMAL HIGH (ref 180–914)

## 2022-02-06 MED ORDER — VITAMIN D (ERGOCALCIFEROL) 1.25 MG (50000 UNIT) PO CAPS
50000.0000 [IU] | ORAL_CAPSULE | ORAL | Status: DC
  Administered 2022-02-08: 50000 [IU] via ORAL
  Filled 2022-02-06: qty 1

## 2022-02-06 MED ORDER — MELATONIN 3 MG PO TABS
3.0000 mg | ORAL_TABLET | Freq: Once | ORAL | Status: AC
  Administered 2022-02-06: 3 mg via ORAL
  Filled 2022-02-06: qty 1

## 2022-02-06 MED ORDER — PROSOURCE PLUS PO LIQD
30.0000 mL | Freq: Two times a day (BID) | ORAL | Status: DC
  Administered 2022-02-06 – 2022-02-08 (×5): 30 mL via ORAL
  Filled 2022-02-06 (×5): qty 30

## 2022-02-06 MED ORDER — B COMPLEX-C PO TABS
1.0000 | ORAL_TABLET | Freq: Every day | ORAL | Status: DC
  Administered 2022-02-06 – 2022-02-08 (×3): 1 via ORAL
  Filled 2022-02-06 (×3): qty 1

## 2022-02-06 NOTE — Plan of Care (Signed)
Patient alert and oriented x 4. No reports of pain and SOB. X 1 assist the RW to the bathroom. Tele and pulse ox in placed. Refused CHG bath, will do later today. VS monitored. Safety and fall precautions observed. Call bell within reach.   Problem: Education: Goal: Knowledge of General Education information will improve Description: Including pain rating scale, medication(s)/side effects and non-pharmacologic comfort measures Outcome: Progressing   Problem: Clinical Measurements: Goal: Ability to maintain clinical measurements within normal limits will improve Outcome: Progressing Goal: Will remain free from infection Outcome: Progressing   Problem: Pain Managment: Goal: General experience of comfort will improve Outcome: Progressing   Problem: Safety: Goal: Ability to remain free from injury will improve Outcome: Progressing

## 2022-02-06 NOTE — Evaluation (Signed)
Physical Therapy Evaluation Patient Details Name: Tonya Jenkins MRN: [id number] DOB: 1946-01-22 Today's Date: 02/06/2022  History of Present Illness  76 yo female admitted with bil pleural effusions. Hx of pancreatic ca-was on chemo up until several weeks ago, s/p whipple 06/2021, FTT, DM, OSA  Clinical Impression  On eval, pt was Supv level for mobility. She walked ~10 feet with a rollator (distance was limited by NT/transporter waiting to take her to xray). Pt presents with general weakness and decreased activity tolerance. Discussed d/c plan-she plans to return home at discharge. She declines HHPT f/u. She reports she had recently begun aquatic therapy at [facility] continue to recommend OP PT for her to have at her leisure. Will plan to follow pt during hospital stay. Recommend ambulation with mobility team as well.        Recommendations for follow up therapy are one component of a multi-disciplinary discharge planning process, led by the attending physician.  Recommendations may be updated based on patient status, additional functional criteria and insurance authorization.  Follow Up Recommendations Outpatient PT (at patient's leisure. she mentioned she had recently started aquatic therapy at [facility])      Assistance Recommended at Discharge PRN  Patient can return home with the following  Assist for transportation;Assistance with cooking/housework;Help with stairs or ramp for entrance    Equipment Recommendations None recommended by PT  Recommendations for Other Services       Functional Status Assessment Patient has had a recent decline in their functional status and demonstrates the ability to make significant improvements in function in a reasonable and predictable amount of time.     Precautions / Restrictions Precautions Precautions: Fall Restrictions Weight Bearing Restrictions: No      Mobility  Bed Mobility Overal bed mobility: Modified Independent              General bed mobility comments: encouraged pt to do as much for herself as she can. she asked for assistance even though she did not need it    Transfers Overall transfer level: Needs assistance Equipment used: Rollator (4 wheels) Transfers: Sit to/from Stand Sit to Stand: Supervision           General transfer comment: Supv for safety, safe operation of rollator    Ambulation/Gait Ambulation/Gait assistance: Supervision Gait Distance (Feet): 10 Feet Assistive device: Rollator (4 wheels) Gait Pattern/deviations: Step-through pattern, Decreased stride length       General Gait Details: Supv for safety.  Stairs            Wheelchair Mobility    Modified Rankin (Stroke Patients Only)       Balance Overall balance assessment: Mild deficits observed, not formally tested                                           Pertinent Vitals/Pain Pain Assessment Pain Assessment: No/denies pain    Home Living Family/patient expects to be discharged to:: Private residence Living Arrangements: Alone Available Help at Discharge: Friend(s);Available PRN/intermittently Type of Home: House Home Access: Stairs to enter Entrance Stairs-Rails: Right;Left;Can reach both Entrance Stairs-Number of Steps: 5   Home Layout: One level Home Equipment: Rollator (4 wheels);Cane - single point;Grab bars - tub/shower;Hand held shower head;Grab bars - toilet Additional Comments: cares for her dog.    Prior Function Prior Level of Function : Independent/Modified Independent  Mobility Comments: ambulates with rollator and SPC interchangeably dependent on where she goes ADLs Comments: friend helps with meals as needed. pt bathes and dresses herself     Hand Dominance        Extremity/Trunk Assessment   Upper Extremity Assessment Upper Extremity Assessment: Defer to OT evaluation    Lower Extremity Assessment Lower Extremity Assessment:  Generalized weakness    Cervical / Trunk Assessment Cervical / Trunk Assessment: Kyphotic  Communication   Communication: No difficulties  Cognition Arousal/Alertness: Awake/alert Behavior During Therapy: WFL for tasks assessed/performed Overall Cognitive Status: Within Functional Limits for tasks assessed                                          General Comments      Exercises     Assessment/Plan    PT Assessment Patient needs continued PT services  PT Problem List Decreased strength;Decreased mobility;Decreased balance;Decreased activity tolerance       PT Treatment Interventions DME instruction;Therapeutic exercise;Gait training;Balance training;Functional mobility training;Therapeutic activities;Patient/family education;Stair training    PT Goals (Current goals can be found in the Care Plan section)  Acute Rehab PT Goals Patient Stated Goal: none stated PT Goal Formulation: With patient Time For Goal Achievement: 02/20/22 Potential to Achieve Goals: Good    Frequency Min 3X/week     Co-evaluation               AM-PAC PT "6 Clicks" Mobility  Outcome Measure Help needed turning from your back to your side while in a flat bed without using bedrails?: None Help needed moving from lying on your back to sitting on the side of a flat bed without using bedrails?: None Help needed moving to and from a bed to a chair (including a wheelchair)?: A Little Help needed standing up from a chair using your arms (e.g., wheelchair or bedside chair)?: None Help needed to walk in hospital room?: A Little Help needed climbing 3-5 steps with a railing? : A Little 6 Click Score: 21    End of Session Equipment Utilized During Treatment: Gait belt Activity Tolerance: Patient tolerated treatment well Patient left: in chair (with NT about to transport her to xray)   PT Visit Diagnosis: Unsteadiness on feet (R26.81);Muscle weakness (generalized) (M62.81)     Time: 5498-2641 PT Time Calculation (min) (ACUTE ONLY): 13 min   Charges:   PT Evaluation $PT Eval Low Complexity: [place], PT Acute Rehabilitation  Office: [phone]

## 2022-02-06 NOTE — Progress Notes (Signed)
OT Cancellation Note  Patient Details Name: Tonya Jenkins MRN: [id number] DOB: 01-08-1946   Cancelled Treatment:    Reason Eval/Treat Not Completed: Fatigue/lethargy limiting ability to participate. Patient declined due to just getting dressed and getting back from bathroom and fatigued. Will f/u as able.  Fayrene Towner L Shareena Nusz 02/06/2022, 2:43 PM

## 2022-02-06 NOTE — Progress Notes (Signed)
Pt refused to wear cpap tonight. She doesn't like how it feels. Machine remained bedside if pt changes her mind.

## 2022-02-06 NOTE — Progress Notes (Signed)
[facility] Progress Note Patient: Tonya Jenkins ZOX:[id number] DOB: [birth year] DOA: 02/04/2022  DOS: the patient was seen and examined on 02/06/2022  Brief hospital course: PMH of pancreatic cancer SP pancreatico duodenectomy and chemotherapy, failure to thrive, HTN, type II DM, OSA present to the hospital with complaints of shortness of breath found to have bilateral pleural effusion right more than left.  SP thoracentesis now. Assessment and Plan: Bilateral pleural effusion Acute hypoxic respiratory failure Bilateral pulmonary nodules. Mostly malignant in nature. No evidence of pneumonia or infection so far. Patient does have some pulmonary nodules. Thoracentesis on 12/2 yielded 1 L of yellow hazy fluid.  Meeting lights criteria for exudative effusion. No significant neutrophils. Repeat x-ray on 12/3 shows evidence of mild recommendation of the fluid.  Initially assessed to be around 2 L.  Oxygenation drops down to 88% on ambulation.  Patient does not use oxygen at her baseline. We will order another thoracentesis and monitor.   Pericardial effusion Small stable pericardial effusion seen on CTA chest.  No signs of any cardiac tamponade.  HTN. On Cardizem.  We will continue.   Pancreatic cancer [facility]) s/p pancreaticoduodenectomy and adjuvant chemotherapy later discontinued due to failure to thrive oncology is concerned about pancreaticoduodenectomy syndrome versus recurrent pancreatic cancer follows with Dr. Benay Spice with oncology who evaluated her in the ED.    OSA on CPAP Continue nighttime CPAP  Adult failure to thrive. Severe protein calorie malnutrition. Unintentional weight loss Initiating Prosource. Patient unable to tolerate protein shakes. Dietary consultation.  Subjective: No nausea no vomiting.  Reports some cough.  Reports fatigue and tiredness.  Limited specifically by breathing.  No abdominal pain.  Physical Exam: General: in no distress;  Cardiovascular:  S1 and S2 Present, no Murmur Respiratory: Increased respiratory effort, Bilateral Air entry present, right-sided crackles, no wheezes Abdomen: Bowel Sound present, Non tender  Extremities: Trace bilateral edema Neurology: alert and oriented to time, place, and person   Data Reviewed: I have Reviewed nursing notes, Vitals, and Lab results. Since last encounter, pertinent lab results CBC and BMP   . I have ordered test including CBC and BMP and chest x-ray  .   Disposition: Status is: Observation  SCDs Start: 02/05/22 0042   Family Communication: No one at bedside Level of care: Telemetry switch to MedSurg. Vitals:   02/05/22 2134 02/05/22 2341 02/06/22 0311 02/06/22 1242  BP: 135/74 122/71 129/70 (!) 143/88  Pulse:  90 80 86  Resp:  '16 16 18  '$ Temp:  98 F (36.7 C) 97.8 F (36.6 C) 98.1 F (36.7 C)  TempSrc:  Oral Oral Oral  SpO2:  95% 96% 95%  Weight:      Height:         Author: Berle Mull, MD 02/06/2022 5:00 PM  Please look on [email or web address] to find out who is on call.

## 2022-02-07 ENCOUNTER — Observation Stay (HOSPITAL_COMMUNITY): Payer: Medicare Other

## 2022-02-07 DIAGNOSIS — C250 Malignant neoplasm of head of pancreas: Secondary | ICD-10-CM

## 2022-02-07 DIAGNOSIS — R0602 Shortness of breath: Secondary | ICD-10-CM

## 2022-02-07 DIAGNOSIS — J90 Pleural effusion, not elsewhere classified: Secondary | ICD-10-CM

## 2022-02-07 DIAGNOSIS — Z9889 Other specified postprocedural states: Secondary | ICD-10-CM

## 2022-02-07 LAB — TRIGLYCERIDES, BODY FLUIDS: Triglycerides, Fluid: 20 mg/dL

## 2022-02-07 MED ORDER — LIDOCAINE HCL 1 % IJ SOLN
INTRAMUSCULAR | Status: AC
  Administered 2022-02-07: 10 mL
  Filled 2022-02-07: qty 20

## 2022-02-07 MED ORDER — PROSOURCE PLUS PO LIQD: 30.0000 mL | Freq: Three times a day (TID) | ORAL | 0 refills | Status: AC

## 2022-02-07 NOTE — Plan of Care (Signed)
Patient alert and oriented x 4. On and off pain underneath the left side of breast reported. Denies SOB. X 1 assist to the bathroom. C/o difficulty of sleeping, medicated per MAR. VS monitored. Safety and fall precautions observed.   Problem: Clinical Measurements: Goal: Ability to maintain clinical measurements within normal limits will improve Outcome: Progressing Goal: Will remain free from infection Outcome: Progressing   Problem: Pain Managment: Goal: General experience of comfort will improve Outcome: Progressing   Problem: Safety: Goal: Ability to remain free from injury will improve Outcome: Progressing   Problem: Skin Integrity: Goal: Risk for impaired skin integrity will decrease Outcome: Progressing

## 2022-02-07 NOTE — Progress Notes (Signed)
  Transition of Care [facility]) Screening Note   Patient Details  Name: Cornelius Schuitema Date of Birth: 02-10-1946   Transition of Care [facility]) CM/SW Contact:    Vassie Moselle, LCSW Phone Number: 02/07/2022, 11:06 AM    Transition of Care Department [facility]) has reviewed patient and no TOC needs have been identified at this time. We will continue to monitor patient advancement through interdisciplinary progression rounds. If new patient transition needs arise, please place a TOC consult.

## 2022-02-07 NOTE — Progress Notes (Signed)
IP PROGRESS NOTE  Subjective:   Tonya Jenkins reports feeling better after the thoracentesis on 02/05/2022.  She continues to have orthopnea.  She reports only 1 episode of diarrhea since hospital admission. Objective: Vital signs in last 24 hours: Blood pressure 124/68, pulse 79, temperature 98.3 F (36.8 C), temperature source Oral, resp. rate 15, height 5' (1.524 m), weight 101 lb (45.8 kg), SpO2 95 %.  Intake/Output from previous day: No intake/output data recorded.  Physical Exam:  HEENT: No thrush Lungs: Decreased breath sounds at the right lower posterior chest Cardiac: Regular rate and rhythm, the neck veins are mildly distended Abdomen: Distended, no hepatosplenomegaly, firm fullness at the upper abdomen to the right greater than left side of the midline scar Extremities: No leg edema   Portacath/PICC-without erythema  Lab Results: Recent Labs    02/04/22 1645 02/05/22 0515  WBC 13.2* 12.7*  HGB 12.5 12.2  HCT 37.7 36.3  PLT 365 378    BMET Recent Labs    02/04/22 1645  NA 133*  K 3.9  CL 99  CO2 21*  GLUCOSE 113*  BUN 23  CREATININE 0.98  CALCIUM 8.7*    Lab Results  Component Value Date   CEA 30.05 (H) 02/02/2022   [id number] <2 02/02/2022    Studies/Results: DG Chest 2 View  Result Date: 02/06/2022 CLINICAL DATA:  Bilateral pleural effusions. History of pancreatic cancer. EXAM: CHEST - 2 VIEW COMPARISON:  February 05, 2022 FINDINGS: The right Port-A-Cath is stable. Bilateral pleural effusions are identified, right greater than left. Opacity in the medial right lung base is stable. No pneumothorax. No nodules or masses. The cardiomediastinal silhouette is normal. IMPRESSION: 1. Bilateral pleural effusions, right greater than left. 2. Opacity in the medial right lung base could represent atelectasis or infiltrate. Recommend attention on follow-up. Electronically Signed   By: Dorise Bullion III M.D.   On: 02/06/2022 15:32   US THORACENTESIS ASP PLEURAL  SPACE W/IMG GUIDE  Result Date: 02/05/2022 INDICATION: Patient with history of pancreatic cancer, recurrent pleural effusion. Request for diagnostic and therapeutic right thoracentesis. EXAM: ULTRASOUND GUIDED RIGHT THORACENTESIS MEDICATIONS: 5 mL 1% lidocaine COMPLICATIONS: None immediate. PROCEDURE: An ultrasound guided thoracentesis was thoroughly discussed with the patient and questions answered. The benefits, risks, alternatives and complications were also discussed. The patient understands and wishes to proceed with the procedure. Written consent was obtained. Ultrasound was performed to localize and mark an adequate pocket of fluid in the right chest. The area was then prepped and draped in the normal sterile fashion. 1% Lidocaine was used for local anesthesia. Under ultrasound guidance a 6 Fr Safe-T-Centesis catheter was introduced. Thoracentesis was performed. The catheter was removed and a dressing applied. FINDINGS: A total of approximately 1.0 L of hazy yellow fluid was removed. Samples were sent to the laboratory as requested by the clinical team. IMPRESSION: Successful ultrasound guided right thoracentesis yielding 1.0 L of pleural fluid. Read by Candiss Norse, PA-C Electronically Signed   By: Albin Felling M.D.   On: 02/05/2022 13:42   DG CHEST PORT 1 VIEW  Result Date: 02/05/2022 CLINICAL DATA:  Status post thoracentesis EXAM: PORTABLE CHEST 1 VIEW COMPARISON:  Previous studies including chest radiographs and CT done on 02/04/2022 FINDINGS: There is interval decrease in amount of right pleural effusion after thoracentesis. There is no demonstrable pneumothorax. There are no signs of pulmonary edema. There are linear densities in right lower lung fields suggesting atelectasis/pneumonia. Tip of right IJ chest port is seen in superior vena  cava. There is previous surgical fusion in cervical spine. IMPRESSION: There is decrease in right pleural effusion. There is no pneumothorax.  Electronically Signed   By: Elmer Picker M.D.   On: 02/05/2022 13:37    Medications: I have reviewed the patient's current medications.  Assessment/Plan: Pancreas cancer, stage III (pT2pN2) CT abdomen/pelvis 05/12/2021-dilated intrahepatic and [place] bile ducts, abrupt narrowing of the common bile duct and pancreatic duct at the level of pancreas head, distended gallbladder MRI/MRCP 05/13/2018 23-3.6 x 2.1 x 4.2 cm enhancing mass in the head of the pancreas with obstruction of the pancreatic duct and common bile duct, no lymphadenopathy, no liver lesions Internal/external biliary drain 05/14/2021 CT chest 05/14/2021-small right pleural effusion, no evidence of metastatic disease Thoracentesis 05/16/2021-negative cytology Panc[facility]ectomy at [facility] 06/22/2021-no evidence of metastatic disease, inflamed gallbladder with percutaneous biliary drain, large pancreas head mass-gallbladder with acute cholecystitis and abscess formation negative for malignancy.  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, negative resection margins, 6/36 lymph nodes with metastatic adenocarcinoma, lymphovascular and perineural invasion CT angio chest and CT abdomen/pelvis 08/04/2021-negative for pulmonary embolism, status post Whipple procedure, 11 mm soft tissue nodule along the jejunal mesentery-nonspecific Cycle 1 gemcitabine/Abraxane 08/19/2021 Cycle 2 gemcitabine/Abraxane 09/02/2021 Cycle 3 gemcitabine/Abraxane 09/16/2021 Cycle 4 gemcitabine/Abraxane 09/30/2021 Cycle 5 gemcitabine/Abraxane 10/14/2021 Treatment held 10/28/2021 due to neutropenia, failure to thrive/poor performance status CTs 11/02/2021-stable abdominal retroperitoneal soft tissue thickening and central mesenteric lymph node; a few new tiny upper lobe pulmonary nodules; small to moderate pericardial effusion, increased; severe hepatic steatosis. Mild elevation of the CEA at [facility] 12/16/2021 CT chest 12/24/2021 (done to evaluate right  chest pain)-negative for acute pulmonary embolus; small right-sided pleural effusion; several ill-defined bilateral pulmonary nodules, indeterminate.  Hepatic steatosis.  Small circumferential pericardial effusion. Postoperative wound dehiscence-status post fascial closure and placement of a wound VAC 07/02/2021 Chronic numbness of the right lower leg and foot Hypertension Left leg edema-negative bilateral venous Doppler 08/19/2021 Diarrhea-likely secondary to pancreas insufficiency, Creon started 09/15/2021 Pericardial effusion on CT 11/02/2021; 2D echo 11/05/2021-small to moderate pericardial effusion surrounds heart, most prominent along lateral surface of RV.  Does not appear to be hemodynamically significant by echo criteria.  Repeat 2D echo 11/26/2021-trivial pericardial effusion present.  The pericardial effusion is anterior to the right ventricle. Presentation to the emergency room 02/04/2022 with dyspnea/orthopnea Chest x-ray with a large right pleural effusion Right thoracentesis 02/05/2022   Tonya Jenkins has a history of pancreas cancer.  He has experienced failure to thrive since undergoing the pancreaticoduodenectomy in April.  She has a poor performance status.  She presented 02/04/2022 with increased dyspnea.  She underwent a right thoracentesis 02/05/2022 and the dyspnea is partially improved.  The effusion is exudative.  I suspect she has a malignant effusion.  The pleural fluid cytology is pending.  I agree with a repeat therapeutic thoracentesis.  We can consider Pleurx catheter placement if the pleural fluid reaccumulate's rapidly.  She may be stable for discharge to home following the thoracentesis.   Recommendations:  Repeat therapeutic thoracentesis Continue Creon 3.   Follow-up pleural fluid cytology 4.   Outpatient follow-up is scheduled at the Cancer center for 02/17/2022  LOS: 0 days   Betsy Coder, MD   02/07/2022, 7:27 AM

## 2022-02-07 NOTE — Procedures (Signed)
PROCEDURE SUMMARY:  Successful US guided left thoracentesis. Yielded 100cc of pleural fluid. Pt tolerated procedure well. No immediate complications.  Specimen was sent for labs. CXR ordered.  EBL < 5 mL  Dmetrius Ambs PA-C 02/07/2022 3:27 PM

## 2022-02-07 NOTE — Progress Notes (Signed)
[facility] Progress Note Patient: Tonya Jenkins ZOX:[id number] DOB: 03-Dec-1945 DOA: 02/04/2022  DOS: the patient was seen and examined on 02/07/2022  Brief hospital course: PMH of pancreatic cancer SP pancreatico duodenectomy and chemotherapy, failure to thrive, HTN, type II DM, OSA present to the hospital with complaints of shortness of breath found to have bilateral pleural effusion right more than left.  SP thoracentesis now. Assessment and Plan: Bilateral pleural effusion Acute hypoxic respiratory failure Bilateral pulmonary nodules. Mostly malignant in nature. No evidence of pneumonia or infection so far. Patient does have some pulmonary nodules. Thoracentesis on 12/2 yielded 1 L of yellow hazy fluid.  Meeting lights criteria for exudative effusion. No significant neutrophils. Underwent thoracentesis of the left side on 12/4 instead of repeat thoracentesis of the right side. Unable to ambulate due to severe fatigue today and continues to have shortness of breath and ambulation. Monitor overnight for stability.  Goals of care conversation. I had extensive conversation with patient with regards to goals of care. She is afraid of leaving the hospital and feels safe being in the hospital as she has access to someone if she needs any emergency help. She is severely weak and unable to ambulate beyond the door of the room without being fatigued and tired.  Multiple times mid conversation she had to lay down in the bed and carry out conversation. She is afraid of having a fall and therefore feels safe in the hospital. I explained that her weakness will not improve in 12 hours.  Nor her risk for fall will reduce tomorrow.  I explained that she has progressive failure to thrive which is responsible for her generalized weakness as well as pleural effusions and pericardial effusions. There is also a risk that the pancreatic malignancy has recurrence. I recommended that the ideal management of  her pleural effusion if it reoccurs would be a Pleurx catheter.  But that has nothing to do with her generalized weakness. Patient is not interested in SNF or home health. Patient thinks that that expectation was that there will not be any major complication after Whipple's procedure and that should take care of the cancer.  And thus her failure to thrive as a result of poor prognosis appears to be a shock to her. Current caregiver, friend who lives with her at bedside has worked with [employer] in the past. I explain palliative care and various aspect of palliative care.  I recommended that at a minimum she will benefit from having referral to palliative care for care connection and ideally would do well on hospice. Patient currently agreeable for palliative care referral. Patient was discussed with caregiver with regards to option for home hospice. I suspect that going home on home hospice would be the optimal option for patient and it would be worthwhile to observe her overnight to ensure that the goal of care is clearer.    Pericardial effusion Small stable pericardial effusion seen on CTA chest.  No signs of any cardiac tamponade.   HTN. On Cardizem.  We will continue.   Pancreatic cancer [facility]) s/p pancreaticoduodenectomy and adjuvant chemotherapy later discontinued due to failure to thrive oncology is concerned about pancreaticoduodenectomy syndrome versus recurrent pancreatic cancer follows with Dr. Benay Spice with oncology who evaluated her in the ED.    OSA on CPAP Continue nighttime CPAP   Adult failure to thrive. Severe protein calorie malnutrition. Unintentional weight loss Initiating Prosource. Patient unable to tolerate protein shakes. Dietary consultation.   Subjective: No nausea no vomiting.  No fever no chills.  Does not want to go home today.  Wants to go home tomorrow.  She is afraid to fall.  She is afraid of something happening to her.  She feels her breathing is  better compared to yesterday.  Physical Exam: General: in mild distress;  Cardiovascular: S1 and S2 Present, no Murmur Respiratory: normal respiratory effort, Bilateral Air entry present, bilateral basal Crackles, no wheezes Abdomen: Bowel Sound present, Non tender  Extremities: no edema Neurology: alert and oriented to time, place, and person   Data Reviewed: I have Reviewed nursing notes, Vitals, and Lab results. Since last encounter, pertinent lab results chest x-ray   . I have ordered test including CBC and BMP. I have discussed pt's care plan and test results with IR and oncology.   Disposition: Status is: Observation Observation is still appropriate.  SCDs Start: 02/05/22 0042   Family Communication: Family at bedside. Level of care: Med-Surg  Vitals:   02/07/22 0603 02/07/22 1110 02/07/22 1135 02/07/22 1437  BP: 124/68 129/76 122/74 131/77  Pulse: 79   87  Resp: 15     Temp: 98.3 F (36.8 C)   97.7 F (36.5 C)  TempSrc: Oral   Oral  SpO2: 95%   97%  Weight:      Height:         Author: Berle Mull, MD 02/07/2022 7:23 PM  Please look on [email or web address] to find out who is on call.

## 2022-02-07 NOTE — Evaluation (Signed)
Occupational Therapy Evaluation Patient Details Name: Tonya Jenkins MRN: [id number] DOB: [birth year] Today's Date: 02/07/2022   History of Present Illness Patient is a 76 year old female admitted with bilateral pleural effusions. Hx of pancreatic ca-was on chemo up until several weeks ago, s/p whipple 06/2021, FTT, DM, OSA   Clinical Impression   Patient is a 76 year old female who was admitted for above. Patient was living at home at rollator level with friend support daily. Patient was noted to need supervision for ADL tasks with patient quick to fatigue. ECT were reviewed but patient would benefit from more ECT strategies.patient was noted to have decreased functional activity tolerance and endurance effecting participation in ADLs and desired tasks at home. Patient would continue to benefit from skilled OT services at this time while admitted to address noted deficits in order to improve overall safety and independence in ADLs.        Recommendations for follow up therapy are one component of a multi-disciplinary discharge planning process, led by the attending physician.  Recommendations may be updated based on patient status, additional functional criteria and insurance authorization.   Follow Up Recommendations  No OT follow up     Assistance Recommended at Discharge Frequent or constant Supervision/Assistance  Patient can return home with the following Assistance with cooking/housework;Direct supervision/assist for medications management;Assist for transportation;Help with stairs or ramp for entrance;Direct supervision/assist for financial management;A little help with bathing/dressing/bathroom    Functional Status Assessment  Patient has had a recent decline in their functional status and demonstrates the ability to make significant improvements in function in a reasonable and predictable amount of time.  Equipment Recommendations  None recommended by OT    Recommendations for Other  Services       Precautions / Restrictions Precautions Precautions: Fall Restrictions Weight Bearing Restrictions: No      Mobility Bed Mobility Overal bed mobility: Modified Independent             General bed mobility comments: patient was able to complete mobility himself    Transfers                          Balance Overall balance assessment: Mild deficits observed, not formally tested                                         ADL either performed or assessed with clinical judgement   ADL Overall ADL's : Needs assistance/impaired Eating/Feeding: Modified independent;Sitting   Grooming: Wash/dry hands;Set up;Standing Grooming Details (indicate cue type and reason): at sink in bathroom no LOB Upper Body Bathing: Set up;Sitting   Lower Body Bathing: Set up;Sitting/lateral leans Lower Body Bathing Details (indicate cue type and reason): decreased activity tolerance. Upper Body Dressing : Set up;Sitting   Lower Body Dressing: Set up;Sitting/lateral leans   Toilet Transfer: Supervision/safety;Rolling walker (2 wheels);Ambulation Toilet Transfer Details (indicate cue type and reason): with increased time able to manage door and light with MI Toileting- Clothing Manipulation and Hygiene: Supervision/safety;Sit to/from stand       Functional mobility during ADLs: Supervision/safety General ADL Comments: Patient was educated on ECT and assisted with problem solving strategies for home ECT during the day. patient was educated on various strategies to complete cooking tasks while saving energy. patient verbalized understanding. patient reported sometimes she can speak with family about feelings. patient was  educated on chaplin services and importance of venting and benefits of someone who she does not know personally. patient verbalized understanding and reported not at this time. nurse made aware.     Vision Patient Visual Report: No change  from baseline       Perception     Praxis      Pertinent Vitals/Pain Pain Assessment Pain Assessment: No/denies pain     Hand Dominance Right   Extremity/Trunk Assessment Upper Extremity Assessment Upper Extremity Assessment: Overall WFL for tasks assessed;Generalized weakness   Lower Extremity Assessment Lower Extremity Assessment: Defer to PT evaluation   Cervical / Trunk Assessment Cervical / Trunk Assessment: Kyphotic   Communication Communication Communication: No difficulties   Cognition Arousal/Alertness: Awake/alert Behavior During Therapy: WFL for tasks assessed/performed Overall Cognitive Status: Within Functional Limits for tasks assessed                                       General Comments       Exercises     Shoulder Instructions      Home Living Family/patient expects to be discharged to:: Private residence Living Arrangements: Alone Available Help at Discharge: Friend(s);Available PRN/intermittently Type of Home: House Home Access: Stairs to enter [employer] of Steps: 5 Entrance Stairs-Rails: Right;Left;Can reach both Home Layout: One level     Bathroom Shower/Tub: Occupational psychologist: Handicapped height     Home Equipment: Rollator (4 wheels);Cane - single point;Grab bars - tub/shower;Hand held shower head;Grab bars - toilet          Prior Functioning/Environment Prior Level of Function : Independent/Modified Independent             Mobility Comments: ambulates with rollator and SPC interchangeably dependent on where she goes ADLs Comments: friend helps with meals as needed. pt bathes and dresses herself        OT Problem List: Decreased activity tolerance;Decreased knowledge of use of DME or AE;Decreased knowledge of precautions      OT Treatment/Interventions: Self-care/ADL training;Energy conservation;Therapeutic exercise;DME and/or AE instruction;Therapeutic  activities;Patient/family education    OT Goals(Current goals can be found in the care plan section) Acute Rehab OT Goals Patient Stated Goal: to get more energy OT Goal Formulation: With patient Time For Goal Achievement: 02/21/22 Potential to Achieve Goals: Fair  OT Frequency: Min 2X/week    Co-evaluation              AM-PAC OT "6 Clicks" Daily Activity     Outcome Measure Help from another person eating meals?: None Help from another person taking care of personal grooming?: A Little Help from another person toileting, which includes using toliet, bedpan, or urinal?: A Little Help from another person bathing (including washing, rinsing, drying)?: A Little Help from another person to put on and taking off regular upper body clothing?: A Little Help from another person to put on and taking off regular lower body clothing?: A Little 6 Click Score: 19   End of Session Equipment Utilized During Treatment: Gait belt;Rolling walker (2 wheels) Nurse Communication: Other (comment) (patient contimplating paging chaplin services.)  Activity Tolerance: Patient tolerated treatment well Patient left: in bed;with call bell/phone within reach;with nursing/sitter in room  OT Visit Diagnosis: Unsteadiness on feet (R26.81);Other abnormalities of gait and mobility (R26.89)                Time: 4888-9169 OT Time  Calculation (min): 24 min Charges:  OT General Charges $OT Visit: 1 Visit OT Evaluation $OT Eval Low Complexity: 1 Low OT Treatments $Self Care/Home Management : 8-22 mins  Tonya Plowman, MS Acute Rehabilitation Department Office# [phone]   Willa Rough 02/07/2022, 10:16 AM

## 2022-02-08 ENCOUNTER — Telehealth: Payer: Self-pay | Admitting: Internal Medicine

## 2022-02-08 DIAGNOSIS — J90 Pleural effusion, not elsewhere classified: Secondary | ICD-10-CM | POA: Diagnosis not present

## 2022-02-08 LAB — CYTOLOGY - NON PAP

## 2022-02-08 MED ORDER — LOPERAMIDE HCL 2 MG PO TABS: 2.0000 mg | ORAL_TABLET | Freq: Four times a day (QID) | ORAL | 0 refills | Status: AC | PRN

## 2022-02-08 MED ORDER — HEPARIN SOD (PORK) LOCK FLUSH 100 UNIT/ML IV SOLN
500.0000 [IU] | INTRAVENOUS | Status: AC | PRN
  Administered 2022-02-08: 500 [IU]

## 2022-02-08 MED ORDER — MIRTAZAPINE 7.5 MG PO TABS: 7.5000 mg | ORAL_TABLET | Freq: Every day | ORAL | 0 refills | Status: AC

## 2022-02-08 NOTE — Plan of Care (Signed)
Patient is stable and is being discharged to her home. Port access is being removed by IV team.

## 2022-02-08 NOTE — Progress Notes (Signed)
IP PROGRESS NOTE  Subjective:   Tonya Jenkins reports her dyspnea remains improved.  She would like to go home. Objective: Vital signs in last 24 hours: Blood pressure 119/67, pulse 79, temperature (!) 97.5 F (36.4 C), temperature source Oral, resp. rate 16, height 5' (1.524 m), weight 101 lb (45.8 kg), SpO2 94 %.  Intake/Output from previous day: No intake/output data recorded.  Physical Exam:  HEENT: No thrush Lungs: Decreased breath sounds at the right lower posterior chest Cardiac: Regular rate and rhythm Abdomen: Mildly distended, no hepatosplenomegaly, firm fullness at the upper abdomen to the right greater than left side of the midline scar Extremities: No leg edema   Portacath/PICC-without erythema  Lab Results: No results for input(s): "WBC", "HGB", "HCT", "PLT" in the last 72 hours.   BMET No results for input(s): "NA", "K", "CL", "CO2", "GLUCOSE", "BUN", "CREATININE", "CALCIUM" in the last 72 hours.   Lab Results  Component Value Date   CEA 30.05 (H) 02/02/2022   [id number] <2 02/02/2022    Studies/Results: US THORACENTESIS ASP PLEURAL SPACE W/IMG GUIDE  Result Date: 02/07/2022 INDICATION: Pleural effusion; diagnostic tests ordered EXAM: ULTRASOUND GUIDED LEFT THORACENTESIS MEDICATIONS: None. COMPLICATIONS: None immediate. PROCEDURE: An ultrasound guided thoracentesis was thoroughly discussed with the patient and questions answered. The benefits, risks, alternatives and complications were also discussed. The patient understands and wishes to proceed with the procedure. Written consent was obtained. Ultrasound was performed to localize and mark a small pocket of fluid in the left chest. The area was then prepped and draped in the normal sterile fashion. 1% Lidocaine was used for local anesthesia. Under ultrasound guidance a 6 Fr Safe-T-Centesis catheter was introduced. Thoracentesis was performed. The catheter was removed and a dressing applied. FINDINGS: A total of  approximately 100cc of pleural fluid was removed. Samples were sent to the laboratory as requested by the clinical team. IMPRESSION: Successful ultrasound guided left thoracentesis yielding 100cc of pleural fluid. Read and performed by: Alexandria Lodge, PA-C Electronically Signed   By: Ruthann Cancer M.D.   On: 02/07/2022 15:39   DG CHEST PORT 1 VIEW  Result Date: 02/07/2022 CLINICAL DATA:  Status post thoracentesis EXAM: PORTABLE CHEST 1 VIEW COMPARISON:  02/06/2022 FINDINGS: No significant change in AP portable chest radiograph. Unchanged, moderate, loculated appearing right pleural effusion as well as a small left pleural effusion. No pneumothorax status post thoracentesis. Right chest port catheter. Cardiac borders partially obscured by effusions but unremarkable. Osseous structures unremarkable. IMPRESSION: No significant change in AP portable chest radiograph. Unchanged, moderate, loculated appearing right pleural effusion as well as a small left pleural effusion. No pneumothorax status post thoracentesis. Electronically Signed   By: Delanna Ahmadi M.D.   On: 02/07/2022 11:55   DG Chest 2 View  Result Date: 02/06/2022 CLINICAL DATA:  Bilateral pleural effusions. History of pancreatic cancer. EXAM: CHEST - 2 VIEW COMPARISON:  February 05, 2022 FINDINGS: The right Port-A-Cath is stable. Bilateral pleural effusions are identified, right greater than left. Opacity in the medial right lung base is stable. No pneumothorax. No nodules or masses. The cardiomediastinal silhouette is normal. IMPRESSION: 1. Bilateral pleural effusions, right greater than left. 2. Opacity in the medial right lung base could represent atelectasis or infiltrate. Recommend attention on follow-up. Electronically Signed   By: Dorise Bullion III M.D.   On: 02/06/2022 15:32    Medications: I have reviewed the patient's current medications.  Assessment/Plan: Pancreas cancer, stage III (pT2pN2) CT abdomen/pelvis 05/12/2021-dilated  intrahepatic and [place] bile ducts, abrupt  narrowing of the common bile duct and pancreatic duct at the level of pancreas head, distended gallbladder MRI/MRCP 05/13/2018 23-3.6 x 2.1 x 4.2 cm enhancing mass in the head of the pancreas with obstruction of the pancreatic duct and common bile duct, no lymphadenopathy, no liver lesions Internal/external biliary drain 05/14/2021 CT chest 05/14/2021-small right pleural effusion, no evidence of metastatic disease Thoracentesis 05/16/2021-negative cytology Pancreaticoduodenectomy at [facility] 06/22/2021-no evidence of metastatic disease, inflamed gallbladder with percutaneous biliary drain, large pancreas head mass-gallbladder with acute cholecystitis and abscess formation negative for malignancy.  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, negative resection margins, 6/36 lymph nodes with metastatic adenocarcinoma, lymphovascular and perineural invasion CT angio chest and CT abdomen/pelvis 08/04/2021-negative for pulmonary embolism, status post Whipple procedure, 11 mm soft tissue nodule along the jejunal mesentery-nonspecific Cycle 1 gemcitabine/Abraxane 08/19/2021 Cycle 2 gemcitabine/Abraxane 09/02/2021 Cycle 3 gemcitabine/Abraxane 09/16/2021 Cycle 4 gemcitabine/Abraxane 09/30/2021 Cycle 5 gemcitabine/Abraxane 10/14/2021 Treatment held 10/28/2021 due to neutropenia, failure to thrive/poor performance status CTs 11/02/2021-stable abdominal retroperitoneal soft tissue thickening and central mesenteric lymph node; a few new tiny upper lobe pulmonary nodules; small to moderate pericardial effusion, increased; severe hepatic steatosis. Mild elevation of the CEA at [facility] 12/16/2021 CT chest 12/24/2021 (done to evaluate right chest pain)-negative for acute pulmonary embolus; small right-sided pleural effusion; several ill-defined bilateral pulmonary nodules, indeterminate.  Hepatic steatosis.  Small circumferential pericardial effusion. Postoperative wound  dehiscence-status post fascial closure and placement of a wound VAC 07/02/2021 Chronic numbness of the right lower leg and foot Hypertension Left leg edema-negative bilateral venous Doppler 08/19/2021 Diarrhea-likely secondary to pancreas insufficiency, Creon started 09/15/2021 Pericardial effusion on CT 11/02/2021; 2D echo 11/05/2021-small to moderate pericardial effusion surrounds heart, most prominent along lateral surface of RV.  Does not appear to be hemodynamically significant by echo criteria.  Repeat 2D echo 11/26/2021-trivial pericardial effusion present.  The pericardial effusion is anterior to the right ventricle. Presentation to the emergency room 02/04/2022 with dyspnea/orthopnea Chest x-ray with a large right pleural effusion Right thoracentesis 02/05/2022 Left thoracentesis 02/07/2022   Tonya Jenkins has a history of pancreas cancer.  She has experienced failure to thrive since undergoing the pancreaticoduodenectomy in April.  She has a poor performance status.  She presented 02/04/2022 with increased dyspnea.  She underwent a right thoracentesis 02/05/2022 and a left thoracentesis 02/07/2022.  The dyspnea is improved.   I suspect she has a malignant effusion.  The pleural fluid cytology is pending.  I discussed the prognosis and disposition plans with Tonya Jenkins.  She would like to go home.  The plan is to enroll in home hospice care if the pleural fluid cytology is positive.  She would like to be placed on a no CODE BLUE status.   Recommendations:  No CODE BLUE Continue Creon 3.   Follow-up pleural fluid cytology 4.   Outpatient follow-up is scheduled at the Cancer center for 02/17/2022, chest x-ray 02/17/2022 5.   [facility] consult for home hospice care if the pleural fluid cytology is positive  LOS: 0 days   Betsy Coder, MD   02/08/2022, 1:48 PM

## 2022-02-08 NOTE — Discharge Summary (Signed)
Physician Discharge Summary   Patient: Tonya Jenkins MRN: [id number] DOB: [birth year]  Admit date:     02/04/2022  Discharge date: 02/08/2022  Discharge Physician: Berle Mull  PCP: [name], MD  Recommendations at discharge:  Follow up with PCP and Dr Benay Spice as recommended   Follow-up Information     Hagler, Apolonio Schneiders, MD. Schedule an appointment as soon as possible for a visit in 1 week(s).   Specialty: Family Medicine Contact information: [place] [address] [phone]                Discharge Diagnoses: Principal Problem:   Pleural effusion Active Problems:   Essential hypertension   OSA on CPAP   Pancreatic cancer ([place])   Pericardial effusion  Hospital Course: PMH of pancreatic cancer SP pancreatico duodenectomy and chemotherapy, failure to thrive, HTN, type II DM, OSA present to the hospital with complaints of shortness of breath found to have bilateral pleural effusion right more than left.  SP thoracentesis now. Assessment and Plan  Bilateral pleural effusion Acute hypoxic respiratory failure Bilateral pulmonary nodules. Mostly malignant in nature. No evidence of pneumonia or infection so far. Patient does have some pulmonary nodules. Thoracentesis on 12/2 yielded 1 L of yellow hazy fluid.  Meeting lights criteria for exudative effusion. No significant neutrophils. Underwent thoracentesis of the left side on 12/4 instead of repeat thoracentesis of the right side.   Goals of care conversation. I had extensive conversation with patient with regards to goals of care. She is afraid of leaving the hospital and feels safe being in the hospital as she has access to someone if she needs any emergency help. She is severely weak and unable to ambulate beyond the door of the room without being fatigued and tired.  Multiple times mid conversation she had to lay down in the bed and carry out conversation. She is afraid of having a fall and  therefore feels safe in the hospital. I explained that her weakness will not improve in 12 hours.  Nor her risk for fall will reduce tomorrow.  I explained that she has progressive failure to thrive which is responsible for her generalized weakness as well as pleural effusions and pericardial effusions. There is also a risk that the pancreatic malignancy has recurrence. I recommended that the ideal management of her pleural effusion if it reoccurs would be a Pleurx catheter.  But that has nothing to do with her generalized weakness. Patient is not interested in SNF or home health. Patient thinks that that expectation was that there will not be any major complication after Whipple's procedure and that should take care of the cancer.  And thus her failure to thrive as a result of poor prognosis appears to be a shock to her. Current caregiver, friend who lives with her at bedside has worked with [employer] in the past. I explain palliative care and various aspect of palliative care.  I recommended that at a minimum she will benefit from having referral to palliative care for care connection and ideally would do well on hospice. Patient currently agreeable for palliative care referral. Patient was discussed with caregiver with regards to option for home hospice. Plan is to go home for now with Palliative care referral.    Pericardial effusion Small stable pericardial effusion seen on CTA chest.  No signs of any cardiac tamponade.   HTN. On Cardizem.  We will continue.   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 Benay Spice with oncology who evaluated her in the ED.    OSA on CPAP Continue nighttime CPAP   Adult failure to thrive. Severe protein calorie malnutrition. Unintentional weight loss Initiating  Prosource. Patient unable to tolerate protein shakes. Dietary consultation.   Sleep issues  Recommended to add remeron to the regimen  Consultants:  Oncology   Procedures performed:  Thoracentesis  DISCHARGE MEDICATION: Allergies as of 02/08/2022       Reactions   Amlodipine Besylate Other (See Comments)   Severe headaches   Hydrochlorothiazide Other (See Comments)   Hallucinations   Lexapro [escitalopram Oxalate]    Pt stated, "It gave me hallucinations"   Oxycodone Other (See Comments)   "I go crazy"   Penicillins Other (See Comments)   Reaction not recalled    Prednisone Other (See Comments)   Past issue with this        Medication List     STOP taking these medications    predniSONE 10 MG tablet Commonly known as: DELTASONE       TAKE these medications    (feeding supplement) PROSource Plus liquid Take 30 mLs by mouth 3 (three) times daily between meals.   Creon 36000 UNITS Cpep capsule Generic drug: lipase/protease/amylase Take 1 capsule (36,000 Units total) by mouth 3 (three) times daily with meals. May also take 1 capsule (36,000 Units total) as needed (with snacks). What changed: See the new instructions.   diltiazem 180 MG 24 hr capsule Commonly known as: DILACOR XR Take 180 mg by mouth at bedtime.   hydrOXYzine 25 MG tablet Commonly known as: ATARAX Take 1 tablet (25 mg total) by mouth at bedtime as needed (for sleep).   lidocaine-prilocaine cream Commonly known as: EMLA Apply 1 Application topically as directed. Apply 1/2 tablespoon to port site and cover with Press-and-Seal 2 hours prior to stick to numb site What changed:  when to take this additional instructions   loperamide 2 MG tablet Commonly known as: Imodium A-D Take 1 tablet (2 mg total) by mouth 4 (four) times daily as needed for diarrhea or loose stools.   LORazepam 0.5 MG tablet Commonly known as: ATIVAN Take 0.5-1 tablets (0.25-0.5 mg total) by mouth every 8 (eight)  hours as needed (nausea).   MELATONIN PO Take 6 drops by mouth at bedtime.   mirtazapine 7.5 MG tablet Commonly known as: REMERON Take 1 tablet (7.5 mg total) by mouth at bedtime.   ondansetron 8 MG tablet Commonly known as: ZOFRAN Take 8 mg by mouth every 8 (eight) hours as needed for nausea or vomiting.   potassium chloride 10 MEQ CR capsule Commonly known as: MICRO-K Take 1 capsule (10 mEq total) by mouth 2 (two) times daily.   PROBIOTIC PO Take 2 capsules by mouth daily.   prochlorperazine 10 MG tablet Commonly known as: COMPAZINE Take 1 tablet (10 mg total) by mouth every 6 (six) hours as needed for nausea or vomiting.   Tylenol 325 MG tablet Generic drug: acetaminophen Take 325 mg by mouth every 6 (six) hours as needed for mild pain or headache.   VITAMIN B COMPLEX PO Take 1 capsule by mouth daily.   Vitamin D (Ergocalciferol) 1.25 MG (50000 UNIT) Caps capsule Commonly known as: DRISDOL Take 50,000 Units by mouth every Tuesday.       Disposition: Home Diet recommendation: Regular diet  Discharge Exam: Vitals:   02/07/22 1135  02/07/22 1437 02/07/22 1954 02/08/22 0436  BP: 122/74 131/77 (!) 117/93 119/67  Pulse:  87 89 79  Resp:   16 16  Temp:  97.7 F (36.5 C) 97.9 F (36.6 C) (!) 97.5 F (36.4 C)  TempSrc:  Oral Oral Oral  SpO2:  97% 95% 94%  Weight:      Height:       General: Appear in mild distress; no visible Abnormal Neck Mass Or lumps, Conjunctiva normal Cardiovascular: S1 and S2 Present, no Murmur, Respiratory: good respiratory effort, Bilateral Air entry present and faint right Crackles, no wheezes Abdomen: Bowel Sound present, Non tender  Extremities: no Pedal edema Neurology: alert and oriented to time, place, and person  Filed Weights   02/04/22 1421  Weight: 45.8 kg   Condition at discharge: stable  The results of significant diagnostics from this hospitalization (including imaging, microbiology, ancillary and laboratory) are  listed below for reference.   Imaging Studies: US THORACENTESIS ASP PLEURAL SPACE W/IMG GUIDE  Result Date: 02/07/2022 INDICATION: Pleural effusion; diagnostic tests ordered EXAM: ULTRASOUND GUIDED LEFT THORACENTESIS MEDICATIONS: None. COMPLICATIONS: None immediate. PROCEDURE: An ultrasound guided thoracentesis was thoroughly discussed with the patient and questions answered. The benefits, risks, alternatives and complications were also discussed. The patient understands and wishes to proceed with the procedure. Written consent was obtained. Ultrasound was performed to localize and mark a small pocket of fluid in the left chest. The area was then prepped and draped in the normal sterile fashion. 1% Lidocaine was used for local anesthesia. Under ultrasound guidance a 6 Fr Safe-T-Centesis catheter was introduced. Thoracentesis was performed. The catheter was removed and a dressing applied. FINDINGS: A total of approximately 100cc of pleural fluid was removed. Samples were sent to the laboratory as requested by the clinical team. IMPRESSION: Successful ultrasound guided left thoracentesis yielding 100cc of pleural fluid. Read and performed by: Alexandria Lodge, PA-C Electronically Signed   By: Ruthann Cancer M.D.   On: 02/07/2022 15:39   DG CHEST PORT 1 VIEW  Result Date: 02/07/2022 CLINICAL DATA:  Status post thoracentesis EXAM: PORTABLE CHEST 1 VIEW COMPARISON:  02/06/2022 FINDINGS: No significant change in AP portable chest radiograph. Unchanged, moderate, loculated appearing right pleural effusion as well as a small left pleural effusion. No pneumothorax status post thoracentesis. Right chest port catheter. Cardiac borders partially obscured by effusions but unremarkable. Osseous structures unremarkable. IMPRESSION: No significant change in AP portable chest radiograph. Unchanged, moderate, loculated appearing right pleural effusion as well as a small left pleural effusion. No pneumothorax status post  thoracentesis. Electronically Signed   By: Delanna Ahmadi M.D.   On: 02/07/2022 11:55   DG Chest 2 View  Result Date: 02/06/2022 CLINICAL DATA:  Bilateral pleural effusions. History of pancreatic cancer. EXAM: CHEST - 2 VIEW COMPARISON:  February 05, 2022 FINDINGS: The right Port-A-Cath is stable. Bilateral pleural effusions are identified, right greater than left. Opacity in the medial right lung base is stable. No pneumothorax. No nodules or masses. The cardiomediastinal silhouette is normal. IMPRESSION: 1. Bilateral pleural effusions, right greater than left. 2. Opacity in the medial right lung base could represent atelectasis or infiltrate. Recommend attention on follow-up. Electronically Signed   By: Dorise Bullion III M.D.   On: 02/06/2022 15:32   US THORACENTESIS ASP PLEURAL SPACE W/IMG GUIDE  Result Date: 02/05/2022 INDICATION: Patient with history of pancreatic cancer, recurrent pleural effusion. Request for diagnostic and therapeutic right thoracentesis. EXAM: ULTRASOUND GUIDED RIGHT THORACENTESIS MEDICATIONS: 5 mL 1% lidocaine COMPLICATIONS:  None immediate. PROCEDURE: An ultrasound guided thoracentesis was thoroughly discussed with the patient and questions answered. The benefits, risks, alternatives and complications were also discussed. The patient understands and wishes to proceed with the procedure. Written consent was obtained. Ultrasound was performed to localize and mark an adequate pocket of fluid in the right chest. The area was then prepped and draped in the normal sterile fashion. 1% Lidocaine was used for local anesthesia. Under ultrasound guidance a 6 Fr Safe-T-Centesis catheter was introduced. Thoracentesis was performed. The catheter was removed and a dressing applied. FINDINGS: A total of approximately 1.0 L of hazy yellow fluid was removed. Samples were sent to the laboratory as requested by the clinical team. IMPRESSION: Successful ultrasound guided right thoracentesis yielding  1.0 L of pleural fluid. Read by Candiss Norse, PA-C Electronically Signed   By: Albin Felling M.D.   On: 02/05/2022 13:42   DG CHEST PORT 1 VIEW  Result Date: 02/05/2022 CLINICAL DATA:  Status post thoracentesis EXAM: PORTABLE CHEST 1 VIEW COMPARISON:  Previous studies including chest radiographs and CT done on 02/04/2022 FINDINGS: There is interval decrease in amount of right pleural effusion after thoracentesis. There is no demonstrable pneumothorax. There are no signs of pulmonary edema. There are linear densities in right lower lung fields suggesting atelectasis/pneumonia. Tip of right IJ chest port is seen in superior vena cava. There is previous surgical fusion in cervical spine. IMPRESSION: There is decrease in right pleural effusion. There is no pneumothorax. Electronically Signed   By: Elmer Picker M.D.   On: 02/05/2022 13:37   CT Angio Chest PE W/Cm &/Or Wo Cm  Result Date: 02/04/2022 CLINICAL DATA:  Increasing shortness of breath for 2 days, orthopnea, history of pancreatic cancer EXAM: CT ANGIOGRAPHY CHEST WITH CONTRAST TECHNIQUE: Multidetector CT imaging of the chest was performed using the standard protocol during bolus administration of intravenous contrast. Multiplanar CT image reconstructions and MIPs were obtained to evaluate the vascular anatomy. RADIATION DOSE REDUCTION: This exam was performed according to the departmental dose-optimization program which includes automated exposure control, adjustment of the mA and/or kV according to patient size and/or use of iterative reconstruction technique. CONTRAST:  60m OMNIPAQUE IOHEXOL 350 MG/ML SOLN COMPARISON:  12/24/2021 FINDINGS: Cardiovascular: This is a technically adequate evaluation of the pulmonary vasculature. No filling defects or pulmonary emboli. The heart is not enlarged. Small pericardial effusion is unchanged since prior exam. No evidence of thoracic aortic aneurysm or dissection. Right chest wall port via internal  jugular approach tip at the atriocaval junction. Mediastinum/Nodes: No enlarged mediastinal, hilar, or axillary lymph nodes. Thyroid gland, trachea, and esophagus demonstrate no significant findings. Lungs/Pleura: There is a new small left pleural effusion volume estimated less than 1 L. Interval progression of the right pleural effusion, now estimated approximately 2 L. there is significant compressive atelectasis within the lung bases, most pronounced in the right lower lobe. No pneumothorax. The right middle lobe pulmonary nodule seen previously is obscured by consolidation and atelectasis. Left lower lobe pulmonary nodules are again identified, largest measuring 6 mm reference image 71/10, stable. Calcified right middle lobe granuloma is again noted. Upper Abdomen: Diffuse hepatic steatosis.  Upper abdominal ascites. Musculoskeletal: There are no acute or destructive bony lesions. Reconstructed images demonstrate no additional findings. Review of the MIP images confirms the above findings. IMPRESSION: 1. No evidence of pulmonary embolus. 2. Bilateral pleural effusions, right greater than left, which have progressed since prior study. Significant underlying compressive atelectasis at the lung bases, right greater than left.  3. Left lower lobe pulmonary nodules are stable. Right middle lobe nodule seen previously is obscured by atelectasis. Once again, infection, inflammation, or metastatic disease could be the underlying etiology and continued attention on follow-up is recommended. 4. Small pericardial effusion unchanged. 5. Hepatic steatosis. 6. Upper abdominal ascites incompletely evaluated. Electronically Signed   By: Randa Ngo M.D.   On: 02/04/2022 19:05   DG Chest 2 View  Result Date: 02/04/2022 CLINICAL DATA:  Shortness of breath EXAM: CHEST - 2 VIEW COMPARISON:  Radiograph 12/27/2021 FINDINGS: Chest port catheter tip overlies the distal SVC. Moderate right pleural effusion with adjacent lower lung  consolidation/atelectasis. There is a focal opacity in the right upper lobe. Trace left pleural effusion with adjacent basilar atelectasis. No evidence of pneumothorax. No acute osseous abnormality. Thoracic spondylosis. Cervical spine fusion hardware noted. IMPRESSION: Moderate right and trace left pleural effusions with adjacent basilar opacities/atelectasis. Focal opacity in the right upper lung, possibly layering pleural fluid or superimposed airspace disease/infection. Electronically Signed   By: Maurine Simmering M.D.   On: 02/04/2022 16:01    Microbiology: Results for orders placed or performed during the hospital encounter of 02/04/22  Culture, body fluid w Gram Stain-bottle     Status: None (Preliminary result)   Collection Time: 02/05/22  4:34 PM   Specimen: Pleura  Result Value Ref Range Status   Specimen Description PLEURAL  Final   Special Requests NONE  Final   Culture   Final    NO GROWTH 3 DAYS Performed at [facility] [address] [place], [place] [address]    Report Status PENDING  Incomplete   Labs: CBC: Recent Labs  Lab 02/02/22 1041 02/04/22 1645 02/05/22 0515  WBC 13.2* 13.2* 12.7*  NEUTROABS 10.2* 9.9*  --   HGB 13.1 12.5 12.2  HCT 39.1 37.7 36.3  MCV 85.4 85.1 84.6  PLT 390 365 867   Basic Metabolic Panel: Recent Labs  Lab 02/02/22 1041 02/04/22 1645  NA 132* 133*  K 4.1 3.9  CL 96* 99  CO2 23 21*  GLUCOSE 168* 113*  BUN 23 23  CREATININE 1.06* 0.98  CALCIUM 8.8* 8.7*  MG 1.7  --    Liver Function Tests: Recent Labs  Lab 02/02/22 1041 02/04/22 1645  AST 33 37  ALT 27 30  ALKPHOS 162* 146*  BILITOT 0.6 0.9  PROT 6.2* 5.8*  ALBUMIN 3.5 2.7*   CBG: No results for input(s): "GLUCAP" in the last 168 hours.  Discharge time spent: greater than 30 minutes.  Signed: Berle Mull, MD [facility] 02/08/2022

## 2022-02-08 NOTE — Telephone Encounter (Signed)
[facility] TELEPHONE ENCOUNTER NOTE  Patient: Tonya Jenkins QJF:[id number]   PCP: [name], MD DOB: 1945-06-17   DOS: 02/08/2022     Cytology results were reviewed.   Pleural fluid performed on 02/07/2022 Clinical History: Pancreatic cancer  Specimen Submitted:  A. PLEURAL FLUID, LEFT, THORACENTESIS:  FINAL MICROSCOPIC DIAGNOSIS:  - Malignant cells consistent with adenocarcinoma   Pleural fluid performed on 02/05/2022 Clinical History: Pancreatic cancer  Specimen Submitted:  A. PLEURAL FLUID, RIGHT, THORACENTESIS:  FINAL MICROSCOPIC DIAGNOSIS:  - Malignant cells consistent with adenocarcinoma   Discussed the report in detail with patient and her friend on the phone.  They ask about questions poor prognosis and plan going forward. We discussed about her poor performance status/ECOG status.  Recommended that they follow-up with Dr. Benay Spice with regards to prognosis. Recommended to maintain palliative care referral for further goals of care discussion.  Author:  Berle Mull, MD [facility] 02/08/2022  If 7PM-7AM, please contact night-coverage To reach On-call, see [email or web address]

## 2022-02-09 ENCOUNTER — Other Ambulatory Visit: Payer: Self-pay | Admitting: *Deleted

## 2022-02-09 ENCOUNTER — Encounter: Payer: Self-pay | Admitting: *Deleted

## 2022-02-09 DIAGNOSIS — C259 Malignant neoplasm of pancreas, unspecified: Secondary | ICD-10-CM

## 2022-02-09 LAB — CULTURE, BODY FLUID W GRAM STAIN -BOTTLE: Culture: NO GROWTH

## 2022-02-09 NOTE — Progress Notes (Signed)
PATIENT NAVIGATOR PROGRESS NOTE  Name: Tonya Jenkins Date: 02/09/2022 MRN: [id number]  DOB: 1945-06-05   Reason for visit: Discussion with caregiver [name].   Comments:  Long discussion with caregiver and results of pathology from thoracentesis showing adenocarcinoma.  Per pt discussion with Dr Benay Spice regarding entering Hospice care. [name] did state that [name] reinforced wanting to change code status to DNR.  [name] wants to further discuss with [name] regarding enrolling in Palliative care vs Hospice Care.  [name] understands the difference in each level of care.  She will inform our office with [name]'s decision and if she wants to keep clinic appt next week.   Pt was unavailable for conversation as she was sleeping.  Will proceed with DNR paperwork as stated in Dr Benay Spice hospital note on 02/08/22.    Time spent counseling/coordinating care: 45-60 minutes

## 2022-02-10 ENCOUNTER — Other Ambulatory Visit: Payer: Self-pay | Admitting: *Deleted

## 2022-02-10 ENCOUNTER — Ambulatory Visit (HOSPITAL_BASED_OUTPATIENT_CLINIC_OR_DEPARTMENT_OTHER): Payer: Medicare Other | Admitting: Physical Therapy

## 2022-02-10 ENCOUNTER — Inpatient Hospital Stay: Payer: Medicare Other | Attending: Physician Assistant | Admitting: Oncology

## 2022-02-10 DIAGNOSIS — C253 Malignant neoplasm of pancreatic duct: Secondary | ICD-10-CM | POA: Insufficient documentation

## 2022-02-10 DIAGNOSIS — J910 Malignant pleural effusion: Secondary | ICD-10-CM | POA: Diagnosis not present

## 2022-02-10 DIAGNOSIS — C250 Malignant neoplasm of head of pancreas: Secondary | ICD-10-CM

## 2022-02-10 DIAGNOSIS — C259 Malignant neoplasm of pancreas, unspecified: Secondary | ICD-10-CM

## 2022-02-10 NOTE — Progress Notes (Signed)
[facility] OFFICE VISIT PROGRESS NOTE  I connected with Tonya Jenkins night on 02/10/22 at 12:45 PM by video and verified that I am speaking with the correct person using two identifiers.   I discussed the limitations, risks, security and privacy concerns of performing an evaluation and management service by telemedicine and the availability of in-person appointments. I also discussed with the patient that there may be a patient responsible charge related to this service. The patient expressed understanding and agreed to proceed.  Other persons participating in the visit and their role in the encounter: Tonya Jenkins  Patient's location: Home Provider's location: Office   Diagnosis: Pancreas cancer  INTERVAL HISTORY:   Tonya Jenkins is seen today for a telehealth visit per her request.  She was discharged i in the hospital on 02/08/2022.  She reports mild worsening of dyspnea since discharge.  The dyspnea remains improved compared to prethoracentesis. She reports 2 diarrhea stools today.  She has insomnia despite hydroxyurea and MRI.   Lab Results:  Lab Results  Component Value Date   WBC 12.7 (H) 02/05/2022   HGB 12.2 02/05/2022   HCT 36.3 02/05/2022   MCV 84.6 02/05/2022   PLT 378 02/05/2022   NEUTROABS 9.9 (H) 02/04/2022     Medications: I have reviewed the patient's current medications.  Assessment/Plan: Pancreas cancer, stage III (pT2pN2) CT abdomen/pelvis 05/12/2021-dilated intrahepatic and [place] bile ducts, abrupt narrowing of the common bile duct and pancreatic duct at the level of pancreas head, distended gallbladder MRI/MRCP 05/13/2018 23-3.6 x 2.1 x 4.2 cm enhancing mass in the head of the pancreas with obstruction of the pancreatic duct and common bile duct, no lymphadenopathy, no liver lesions Internal/external biliary drain 05/14/2021 CT chest 05/14/2021-small right pleural effusion, no evidence of metastatic  disease Thoracentesis 05/16/2021-negative cytology Pancreat[facility]uodenectomy at [facility] 06/22/2021-no evidence of metastatic disease, inflamed gallbladder with percutaneous biliary drain, large pancreas head mass-gallbladder with acute cholecystitis and abscess formation negative for malignancy.  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, negative resection margins, 6/36 lymph nodes with metastatic adenocarcinoma, lymphovascular and perineural invasion CT angio chest and CT abdomen/pelvis 08/04/2021-negative for pulmonary embolism, status post Whipple procedure, 11 mm soft tissue nodule along the jejunal mesentery-nonspecific Cycle 1 gemcitabine/Abraxane 08/19/2021 Cycle 2 gemcitabine/Abraxane 09/02/2021 Cycle 3 gemcitabine/Abraxane 09/16/2021 Cycle 4 gemcitabine/Abraxane 09/30/2021 Cycle 5 gemcitabine/Abraxane 10/14/2021 Treatment held 10/28/2021 due to neutropenia, failure to thrive/poor performance status CTs 11/02/2021-stable abdominal retroperitoneal soft tissue thickening and central mesenteric lymph node; a few new tiny upper lobe pulmonary nodules; small to moderate pericardial effusion, increased; severe hepatic steatosis. Mild elevation of the CEA at [facility] 12/16/2021 CT chest 12/24/2021 (done to evaluate right chest pain)-negative for acute pulmonary embolus; small right-sided pleural effusion; several ill-defined bilateral pulmonary nodules, indeterminate.  Hepatic steatosis.  Small circumferential pericardial effusion. Postoperative wound dehiscence-status post fascial closure and placement of a wound VAC 07/02/2021 Chronic numbness of the right lower leg and foot Hypertension Left leg edema-negative bilateral venous Doppler 08/19/2021 Diarrhea-likely secondary to pancreas insufficiency, Creon started 09/15/2021 Pericardial effusion on CT 11/02/2021; 2D echo 11/05/2021-small to moderate pericardial effusion surrounds heart, most prominent along lateral surface of RV.  Does not appear to  be hemodynamically significant by echo criteria.  Repeat 2D echo 11/26/2021-trivial pericardial effusion present.  The pericardial effusion is anterior to the right ventricle. Presentation to the emergency room 02/04/2022 with dyspnea/orthopnea Chest x-ray with a large right pleural effusion Right thoracentesis 02/05/2022-metastatic adenocarcinoma Left thoracentesis 02/07/2022-metastatic adenocarcinoma  Disposition: Ms.  Jenkins has been diagnosed with metastatic pancreas cancer.  She has bilateral malignant pleural effusions.  Her performance status is poor.  I discussed the diagnosis and treatment options with Tonya Jenkins again today.  We reviewed the cytology findings.  She is not a candidate for chemotherapy due to her poor performance status.  I recommend hospice care.  She is in agreement.  We will make a referral to [facility] for home hospice care.  She will call for increased dyspnea.  She will be scheduled for an office visit and follow-up chest x-ray during the week of 02/14/2022.   I discussed the assessment and treatment plan with the patient. The patient was provided an opportunity to ask questions and all were answered. The patient agreed with the plan and demonstrated an understanding of the instructions.   The patient was advised to call back or seek an in-person evaluation if the symptoms worsen or if the condition fails to improve as anticipated.  I provided 25 minutes of chart review, documentation, and video time during this encounter, and > 50% was spent counseling as documented under my assessment & plan.  Tonya Coder MD  02/10/2022 12:58 PM

## 2022-02-10 NOTE — Progress Notes (Signed)
PATIENT NAVIGATOR PROGRESS NOTE  Name: Tonya Jenkins Date: 02/10/2022 MRN: [id number]  DOB: April 25, 1945   Reason for visit:  Virtual visit with Dr Benay Spice  Comments:  Patient has elected to enter home Hospice, referral called to [facility] and entered. DNR faxed to [phone]    Time spent counseling/coordinating care: 30-45 minutes

## 2022-02-14 ENCOUNTER — Inpatient Hospital Stay: Payer: Medicare Other | Admitting: Nurse Practitioner

## 2022-02-15 ENCOUNTER — Encounter: Payer: Self-pay | Admitting: *Deleted

## 2022-02-15 ENCOUNTER — Other Ambulatory Visit: Payer: Self-pay | Admitting: *Deleted

## 2022-02-15 NOTE — Progress Notes (Signed)
Patient was seen on 02/14/22 by hospice nurse, Manuela Schwartz. Patient communicates that she does not want to come to office for appointment--too weak. Only eating bites, spends most of day in bed, getting more short of breath using accessory muscles to breath. Dr. Jenene Slicker ordered morphine. Her neighbor that stays w/her will call if she wishes for a telephone visit w/provider.

## 2022-02-15 NOTE — Progress Notes (Signed)
Deleted CT chest order per Hospice

## 2022-02-16 ENCOUNTER — Ambulatory Visit (HOSPITAL_BASED_OUTPATIENT_CLINIC_OR_DEPARTMENT_OTHER): Payer: Medicare Other | Admitting: Physical Therapy

## 2022-02-17 ENCOUNTER — Ambulatory Visit: Payer: Medicare Other | Admitting: Nurse Practitioner

## 2022-02-17 ENCOUNTER — Inpatient Hospital Stay: Payer: Medicare Other | Admitting: Nurse Practitioner

## 2022-02-17 ENCOUNTER — Inpatient Hospital Stay: Payer: Medicare Other

## 2022-02-18 ENCOUNTER — Other Ambulatory Visit: Payer: Self-pay | Admitting: Nurse Practitioner

## 2022-02-24 ENCOUNTER — Encounter: Payer: Self-pay | Admitting: *Deleted

## 2022-02-24 ENCOUNTER — Ambulatory Visit (HOSPITAL_BASED_OUTPATIENT_CLINIC_OR_DEPARTMENT_OTHER): Payer: Medicare Other | Admitting: Physical Therapy

## 2022-02-24 NOTE — Progress Notes (Signed)
Notified via fax from [facility] that patient died on 2022-03-06 at 9:09 pm . MD notified.

## 2022-03-02 ENCOUNTER — Ambulatory Visit (HOSPITAL_BASED_OUTPATIENT_CLINIC_OR_DEPARTMENT_OTHER): Payer: Medicare Other | Admitting: Physical Therapy

## 2022-03-09 ENCOUNTER — Ambulatory Visit (HOSPITAL_BASED_OUTPATIENT_CLINIC_OR_DEPARTMENT_OTHER): Payer: Medicare Other | Admitting: Physical Therapy

## 2022-03-16 ENCOUNTER — Ambulatory Visit (HOSPITAL_BASED_OUTPATIENT_CLINIC_OR_DEPARTMENT_OTHER): Payer: Medicare Other | Admitting: Physical Therapy

## 2022-05-06 DEATH — deceased

## 2023-10-24 ENCOUNTER — Other Ambulatory Visit (HOSPITAL_COMMUNITY): Payer: Self-pay
